# Patient Record
Sex: Male | Born: 1965
Health system: Southern US, Community
[De-identification: ages and names within clinical notes are randomized; demographics above are authoritative.]

## PROBLEM LIST (undated history)

## (undated) DIAGNOSIS — R413 Other amnesia: Secondary | ICD-10-CM

## (undated) DIAGNOSIS — M1990 Unspecified osteoarthritis, unspecified site: Secondary | ICD-10-CM

## (undated) DIAGNOSIS — B20 Human immunodeficiency virus [HIV] disease: Secondary | ICD-10-CM

## (undated) DIAGNOSIS — B159 Hepatitis A without hepatic coma: Secondary | ICD-10-CM

## (undated) DIAGNOSIS — Z923 Personal history of irradiation: Secondary | ICD-10-CM

## (undated) DIAGNOSIS — R7989 Other specified abnormal findings of blood chemistry: Secondary | ICD-10-CM

## (undated) DIAGNOSIS — E119 Type 2 diabetes mellitus without complications: Secondary | ICD-10-CM

## (undated) DIAGNOSIS — F32A Depression, unspecified: Secondary | ICD-10-CM

## (undated) DIAGNOSIS — G629 Polyneuropathy, unspecified: Secondary | ICD-10-CM

## (undated) DIAGNOSIS — E785 Hyperlipidemia, unspecified: Secondary | ICD-10-CM

## (undated) DIAGNOSIS — F4310 Post-traumatic stress disorder, unspecified: Secondary | ICD-10-CM

## (undated) DIAGNOSIS — C8190 Hodgkin lymphoma, unspecified, unspecified site: Secondary | ICD-10-CM

## (undated) DIAGNOSIS — K625 Hemorrhage of anus and rectum: Secondary | ICD-10-CM

## (undated) DIAGNOSIS — F419 Anxiety disorder, unspecified: Secondary | ICD-10-CM

## (undated) DIAGNOSIS — T148XXA Other injury of unspecified body region, initial encounter: Secondary | ICD-10-CM

## (undated) DIAGNOSIS — R634 Abnormal weight loss: Secondary | ICD-10-CM

## (undated) DIAGNOSIS — I10 Essential (primary) hypertension: Secondary | ICD-10-CM

## (undated) DIAGNOSIS — E781 Pure hyperglyceridemia: Secondary | ICD-10-CM

## (undated) HISTORY — DX: Pure hyperglyceridemia: E78.1

## (undated) HISTORY — DX: Other specified abnormal findings of blood chemistry: R79.89

## (undated) HISTORY — DX: Abnormal weight loss: R63.4

## (undated) HISTORY — DX: Essential (primary) hypertension: I10

## (undated) HISTORY — PX: TONSILLECTOMY: SUR1361

## (undated) HISTORY — DX: Type 2 diabetes mellitus without complications: E11.9

## (undated) HISTORY — DX: Other amnesia: R41.3

## (undated) HISTORY — DX: Other injury of unspecified body region, initial encounter: T14.8XXA

## (undated) HISTORY — DX: Hepatitis a without hepatic coma: B15.9

## (undated) HISTORY — DX: Post-traumatic stress disorder, unspecified: F43.10

## (undated) HISTORY — DX: Anxiety disorder, unspecified: F41.9

## (undated) HISTORY — DX: Personal history of irradiation: Z92.3

## (undated) HISTORY — DX: Unspecified osteoarthritis, unspecified site: M19.90

## (undated) HISTORY — DX: Depression, unspecified: F32.A

## (undated) HISTORY — DX: Hodgkin lymphoma, unspecified, unspecified site: C81.90

## (undated) HISTORY — DX: Hyperlipidemia, unspecified: E78.5

## (undated) HISTORY — DX: Hemorrhage of anus and rectum: K62.5

## (undated) HISTORY — PX: OTHER SURGICAL HISTORY: SHX169

## (undated) HISTORY — DX: Human immunodeficiency virus (HIV) disease: B20

## (undated) HISTORY — DX: Polyneuropathy, unspecified: G62.9

---

## 2000-11-14 ENCOUNTER — Observation Stay (HOSPITAL_COMMUNITY): Admission: EM | Admit: 2000-11-14 | Discharge: 2000-11-15 | Payer: Self-pay | Admitting: Emergency Medicine

## 2000-11-14 ENCOUNTER — Encounter: Payer: Self-pay | Admitting: Emergency Medicine

## 2009-10-26 LAB — CONVERTED CEMR LAB
CD4 T Helper %: 14.1 %
Hemoglobin: 14.4 g/dL
Platelets: 247 10*3/uL

## 2009-11-30 ENCOUNTER — Ambulatory Visit: Payer: Self-pay | Admitting: Internal Medicine

## 2009-11-30 DIAGNOSIS — B20 Human immunodeficiency virus [HIV] disease: Secondary | ICD-10-CM | POA: Insufficient documentation

## 2009-11-30 DIAGNOSIS — I10 Essential (primary) hypertension: Secondary | ICD-10-CM

## 2009-11-30 DIAGNOSIS — G4700 Insomnia, unspecified: Secondary | ICD-10-CM | POA: Insufficient documentation

## 2009-11-30 DIAGNOSIS — R634 Abnormal weight loss: Secondary | ICD-10-CM | POA: Insufficient documentation

## 2009-11-30 DIAGNOSIS — F17200 Nicotine dependence, unspecified, uncomplicated: Secondary | ICD-10-CM | POA: Insufficient documentation

## 2009-11-30 DIAGNOSIS — F341 Dysthymic disorder: Secondary | ICD-10-CM | POA: Insufficient documentation

## 2009-11-30 HISTORY — DX: Essential (primary) hypertension: I10

## 2009-11-30 HISTORY — DX: Human immunodeficiency virus (HIV) disease: B20

## 2009-11-30 LAB — CONVERTED CEMR LAB
ALT: 40 units/L (ref 0–53)
AST: 44 units/L — ABNORMAL HIGH (ref 0–37)
Albumin: 4.3 g/dL (ref 3.5–5.2)
Alkaline Phosphatase: 55 units/L (ref 39–117)
BUN: 13 mg/dL (ref 6–23)
Basophils Absolute: 0 10*3/uL (ref 0.0–0.1)
Basophils Relative: 1 % (ref 0–1)
Bilirubin Urine: NEGATIVE
CO2: 18 meq/L — ABNORMAL LOW (ref 19–32)
Calcium: 9.4 mg/dL (ref 8.4–10.5)
Chlamydia, Swab/Urine, PCR: NEGATIVE
Chloride: 104 meq/L (ref 96–112)
Cholesterol: 143 mg/dL (ref 0–200)
Creatinine, Ser: 0.89 mg/dL (ref 0.40–1.50)
Eosinophils Absolute: 0.1 10*3/uL (ref 0.0–0.7)
Eosinophils Relative: 2 % (ref 0–5)
GC Probe Amp, Urine: NEGATIVE
Glucose, Bld: 98 mg/dL (ref 70–99)
HCT: 43.2 % (ref 39.0–52.0)
HCV Ab: NEGATIVE
HDL: 26 mg/dL — ABNORMAL LOW (ref >39)
HIV 1 RNA Quant: 19100 copies/mL — ABNORMAL HIGH (ref <48)
HIV-1 RNA Quant, Log: 4.28 — ABNORMAL HIGH (ref <1.68)
HIV-1 antibody: POSITIVE — AB
HIV-2 Ab: NEGATIVE
HIV: REACTIVE
Hemoglobin, Urine: NEGATIVE
Hemoglobin: 14.2 g/dL (ref 13.0–17.0)
Hep A Total Ab: POSITIVE — AB
Hep B Core Total Ab: POSITIVE — AB
Hep B S Ab: POSITIVE — AB
Hepatitis B Surface Ag: NEGATIVE
Ketones, ur: NEGATIVE mg/dL
LDL Cholesterol: 53 mg/dL (ref 0–99)
Leukocytes, UA: NEGATIVE
Lymphocytes Relative: 50 % — ABNORMAL HIGH (ref 12–46)
Lymphs Abs: 3.2 10*3/uL (ref 0.7–4.0)
MCHC: 32.9 g/dL (ref 30.0–36.0)
MCV: 89.3 fL (ref 78.0–100.0)
Monocytes Absolute: 0.5 10*3/uL (ref 0.1–1.0)
Monocytes Relative: 8 % (ref 3–12)
Neutro Abs: 2.5 10*3/uL (ref 1.7–7.7)
Neutrophils Relative %: 40 % — ABNORMAL LOW (ref 43–77)
Nitrite: NEGATIVE
Platelets: 257 10*3/uL (ref 150–400)
Potassium: 4.8 meq/L (ref 3.5–5.3)
Protein, ur: NEGATIVE mg/dL
RBC: 4.84 M/uL (ref 4.22–5.81)
RDW: 13.7 % (ref 11.5–15.5)
Sodium: 138 meq/L (ref 135–145)
Specific Gravity, Urine: 1.017 (ref 1.005–1.030)
Total Bilirubin: 0.4 mg/dL (ref 0.3–1.2)
Total CHOL/HDL Ratio: 5.5
Total Protein: 7.8 g/dL (ref 6.0–8.3)
Triglycerides: 322 mg/dL — ABNORMAL HIGH (ref <150)
Urine Glucose: NEGATIVE mg/dL
Urobilinogen, UA: 0.2 (ref 0.0–1.0)
VLDL: 64 mg/dL — ABNORMAL HIGH (ref 0–40)
WBC: 6.3 10*3/uL (ref 4.0–10.5)
pH: 5 (ref 5.0–8.0)

## 2009-12-15 ENCOUNTER — Ambulatory Visit: Payer: Self-pay | Admitting: Internal Medicine

## 2009-12-29 ENCOUNTER — Ambulatory Visit: Payer: Self-pay | Admitting: Internal Medicine

## 2009-12-29 DIAGNOSIS — H6690 Otitis media, unspecified, unspecified ear: Secondary | ICD-10-CM | POA: Insufficient documentation

## 2010-02-28 ENCOUNTER — Telehealth: Payer: Self-pay | Admitting: Internal Medicine

## 2010-02-28 ENCOUNTER — Ambulatory Visit: Payer: Self-pay | Admitting: Internal Medicine

## 2010-02-28 LAB — CONVERTED CEMR LAB
ALT: 33 units/L (ref 0–53)
AST: 33 units/L (ref 0–37)
Albumin: 4.1 g/dL (ref 3.5–5.2)
Alkaline Phosphatase: 51 units/L (ref 39–117)
BUN: 12 mg/dL (ref 6–23)
Basophils Absolute: 0 10*3/uL (ref 0.0–0.1)
Basophils Relative: 0 % (ref 0–1)
CO2: 26 meq/L (ref 19–32)
Calcium: 9.3 mg/dL (ref 8.4–10.5)
Chloride: 103 meq/L (ref 96–112)
Creatinine, Ser: 0.89 mg/dL (ref 0.40–1.50)
Eosinophils Absolute: 0.1 10*3/uL (ref 0.0–0.7)
Eosinophils Relative: 3 % (ref 0–5)
Glucose, Bld: 134 mg/dL — ABNORMAL HIGH (ref 70–99)
HCT: 43.7 % (ref 39.0–52.0)
HIV 1 RNA Quant: 26800 copies/mL — ABNORMAL HIGH (ref <48)
HIV-1 RNA Quant, Log: 4.43 — ABNORMAL HIGH (ref <1.68)
Hemoglobin: 14.4 g/dL (ref 13.0–17.0)
Lymphocytes Relative: 50 % — ABNORMAL HIGH (ref 12–46)
Lymphs Abs: 2.9 10*3/uL (ref 0.7–4.0)
MCHC: 33 g/dL (ref 30.0–36.0)
MCV: 89.2 fL (ref 78.0–100.0)
Monocytes Absolute: 0.5 10*3/uL (ref 0.1–1.0)
Monocytes Relative: 8 % (ref 3–12)
Neutro Abs: 2.2 10*3/uL (ref 1.7–7.7)
Neutrophils Relative %: 39 % — ABNORMAL LOW (ref 43–77)
Platelets: 210 10*3/uL (ref 150–400)
Potassium: 4.3 meq/L (ref 3.5–5.3)
RBC: 4.9 M/uL (ref 4.22–5.81)
RDW: 13.8 % (ref 11.5–15.5)
Sodium: 139 meq/L (ref 135–145)
Total Bilirubin: 0.4 mg/dL (ref 0.3–1.2)
Total Protein: 7.5 g/dL (ref 6.0–8.3)
WBC: 5.7 10*3/uL (ref 4.0–10.5)

## 2010-03-21 ENCOUNTER — Ambulatory Visit: Payer: Self-pay | Admitting: Infectious Disease

## 2010-06-20 ENCOUNTER — Telehealth: Payer: Self-pay | Admitting: Internal Medicine

## 2010-06-20 ENCOUNTER — Ambulatory Visit: Payer: Self-pay | Admitting: Internal Medicine

## 2010-06-20 LAB — CONVERTED CEMR LAB
ALT: 30 units/L (ref 0–53)
AST: 28 units/L (ref 0–37)
Albumin: 4.4 g/dL (ref 3.5–5.2)
Alkaline Phosphatase: 52 units/L (ref 39–117)
BUN: 13 mg/dL (ref 6–23)
Basophils Absolute: 0 10*3/uL (ref 0.0–0.1)
Basophils Relative: 0 % (ref 0–1)
CO2: 29 meq/L (ref 19–32)
Calcium: 9.6 mg/dL (ref 8.4–10.5)
Chloride: 101 meq/L (ref 96–112)
Creatinine, Ser: 1.02 mg/dL (ref 0.40–1.50)
Eosinophils Absolute: 0.2 10*3/uL (ref 0.0–0.7)
Eosinophils Relative: 3 % (ref 0–5)
Glucose, Bld: 106 mg/dL — ABNORMAL HIGH (ref 70–99)
HCT: 43.5 % (ref 39.0–52.0)
HIV 1 RNA Quant: <20 copies/mL (ref <20)
HIV-1 RNA Quant, Log: <1.3 (ref <1.30)
Hemoglobin: 14.6 g/dL (ref 13.0–17.0)
Lymphocytes Relative: 51 % — ABNORMAL HIGH (ref 12–46)
Lymphs Abs: 4 10*3/uL (ref 0.7–4.0)
MCHC: 33.6 g/dL (ref 30.0–36.0)
MCV: 89.3 fL (ref 78.0–100.0)
Monocytes Absolute: 0.6 10*3/uL (ref 0.1–1.0)
Monocytes Relative: 7 % (ref 3–12)
Neutro Abs: 3.2 10*3/uL (ref 1.7–7.7)
Neutrophils Relative %: 40 % — ABNORMAL LOW (ref 43–77)
Platelets: 243 10*3/uL (ref 150–400)
Potassium: 4.1 meq/L (ref 3.5–5.3)
RBC: 4.87 M/uL (ref 4.22–5.81)
RDW: 13.6 % (ref 11.5–15.5)
Sodium: 140 meq/L (ref 135–145)
Total Bilirubin: 0.4 mg/dL (ref 0.3–1.2)
Total Protein: 7.9 g/dL (ref 6.0–8.3)
WBC: 8 10*3/uL (ref 4.0–10.5)

## 2010-07-04 ENCOUNTER — Telehealth (INDEPENDENT_AMBULATORY_CARE_PROVIDER_SITE_OTHER): Payer: Self-pay | Admitting: *Deleted

## 2010-07-05 ENCOUNTER — Ambulatory Visit: Payer: Self-pay | Admitting: Internal Medicine

## 2010-07-07 ENCOUNTER — Encounter (INDEPENDENT_AMBULATORY_CARE_PROVIDER_SITE_OTHER): Payer: Self-pay | Admitting: Licensed Clinical Social Worker

## 2010-08-30 NOTE — Consult Note (Signed)
Summary: New Pt. Referral: [facility] Dept.  New Pt. Referral: [facility] Dept.   Imported By: Florinda Marker 12/30/2009 15:01:50  _____________________________________________________________________  External Attachment:    Type:   Image     Comment:   External Document
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
## 2010-08-30 NOTE — Progress Notes (Signed)
Summary: recheck RPR  Phone Note Call from Patient   Caller: Patient Reason for Call: Talk to Nurse Summary of Call: Pt. was here for lab work and said his primary care physician wanted an RPR rechecked at six months, since he had labs today could that be added? Initial call taken by: Wendall Mola CMA Duncan Dull),  February 28, 2010 9:21 AM  Follow-up for Phone Call        he was negative in May Follow-up by: Yisroel Ramming MD,  February 28, 2010 9:26 AM  Additional Follow-up for Phone Call Additional follow up Details #1::        pt. notified Additional Follow-up by: Wendall Mola CMA Duncan Dull),  February 28, 2010 9:38 AM

## 2010-08-30 NOTE — Miscellaneous (Signed)
  Clinical Lists Changes  Medications: Added new medication of FAMCICLOVIR 250 MG TABS (FAMCICLOVIR)

## 2010-08-30 NOTE — Progress Notes (Addendum)
Summary: PAP application for Isentress  Phone Note Outgoing Call   Call placed by: Annice Pih Summary of Call: [facility] for pt. assistance for Isentress. Was told pt. needed to fill out a new application and RX needed to be mailed.  Pt. has appt. 07/05/10 and he can fill out application at that time. Initial call taken by: Wendall Mola CMA [facility]),  July 04, 2010 11:48 AM     Appended Document: PAP application for Isentress pt. picked up PA for Isentress

## 2010-08-30 NOTE — Progress Notes (Signed)
Summary: Isentress refill for PAP  Phone Note Other Incoming   Caller: [employer] program for Isentress,  Summary of Call: Needing new printed and signed rx to continue PAP rx. [name] RN  June 20, 2010 12:39 PM     Prescriptions: ISENTRESS 400 MG TABS (RALTEGRAVIR POTASSIUM) Take 1 tablet by mouth two times a day  #60 x prn   Entered by:   [name] RN   Authorized by:   Yisroel Ramming MD   Signed by:   [name] RN on 06/20/2010   Method used:   Print then Give to Patient   RxID:   [id number]  to mail to PAP program, [facility].  Fax [phone], tel [phone] [name] RN  June 20, 2010 12:40 PM
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
## 2010-08-30 NOTE — Assessment & Plan Note (Signed)
Summary: SORE THROAT/VS   CC:  pt. c/o sorethroat and left ear blocked and headache x one week.  History of Present Illness: Pt c/o about a week of a sore throat and ear pain bilaterally.  He feels like his left ear is clogged and  hears some ringing in it.  He has a cough but thinks it is due to postnasal drip.  No fever or chills.  He has been using mucinex.  Preventive Screening-Counseling & Management  Alcohol-Tobacco     Alcohol drinks/day: <1     Smoking Status: occasional  Caffeine-Diet-Exercise     Caffeine use/day: coffee and tea     Does Patient Exercise: yes     Type of exercise: treadmill, weights     Times/week: 5  Safety-Violence-Falls     Seat Belt Use: yes      Sexual History:  no.        Drug Use:  No.     Updated Prior Medication List: LEXAPRO 10 MG TABS (ESCITALOPRAM OXALATE) take one daily ZIAC 2.5-6.25 MG TABS (BISOPROLOL-HYDROCHLOROTHIAZIDE) take one daily CLARITIN 10 MG TABS (LORATADINE) take one daily ASPIR-LOW 81 MG TBEC (ASPIRIN) take one daily FISH OIL 1000 MG CAPS (OMEGA-3 FATTY ACIDS)  VITAMIN C CR 500 MG CR-TABS (ASCORBIC ACID)  DOXEPIN HCL 10 MG CAPS (DOXEPIN HCL) take one capsule at bedtime as needed for sleep AUGMENTIN 875-125 MG TABS (AMOXICILLIN-POT CLAVULANATE) Take 1 tablet by mouth two times a day  Current Allergies (reviewed today): No known allergies  Past History:  Past Medical History: Last updated: 12/15/2009 Hypertension  Review of Systems  The patient denies anorexia, fever, chest pain, dyspnea on exertion, and hemoptysis.    Vital Signs:  Patient profile:   45 year old male Height:      72 inches (182.88 cm) Weight:      297.8 pounds (135.36 kg) BMI:     40.53 Temp:     98.0 degrees F (36.67 degrees C) oral Pulse rate:   81 / minute BP sitting:   149 / 88  (left arm)  Vitals Entered By: Wendall Mola CMA Duncan Dull) (December 29, 2009 4:01 PM) CC: pt. c/o sorethroat, left ear blocked and headache x one week Is  Patient Diabetic? No Pain Assessment Patient in pain? yes     Location: head Intensity: 4 Type: aching Onset of pain  Constant Nutritional Status BMI of > 30 = obese Nutritional Status Detail appetite "good"  Does patient need assistance? Functional Status Self care Ambulation Normal Comments no missed doses of meds per patient   Physical Exam  General:  alert, well-developed, well-nourished, and well-hydrated.   Head:  normocephalic and atraumatic.   Ears:  R and L TM slightly erythematous and dull Mouth:  no exudates and pharyngeal erythema.   Lungs:  normal breath sounds.     Impression & Recommendations:  Problem # 1:  OTITIS MEDIA, ACUTE (ICD-382.9) Will treat with augmentin His updated medication list for this problem includes:        Augmentin 875-125 Mg Tabs (Amoxicillin-pot clavulanate) .Marland Kitchen... Take 1 tablet by mouth two times a day  Orders: Est. Patient Research Study [id number])  Medications Added to Medication List This Visit: 1)  Augmentin 875-125 Mg Tabs (Amoxicillin-pot clavulanate) .... Take 1 tablet by mouth two times a day Prescriptions: AUGMENTIN 875-125 MG TABS (AMOXICILLIN-POT CLAVULANATE) Take 1 tablet by mouth two times a day  #20 x 0   Entered and Authorized by:   Tresa Endo  Huckleberry Martinson MD   Signed by:   Yisroel Ramming MD on 12/29/2009   Method used:   Print then Give to Patient   RxID:   [id number]

## 2010-08-30 NOTE — Miscellaneous (Signed)
Summary: HIPAA Restrictions  HIPAA Restrictions   Imported By: Florinda Marker 11/30/2009 15:52:24  _____________________________________________________________________  External Attachment:    Type:   Image     Comment:   External Document

## 2010-09-12 ENCOUNTER — Encounter (INDEPENDENT_AMBULATORY_CARE_PROVIDER_SITE_OTHER): Payer: Self-pay | Admitting: *Deleted

## 2010-09-15 ENCOUNTER — Encounter: Payer: Self-pay | Admitting: Adult Health

## 2010-09-19 ENCOUNTER — Telehealth (INDEPENDENT_AMBULATORY_CARE_PROVIDER_SITE_OTHER): Payer: Self-pay | Admitting: *Deleted

## 2010-09-21 ENCOUNTER — Other Ambulatory Visit (INDEPENDENT_AMBULATORY_CARE_PROVIDER_SITE_OTHER): Payer: 59

## 2010-09-21 ENCOUNTER — Encounter: Payer: Self-pay | Admitting: Adult Health

## 2010-09-21 ENCOUNTER — Other Ambulatory Visit: Payer: Self-pay | Admitting: Adult Health

## 2010-09-21 DIAGNOSIS — B20 Human immunodeficiency virus [HIV] disease: Secondary | ICD-10-CM

## 2010-09-21 LAB — CONVERTED CEMR LAB
Albumin: 4.3 g/dL (ref 3.5–5.2)
Alkaline Phosphatase: 53 units/L (ref 39–117)
Basophils Absolute: 0 10*3/uL (ref 0.0–0.1)
Basophils Relative: 0 % (ref 0–1)
Calcium: 9 mg/dL (ref 8.4–10.5)
Chloride: 99 meq/L (ref 96–112)
Eosinophils Absolute: 0.1 10*3/uL (ref 0.0–0.7)
Glucose, Bld: 102 mg/dL — ABNORMAL HIGH (ref 70–99)
HIV 1 RNA Quant: <20 copies/mL (ref <20)
HIV-1 RNA Quant, Log: <1.3 (ref <1.30)
MCHC: 33.4 g/dL (ref 30.0–36.0)
MCV: 90.6 fL (ref 78.0–100.0)
Monocytes Absolute: 0.9 10*3/uL (ref 0.1–1.0)
Monocytes Relative: 10 % (ref 3–12)
Neutrophils Relative %: 50 % (ref 43–77)
RBC: 4.46 M/uL (ref 4.22–5.81)
Sodium: 137 meq/L (ref 135–145)
Total Bilirubin: 0.5 mg/dL (ref 0.3–1.2)
Total Protein: 7.2 g/dL (ref 6.0–8.3)

## 2010-09-21 NOTE — Miscellaneous (Signed)
  Clinical Lists Changes  Observations: Added new observation of HOUSING: Stable/permanent (09/12/2010 15:51)

## 2010-09-22 LAB — T-HELPER CELL (CD4) - (RCID CLINIC ONLY)
CD4 % Helper T Cell: 23 % — ABNORMAL LOW (ref 33–55)
CD4 T Cell Abs: 720 uL (ref 400–2700)

## 2010-09-27 NOTE — Progress Notes (Signed)
Summary: PAP Isentress for pick-up.  Phone Note Outgoing Call   Call placed by: [name] RN,  September 19, 2010 10:26 AM Call placed to: Patient Action Taken: Assistance medications ready for pick up Summary of Call: Pt. stated he will pick up on Wed., Feb. 22, 2012 when he comes in for his lab work. [name] RN  September 19, 2010 10:33 AM     Prescriptions: ISENTRESS 400 MG TABS (RALTEGRAVIR POTASSIUM) Take 1 tablet by mouth two times a day  #60 x 2   Entered by:   [name] RN   Authorized by:   Johny Sax MD   Signed by:   [name] RN on 09/19/2010   Method used:   Samples Given   RxID:   [id number]  Discard after 09/13/2011. [name] RN  September 19, 2010 10:32 AM

## 2010-09-27 NOTE — Letter (Signed)
Summary: Support Program  Support Program   Imported By: Florinda Marker 09/21/2010 09:46:18  _____________________________________________________________________  External Attachment:    Type:   Image     Comment:   External Document

## 2010-10-05 ENCOUNTER — Encounter: Payer: Self-pay | Admitting: Infectious Diseases

## 2010-10-05 ENCOUNTER — Ambulatory Visit (INDEPENDENT_AMBULATORY_CARE_PROVIDER_SITE_OTHER): Payer: 59 | Admitting: Infectious Diseases

## 2010-10-05 DIAGNOSIS — B20 Human immunodeficiency virus [HIV] disease: Secondary | ICD-10-CM

## 2010-10-10 ENCOUNTER — Telehealth (INDEPENDENT_AMBULATORY_CARE_PROVIDER_SITE_OTHER): Payer: Self-pay | Admitting: *Deleted

## 2010-10-11 NOTE — Assessment & Plan Note (Signed)
Summary: new to md 50month f/u [mkj]   Vital Signs:  Patient profile:   45 year old male Height:      72 inches (182.88 cm) Weight:      296 pounds (134.55 kg) BMI:     40.29 Temp:     98.9 degrees F (37.17 degrees C) oral Pulse rate:   79 / minute BP sitting:   148 / 89  (left arm)  Vitals Entered By: Starleen Arms CMA (October 05, 2010 3:27 PM) CC: 3 month f/u Is Patient Diabetic? No Pain Assessment Patient in pain? no      Nutritional Status BMI of > 30 = obese Nutritional Status Detail eating more  Does patient need assistance? Functional Status Self care Ambulation Normal   Primary Provider:  Vito Berger MD  CC:  3 month f/u.  History of Present Illness: 45 yo M with HIV+ 09-2009 as well as G190A mutation. He was started on ISN/TRV at f/u. Last CD4 720 VL <20 (09-21-2010). Has been taking Zi+ and Vitamin D3 as well. Was recently treated for pneumonia (tamiflu, antibiotics and 2 inhalers).   Preventive Screening-Counseling & Management  Alcohol-Tobacco     Alcohol drinks/day: 0     Smoking Status: never  Current Medications (verified): 1)  Lexapro 10 Mg Tabs (Escitalopram Oxalate) .... Take One Daily 2)  Ziac 5-6.25 Mg Tabs (Bisoprolol-Hydrochlorothiazide) .Marland Kitchen.. 1 Daily 3)  Claritin 10 Mg Tabs (Loratadine) .... Take One Daily 4)  Aspir-Low 81 Mg Tbec (Aspirin) .... Take One Daily 5)  Vitamin C Cr 500 Mg Cr-Tabs (Ascorbic Acid) 6)  Doxepin Hcl 10 Mg Caps (Doxepin Hcl) .... Take One Capsule At Bedtime As Needed For Sleep 7)  Truvada 200-300 Mg Tabs (Emtricitabine-Tenofovir) .... Take 1 Tablet By Mouth Once A Day 8)  Isentress 400 Mg Tabs (Raltegravir Potassium) .... Take 1 Tablet By Mouth Two Times A Day  Allergies: 1)  ! * Shrimp and Lobster 2)  ! Malon Kindle  Past History:  Past Medical History: Current Problems:  OTITIS MEDIA, ACUTE (ICD-382.9) WEIGHT LOSS, RECENT (ICD-783.21) INSOMNIA (ICD-780.52) SMOKER (ICD-305.1) ANXIETY DEPRESSION  (ICD-300.4) HYPERTENSION (ICD-401.9) HIV INFECTION (ICD-042) ENCOUNTER FOR LONG-TERM USE OF OTHER MEDICATIONS (ICD-V58.69)  Family History: father with brain tumors, CVA  Social History: single, quit tobacco and ETOH  Review of Systems       wt is down 5 # from previous. no change in diet. having frontal headache.   Physical Exam  General:  well-developed, well-nourished, well-hydrated, and overweight-appearing.   Eyes:  pupils equal, pupils round, and pupils reactive to light.   Mouth:  pharynx pink and moist and no exudates.   Neck:  no masses.   Lungs:  normal respiratory effort and normal breath sounds.   Heart:  normal rate, regular rhythm, and no murmur.   Abdomen:  soft, non-tender, and normal bowel sounds.          Medication Adherence: 10/05/2010   Adherence to medications reviewed with patient. Counseling to provide adequate adherence provided   Prevention For Positives: 10/05/2010   Safe sex practices discussed with patient. Condoms offered.                             Impression & Recommendations:  Problem # 1:  HIV INFECTION (ICD-042)  he is doing very well. no change in his meds. offered condoms. will see him back in 4-5 months with labs before hand. RW  form updated.   The following medications were removed from the medication list:    Famciclovir 250 Mg Tabs (Famciclovir)  Orders: Est. Patient Level IV (99214)Future Orders: T-CD4SP ([facility]) (CD4SP) ... 01/03/2011 T-HIV Viral Load [phone]) ... 01/03/2011 T-Comprehensive Metabolic Panel [phone]) ... 01/03/2011 T-CBC w/Diff ([id number]) ... 01/03/2011 T-RPR (Syphilis) [phone]) ... 01/03/2011 T-Lipid Profile [phone]) ... 01/03/2011  Problem # 2:  HYPERTENSION (ICD-401.9) he is going to go back to the gym as he gets over his pneumonia. got bp medicine refilled 2 days ago, did not run out. he has been followed at [facility].  His updated medication list for this  problem includes:    Ziac 5-6.25 Mg Tabs (Bisoprolol-hydrochlorothiazide) .Marland Kitchen... 1 daily  Medications Added to Medication List This Visit: 1)  Ziac 5-6.25 Mg Tabs (Bisoprolol-hydrochlorothiazide) .Marland Kitchen.. 1 daily 2)  Zinc Sulfate 220 Mg Tabs (Zinc sulfate) .... Dose unknown 3)  Vitamin D3 1000 Unit Tabs (Cholecalciferol) .... Take 1 tablet by mouth once a day    Orders Added: 1)  T-CD4SP [facility]) [CD4SP] 2)  T-HIV Viral Load [phone] 3)  T-Comprehensive Metabolic Panel [[id number]] 4)  T-CBC w/Diff [[id number]] 5)  T-RPR (Syphilis) [[id number]] 6)  T-Lipid Profile [[id number]] 7)  Est. Patient Level IV [51884]

## 2010-10-14 LAB — T-HELPER CELL (CD4) - (RCID CLINIC ONLY): CD4 % Helper T Cell: 16 % — ABNORMAL LOW (ref 33–55)

## 2010-10-18 LAB — T-HELPER CELL (CD4) - (RCID CLINIC ONLY): CD4 T Cell Abs: 530 uL (ref 400–2700)

## 2010-10-18 NOTE — Progress Notes (Signed)
Summary: PAP arrived  Phone Note Outgoing Call   Call placed by: [name] RN,  October 10, 2010 12:18 PM Call placed to: Patient Action Taken: Assistance medications ready for pick up Summary of Call: Isentressm PAP arrived.  Exp 10/07/2011.   Arrived from [employer].  Pt. informed.  will pick up when he comes in for his next visit.  [name] RN  October 10, 2010 12:23 PM

## 2010-11-10 ENCOUNTER — Other Ambulatory Visit: Payer: Self-pay | Admitting: *Deleted

## 2010-12-16 NOTE — H&P (Signed)
[place]  Patient:    Jon Shannon, Jon Shannon                      MRN: [id number] Adm. Date:  [id number] Attending:  Ilene Qua CC:         [facility] in [place]   History and Physical  HISTORY OF PRESENT ILLNESS:  This 45 year old gentleman is admitted with chest pain. He had onset of substernal chest discomfort as he was walking across the parking lot to get to his truck to go to lunch today. It was of gradual onset. It was associated with feeling hot and sweaty. He did not have any nausea until after he arrived at the emergency room, and IV nitroglycerin was started. He had difficulty taking a deep breath because of the chest pain. There was no radiation to the arm, jaw, or back. He does not have any prior history of angina or heart problem, and he exercises regularly.  FAMILY HISTORY:  His father is living at age 72 and is in good health, except for being a diabetic. Mother is in good health, living and well at 40. There is no history of premature coronary disease in the family.  SOCIAL HISTORY:  He is single. 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 He previously was a two-pack-a-day smoker until about 2 years ago and now rarely smokes any at all. He does not drink any alcohol. He does drink moderate caffeine. He works out 6 days a week at one of the [employer] facilities using [employer] and treadmill.  REVIEW OF SYSTEMS:  He has not had any history of diabetes or thyroid trouble. Denies any bronchitis. He has had no history of cardiovascular problems. He did have a previous remote diagnosis of peptic ulcer, and he does have a history of occasional dyspepsia. He denies any genitourinary symptoms. Remainder of review of systems is negative in detail.  PHYSICAL EXAMINATION:  VITAL SIGNS:  Blood pressure is 100/60, pulse is 80 and regular,  respirations are normal.  HEENT/NECK:  Color is good. Jugular venous pressure normal. Carotids normal.  CHEST:  Clear.  HEART:  No murmur, gallop, rub, or click.  ABDOMEN:  Soft and nontender.  EXTREMITIES:  Good peripheral pulses and no phlebitis or edema.  LABORATORY DATA:  Chest x-ray is no active disease.  His electrocardiogram shows normal sinus rhythm, poor R wave progression V1 through V3. No acute changes.  Initial CK total is slightly elevated, but troponin I is negative. The CK-MB is normal at 2.8.  IMPRESSION:  Chest pain, rule out myocardial infarction, rule out noncardiac causes.  DISPOSITION:  He is being admitted to telemetry for observation status. Serial enzymes and EKGs will be obtained. He will be treated empirically with IV nitroglycerin, IV heparin, beta blocker, and aspirin. We will consider possible Cardiolite stress test in a.m. depending on clinical course. DD:  11/14/00 TD:  11/14/00 Job: [id number]

## 2011-02-02 ENCOUNTER — Telehealth: Payer: Self-pay | Admitting: *Deleted

## 2011-02-02 NOTE — Telephone Encounter (Signed)
Pt called with several questions.  1)  GERD symptoms unrelieved by Pepcid OTC.  RN advised changing to generic Prilosec OTC per the package instructions.  2)  Rash on chest and abdomen after starting workouts at gym.  RN discussed laundry instructions, shower instructions and advised wiping down equipment at the gym due to pt with h/o MRSA.  3)  Question about weight gain since last visit.  RN recommended discussing this w/ MD at upcoming OV.  Pt verbalized understanding.  [name], RN

## 2011-02-15 ENCOUNTER — Other Ambulatory Visit: Payer: Self-pay | Admitting: Infectious Diseases

## 2011-02-15 ENCOUNTER — Other Ambulatory Visit: Payer: 59

## 2011-02-15 DIAGNOSIS — B20 Human immunodeficiency virus [HIV] disease: Secondary | ICD-10-CM

## 2011-02-15 DIAGNOSIS — Z79899 Other long term (current) drug therapy: Secondary | ICD-10-CM

## 2011-02-15 DIAGNOSIS — Z113 Encounter for screening for infections with a predominantly sexual mode of transmission: Secondary | ICD-10-CM

## 2011-02-15 LAB — CBC WITH DIFFERENTIAL/PLATELET
Basophils Relative: 0 % (ref 0–1)
Hemoglobin: 14.9 g/dL (ref 13.0–17.0)
Lymphs Abs: 2.9 10*3/uL (ref 0.7–4.0)
MCHC: 33.6 g/dL (ref 30.0–36.0)
Monocytes Relative: 9 % (ref 3–12)
Neutro Abs: 2.6 10*3/uL (ref 1.7–7.7)
Neutrophils Relative %: 43 % (ref 43–77)
RBC: 4.92 MIL/uL (ref 4.22–5.81)

## 2011-02-15 LAB — LIPID PANEL
Cholesterol: 159 mg/dL (ref 0–200)
Total CHOL/HDL Ratio: 5.9 Ratio

## 2011-02-16 LAB — COMPLETE METABOLIC PANEL WITH GFR
BUN: 14 mg/dL (ref 6–23)
CO2: 27 mEq/L (ref 19–32)
Creat: 1.01 mg/dL (ref 0.50–1.35)
GFR, Est African American: >60 mL/min (ref >60)
GFR, Est Non African American: >60 mL/min (ref >60)
Glucose, Bld: 96 mg/dL (ref 70–99)
Total Bilirubin: 0.4 mg/dL (ref 0.3–1.2)

## 2011-02-16 LAB — RPR

## 2011-02-16 LAB — T-HELPER CELL (CD4) - (RCID CLINIC ONLY): CD4 T Cell Abs: 900 uL (ref 400–2700)

## 2011-02-27 ENCOUNTER — Other Ambulatory Visit: Payer: Self-pay | Admitting: *Deleted

## 2011-02-27 DIAGNOSIS — B20 Human immunodeficiency virus [HIV] disease: Secondary | ICD-10-CM

## 2011-02-27 MED ORDER — EMTRICITABINE-TENOFOVIR DF 200-300 MG PO TABS: 1.0000 | ORAL_TABLET | Freq: Every day | ORAL | Status: DC

## 2011-02-27 MED ORDER — RALTEGRAVIR POTASSIUM 400 MG PO TABS: 400.0000 mg | ORAL_TABLET | Freq: Two times a day (BID) | ORAL | Status: DC

## 2011-03-01 ENCOUNTER — Encounter: Payer: Self-pay | Admitting: Infectious Diseases

## 2011-03-01 ENCOUNTER — Telehealth: Payer: Self-pay | Admitting: Infectious Diseases

## 2011-03-01 ENCOUNTER — Ambulatory Visit (INDEPENDENT_AMBULATORY_CARE_PROVIDER_SITE_OTHER): Payer: 59 | Admitting: Infectious Diseases

## 2011-03-01 DIAGNOSIS — R634 Abnormal weight loss: Secondary | ICD-10-CM

## 2011-03-01 DIAGNOSIS — R51 Headache: Secondary | ICD-10-CM

## 2011-03-01 DIAGNOSIS — F341 Dysthymic disorder: Secondary | ICD-10-CM

## 2011-03-01 DIAGNOSIS — B20 Human immunodeficiency virus [HIV] disease: Secondary | ICD-10-CM

## 2011-03-01 DIAGNOSIS — R21 Rash and other nonspecific skin eruption: Secondary | ICD-10-CM | POA: Insufficient documentation

## 2011-03-01 DIAGNOSIS — R519 Headache, unspecified: Secondary | ICD-10-CM

## 2011-03-01 MED ORDER — TESAMORELIN ACETATE 1 MG ~~LOC~~ SOLR: 2.0000 mg | Freq: Every day | SUBCUTANEOUS | Status: DC

## 2011-03-01 NOTE — Assessment & Plan Note (Signed)
He is doing very well. Will continue his current rx until qd, single tablet of integrase available. Offered condoms. Not active.

## 2011-03-01 NOTE — Progress Notes (Signed)
  Subjective:    Patient ID: Jon Shannon, male    DOB: 1966-04-17, 45 y.o.   MRN: [id number]  HPI 45 yo M with HIV+ 09-2009 as well as G190A mutation. He was started on ISN/TRV at f/u. Last CD4 900, VL <20, Trig 353 (02-15-2011).  Has been having a HA and rash on both sides of his neck. Gaining wt- is down 3# from previous. Has been working out, keeping food diary (keeping calories at 2135). Has ? About egrifta.    Review of Systems  Eyes: Positive for visual disturbance.  Gastrointestinal:       Gerd  Neurological: Positive for headaches.       Objective:   Physical Exam  Constitutional: He appears well-developed and well-nourished.  Eyes: EOM are normal. Pupils are equal, round, and reactive to light.  Cardiovascular: Normal rate and regular rhythm.   Pulmonary/Chest: Effort normal and breath sounds normal.  Abdominal: Soft. Bowel sounds are normal. There is no tenderness.  Skin: Rash noted.       Mild papular rash on his checks in beard at jaw line blilaterally. No pustules.           Assessment & Plan:

## 2011-03-01 NOTE — Assessment & Plan Note (Signed)
He is seeing a Veterinary surgeon. He has increased his lexapro to 10mg  qday.

## 2011-03-01 NOTE — Assessment & Plan Note (Signed)
He would like to try egrifta. Will write him a rx for this

## 2011-03-01 NOTE — Assessment & Plan Note (Signed)
He suspects these are related to worsening of his vision over the last 10 years. He is going to ophtho for eval.

## 2011-03-01 NOTE — Assessment & Plan Note (Signed)
Asked him to try topical hydrocortisone bid and to not shave for a week. Offered him derm eval but he defers.

## 2011-03-01 NOTE — Telephone Encounter (Signed)
Jon Shannon was brought to me today by Sherrye Payor for Prescription Assistance for Egrifta.  Jon Shannon has insurance with [employer] and his estimated co-pay is $33.33 for a 1 month supply and $100 for a 3 month supply.  With the co-pay assistance card, he could end up with no co-pay at all.  While he was here, he was concerned that he might not be able to self inject so when I called him back about the co-pay, I told him that the pharmacist could direct him with assistance and he can go online to the Egrifta site.  There is instructions he can download and a video he can watch.

## 2011-03-23 ENCOUNTER — Telehealth: Payer: Self-pay | Admitting: *Deleted

## 2011-03-23 NOTE — Telephone Encounter (Signed)
Pt had been reviewing Drugs.com for drug information r/t to his rxes.  He found that there was an interaction between Truvada and aspirin.  RN asked when he took these rxes.  He takes the Truvada at [facility] and the ASA in the AM.  RN advised that this was appropriate to avoid significant interaction.  [name], RN

## 2011-05-02 ENCOUNTER — Ambulatory Visit (INDEPENDENT_AMBULATORY_CARE_PROVIDER_SITE_OTHER): Payer: 59 | Admitting: *Deleted

## 2011-05-02 VITALS — Wt 306.0 lb

## 2011-05-02 DIAGNOSIS — Z23 Encounter for immunization: Secondary | ICD-10-CM

## 2011-05-02 DIAGNOSIS — B20 Human immunodeficiency virus [HIV] disease: Secondary | ICD-10-CM

## 2011-05-25 ENCOUNTER — Telehealth: Payer: Self-pay | Admitting: *Deleted

## 2011-05-25 NOTE — Telephone Encounter (Signed)
He was at a support group last night & questioned if he should have the anal pa. Told him I will forward this to md as I do not know when we are going to start doing them. He also asked about Prevnar. I told him he got the vaccine in 2011 so he is protected. Told him the shingles vaccine is not given to people who have the HIV infection as it is a live virus & could give him shingles. We do not give any live virus vaccines to people with HIV. He had no further questions. I told him I will call him when I hear back from the md

## 2011-05-31 NOTE — Telephone Encounter (Signed)
If his CD4 is >500 he can get shingles vaccine We can do anal pap at his next visit.

## 2011-05-31 NOTE — Telephone Encounter (Signed)
Told him what md response was

## 2011-07-18 ENCOUNTER — Telehealth: Payer: Self-pay | Admitting: Infectious Diseases

## 2011-07-18 NOTE — Telephone Encounter (Signed)
Received application for Jon Shannon's Isentress.  He has [employer].  Called to see if there was a deductible for his pharmacy plan.  There is not.  Has a co-pay.  I called Jon Shannon and told him that since he has insurance and a low co-pay we could not re-apply through the Support Program.  He is to come by my office when he comes in on the 7th of January and pick up a co-pay card for his Isentress.

## 2011-07-20 ENCOUNTER — Other Ambulatory Visit: Payer: 59

## 2011-07-20 ENCOUNTER — Telehealth: Payer: Self-pay | Admitting: Infectious Diseases

## 2011-07-20 ENCOUNTER — Other Ambulatory Visit: Payer: Self-pay | Admitting: Infectious Diseases

## 2011-07-20 DIAGNOSIS — B20 Human immunodeficiency virus [HIV] disease: Secondary | ICD-10-CM

## 2011-07-20 NOTE — Telephone Encounter (Signed)
Mr. Tomei came by and picked up his co-pay card for Isentress

## 2011-07-21 LAB — T-HELPER CELL (CD4) - (RCID CLINIC ONLY): CD4 % Helper T Cell: 30 % — ABNORMAL LOW (ref 33–55)

## 2011-07-21 LAB — COMPREHENSIVE METABOLIC PANEL
AST: 44 U/L — ABNORMAL HIGH (ref 0–37)
Albumin: 4.4 g/dL (ref 3.5–5.2)
BUN: 14 mg/dL (ref 6–23)
Calcium: 9.6 mg/dL (ref 8.4–10.5)
Chloride: 99 mEq/L (ref 96–112)
Glucose, Bld: 92 mg/dL (ref 70–99)
Potassium: 3.8 mEq/L (ref 3.5–5.3)
Sodium: 140 mEq/L (ref 135–145)
Total Protein: 6.9 g/dL (ref 6.0–8.3)

## 2011-07-21 LAB — CBC
HCT: 43.7 % (ref 39.0–52.0)
Hemoglobin: 14.6 g/dL (ref 13.0–17.0)
MCV: 90.5 fL (ref 78.0–100.0)
RDW: 13.3 % (ref 11.5–15.5)
WBC: 6.5 10*3/uL (ref 4.0–10.5)

## 2011-07-21 LAB — LIPID PANEL
HDL: 27 mg/dL — ABNORMAL LOW (ref >39)
Triglycerides: 737 mg/dL — ABNORMAL HIGH (ref <150)

## 2011-07-24 LAB — HIV-1 RNA QUANT-NO REFLEX-BLD
HIV 1 RNA Quant: <20 copies/mL (ref <20)
HIV-1 RNA Quant, Log: <1.3 {Log} (ref <1.30)

## 2011-08-02 ENCOUNTER — Telehealth: Payer: Self-pay | Admitting: Infectious Diseases

## 2011-08-02 NOTE — Telephone Encounter (Signed)
Received information from Support Group it is time to renew patient's assistance.  Called Support to inform them that he does not need the assistance.  The patient has insurance.  He came by and picked up a co-pay card last week.

## 2011-08-07 ENCOUNTER — Encounter: Payer: Self-pay | Admitting: Infectious Diseases

## 2011-08-07 ENCOUNTER — Ambulatory Visit (INDEPENDENT_AMBULATORY_CARE_PROVIDER_SITE_OTHER): Payer: 59 | Admitting: Infectious Diseases

## 2011-08-07 DIAGNOSIS — E785 Hyperlipidemia, unspecified: Secondary | ICD-10-CM

## 2011-08-07 DIAGNOSIS — F17200 Nicotine dependence, unspecified, uncomplicated: Secondary | ICD-10-CM

## 2011-08-07 DIAGNOSIS — B20 Human immunodeficiency virus [HIV] disease: Secondary | ICD-10-CM

## 2011-08-07 DIAGNOSIS — F341 Dysthymic disorder: Secondary | ICD-10-CM

## 2011-08-07 DIAGNOSIS — I10 Essential (primary) hypertension: Secondary | ICD-10-CM

## 2011-08-07 HISTORY — DX: Hyperlipidemia, unspecified: E78.5

## 2011-08-07 MED ORDER — GEMFIBROZIL 600 MG PO TABS: 600.0000 mg | ORAL_TABLET | Freq: Two times a day (BID) | ORAL | Status: DC

## 2011-08-07 NOTE — Assessment & Plan Note (Signed)
Will start him on lopid. This will not interact with ART. May interact with his HCTZ in his BP rx. Will have him back in 2 months to recheck his LFTs.

## 2011-08-07 NOTE — Assessment & Plan Note (Signed)
He is doing very well. Will cont his current art for now (could consider change to stribild or wait for other qd integrase inhibitor). Given condoms, has gotten flu shot. Will see him back in 2 months.

## 2011-08-07 NOTE — Progress Notes (Signed)
  Subjective:    Patient ID: Jon Shannon, male    DOB: 16-Feb-1966, 46 y.o.   MRN: [id number]  HPI 46 yo M with HIV+ 09-2009,  (G190A mutation). He was started on ISN/TRV. Last CD4 940, VL <20, Trig 737 (07-04-11).  Feels like he is having exhaustion, tremors, difficulty with word finding. Exhausted by 10am each day. Has been reducing the dose of his doxepin to see if this helps.  Has been away from his usual diet and exercise regimen.     Review of Systems  Constitutional: Negative for appetite change and unexpected weight change.  Respiratory: Positive for shortness of breath.        Single episode of SOB this AM. Attributes to anxiety.   Gastrointestinal: Negative for diarrhea and constipation.       Occas anal leakage. BM less firm than previous.   Genitourinary: Negative for dysuria.  Psychiatric/Behavioral: Positive for decreased concentration.       Objective:   Physical Exam  Constitutional: He appears well-developed and well-nourished.  HENT:  Ears:  Mouth/Throat: No oropharyngeal exudate.  Eyes: EOM are normal. Pupils are equal, round, and reactive to light.  Neck: Neck supple.  Cardiovascular: Normal rate, regular rhythm and normal heart sounds.   Pulmonary/Chest: Effort normal and breath sounds normal.  Abdominal: Soft. Bowel sounds are normal. There is no tenderness.  Lymphadenopathy:    He has no cervical adenopathy.          Assessment & Plan:

## 2011-08-07 NOTE — Assessment & Plan Note (Signed)
Will cont to f/u with his PCP, greatly appreciate their partnering with [place].

## 2011-08-07 NOTE — Assessment & Plan Note (Signed)
Doing ok. His concerns re: memory and fatigue are likely related to this. Will f/u with his PCP.

## 2011-08-07 NOTE — Assessment & Plan Note (Signed)
Counseled to quit 

## 2011-08-28 ENCOUNTER — Telehealth: Payer: Self-pay | Admitting: Infectious Diseases

## 2011-08-28 NOTE — Telephone Encounter (Signed)
Received call from [name] about getting prescriptions for his Lexapro and his blood pressure medications.  He said Dr. Ninetta Lights told him he could write them for him.  I told [name] that Dr. Ninetta Lights is out of the office until next week.  [name] said he did not have the sick leave time to go to the doctor and if he used his vacation time it has to be scheduled.  He said he would check and see what he could do.  I told him if he needed any more assistance to call me back.

## 2011-08-29 ENCOUNTER — Other Ambulatory Visit: Payer: Self-pay | Admitting: Infectious Diseases

## 2011-08-29 DIAGNOSIS — E785 Hyperlipidemia, unspecified: Secondary | ICD-10-CM

## 2011-09-04 ENCOUNTER — Other Ambulatory Visit: Payer: Self-pay | Admitting: *Deleted

## 2011-09-04 ENCOUNTER — Other Ambulatory Visit: Payer: Self-pay | Admitting: Infectious Diseases

## 2011-09-04 DIAGNOSIS — B20 Human immunodeficiency virus [HIV] disease: Secondary | ICD-10-CM

## 2011-09-04 MED ORDER — EMTRICITABINE-TENOFOVIR DF 200-300 MG PO TABS: 1.0000 | ORAL_TABLET | Freq: Every day | ORAL | Status: DC

## 2011-09-04 MED ORDER — RALTEGRAVIR POTASSIUM 400 MG PO TABS: 400.0000 mg | ORAL_TABLET | Freq: Two times a day (BID) | ORAL | Status: DC

## 2011-09-14 ENCOUNTER — Telehealth: Payer: Self-pay | Admitting: *Deleted

## 2011-09-14 NOTE — Telephone Encounter (Signed)
He is coming in for labs on the 20th. Wants his testosterone checked. C/o constant fatigue. I told him I will ask the md about adding this to his labs

## 2011-09-20 ENCOUNTER — Other Ambulatory Visit: Payer: Self-pay | Admitting: Infectious Diseases

## 2011-09-20 ENCOUNTER — Other Ambulatory Visit: Payer: 59

## 2011-09-20 DIAGNOSIS — E785 Hyperlipidemia, unspecified: Secondary | ICD-10-CM

## 2011-09-21 LAB — LIPID PANEL
Cholesterol: 165 mg/dL (ref 0–200)
HDL: 26 mg/dL — ABNORMAL LOW (ref >39)
Triglycerides: 385 mg/dL — ABNORMAL HIGH (ref <150)
VLDL: 77 mg/dL — ABNORMAL HIGH (ref 0–40)

## 2011-09-21 LAB — COMPREHENSIVE METABOLIC PANEL
Albumin: 4.6 g/dL (ref 3.5–5.2)
Alkaline Phosphatase: 63 U/L (ref 39–117)
BUN: 16 mg/dL (ref 6–23)
CO2: 28 mEq/L (ref 19–32)
Glucose, Bld: 92 mg/dL (ref 70–99)
Sodium: 140 mEq/L (ref 135–145)
Total Bilirubin: 0.5 mg/dL (ref 0.3–1.2)
Total Protein: 7.1 g/dL (ref 6.0–8.3)

## 2011-10-09 ENCOUNTER — Ambulatory Visit: Payer: 59 | Admitting: Infectious Diseases

## 2011-10-09 ENCOUNTER — Ambulatory Visit (INDEPENDENT_AMBULATORY_CARE_PROVIDER_SITE_OTHER): Payer: 59 | Admitting: Infectious Diseases

## 2011-10-09 ENCOUNTER — Encounter: Payer: Self-pay | Admitting: Infectious Diseases

## 2011-10-09 DIAGNOSIS — E785 Hyperlipidemia, unspecified: Secondary | ICD-10-CM

## 2011-10-09 DIAGNOSIS — Z113 Encounter for screening for infections with a predominantly sexual mode of transmission: Secondary | ICD-10-CM

## 2011-10-09 DIAGNOSIS — R599 Enlarged lymph nodes, unspecified: Secondary | ICD-10-CM

## 2011-10-09 DIAGNOSIS — R591 Generalized enlarged lymph nodes: Secondary | ICD-10-CM

## 2011-10-09 DIAGNOSIS — B20 Human immunodeficiency virus [HIV] disease: Secondary | ICD-10-CM

## 2011-10-09 NOTE — Assessment & Plan Note (Signed)
Will send him to ENT for Bx.

## 2011-10-09 NOTE — Progress Notes (Signed)
  Subjective:    Patient ID: Jon Shannon, male    DOB: April 22, 1966, 46 y.o.   MRN: [id number]  HPI 46 yo M with HIV+ 09-2009, (G190A mutation). He was started on ISN/TRV. He also has a hx of hyperlipidemia. At previous visit was started on lopid. His Trig has come down from >700 to 300s now. His LFTs have been stable.  Today states that his chest hurts from coughing, sinus infection that kept him out of work for 3 days. Took Augmentin. Headache as well, feels tension in his posterior neck. Since on augmentin.  Lab Results  Component Value Date   CHOL 165 09/20/2011   HDL 26* 09/20/2011   LDLCALC 62 09/20/2011   TRIG 385* 09/20/2011   CHOLHDL 6.3 09/20/2011    Also, he has noted a swollen LN on his L mandible for the last 6 weeks. No dysphagia, SOB or difficult ywith speech.     Review of Systems     Objective:   Physical Exam  Constitutional: He appears well-developed and well-nourished.  HENT:  Head:    Mouth/Throat: No oropharyngeal exudate.  Eyes: EOM are normal. Pupils are equal, round, and reactive to light.  Neck: Neck supple.  Cardiovascular: Normal rate, regular rhythm and normal heart sounds.   Pulmonary/Chest: Effort normal and breath sounds normal.  Abdominal: Soft. Bowel sounds are normal. He exhibits no distension. There is no tenderness.  Lymphadenopathy:    He has no axillary adenopathy.          Assessment & Plan:

## 2011-10-09 NOTE — Assessment & Plan Note (Signed)
His lipids are better. Will continue to watch and will follow his lipids as well.

## 2011-10-09 NOTE — Assessment & Plan Note (Addendum)
Doing well, will repeat his labs at his f/u visit. Offered/refused condoms. rtc 2 months.

## 2011-10-10 ENCOUNTER — Telehealth: Payer: Self-pay | Admitting: *Deleted

## 2011-10-10 NOTE — Telephone Encounter (Signed)
Called and notified patient he has appointment with [facility], Dr. Jenne Pane on 10/17/11 at 3:20 PM.  Office note faxed. Wendall Mola CMA

## 2011-10-20 ENCOUNTER — Other Ambulatory Visit (HOSPITAL_COMMUNITY)
Admission: RE | Admit: 2011-10-20 | Discharge: 2011-10-20 | Disposition: A | Discharge status: Home / Self Care | Payer: 59 | Source: Ambulatory Visit | Attending: Otolaryngology | Admitting: Otolaryngology

## 2011-10-20 ENCOUNTER — Other Ambulatory Visit: Payer: Self-pay | Admitting: Otolaryngology

## 2011-10-20 DIAGNOSIS — R220 Localized swelling, mass and lump, head: Secondary | ICD-10-CM | POA: Insufficient documentation

## 2011-10-30 HISTORY — PX: OTHER SURGICAL HISTORY: SHX169

## 2011-11-06 ENCOUNTER — Other Ambulatory Visit: Payer: Self-pay | Admitting: Otolaryngology

## 2011-11-07 ENCOUNTER — Other Ambulatory Visit: Payer: Self-pay | Admitting: Otolaryngology

## 2011-11-09 DIAGNOSIS — C8190 Hodgkin lymphoma, unspecified, unspecified site: Secondary | ICD-10-CM

## 2011-11-09 DIAGNOSIS — Z8571 Personal history of Hodgkin lymphoma: Secondary | ICD-10-CM | POA: Insufficient documentation

## 2011-11-09 HISTORY — DX: Hodgkin lymphoma, unspecified, unspecified site: C81.90

## 2011-11-10 ENCOUNTER — Telehealth: Payer: Self-pay | Admitting: Oncology

## 2011-11-10 ENCOUNTER — Other Ambulatory Visit: Payer: Self-pay | Admitting: Infectious Diseases

## 2011-11-10 ENCOUNTER — Telehealth: Payer: Self-pay | Admitting: *Deleted

## 2011-11-10 DIAGNOSIS — C8190 Hodgkin lymphoma, unspecified, unspecified site: Secondary | ICD-10-CM

## 2011-11-10 NOTE — Telephone Encounter (Signed)
Rec'd 2 urgent request referrals on patient today, from Drs Ninetta Lights and Jenne Pane for newly diagnosed lymphoma.  We have made appts for early next week for patient to see Dr Gaylyn Rong, but in speaking to patient about urgent referral, he revealed to this nurse that his mother is unaware of his HIV status and wants the staff here at [facility] to be aware of his choice of privacy. I informed patient that although we do not openly discuss this information, if his mother is present in the room with him, it is understood that his health issues will be addressed openly to provide him with appropriate treatment and plans. He understands this and is trying to find a way to talk to his parents about his diagnosis but does not want to feel like he is "letting them down yet again". Offered patient to speak with our counselors and/or social workers on how to best discuss his family matters. He states he has support from his sister and she will be with him at new patient consult.

## 2011-11-10 NOTE — Telephone Encounter (Signed)
Amy called pt and confirmed appt for 04/16.  will fax over a letter to Dr. Jenne Pane with appt d/t

## 2011-11-10 NOTE — Patient Instructions (Addendum)
1.  Your diagnosis:  Hodgkin's lymphoma. 2.  Staging:  To be determined. 3.  Prognosis:  To be determined.   4.  Treatment: - chemotherapy ABVD (Adriamycin, Bleomycin, Vinblastine, Dacarbazine) IV once every 2 weeks.  Each cycle of chemo is 4 wks long; chemo is given on day 1 and then day 15.  - Another potential chemoregimen is BEACOPP which is much more toxic.  I normally do not recommend this regimen upfront.  - The day after chemo of each cycle (Day 2 and 16), you will receive Neulasta injection to increase your WBC to decrease the risk of infection. - Chance of cure anywhere between 50-90% depending of prognostic features.   - Potential side effects of ABVD chemo regimen include but not limited to:  Fatigue, hair loss, mouth sore, nausea/vomiting, congestive heart failure, numbness and tingling of fingers/toes; low blood count, infection, bleeding, infertility, pneumonitis, secondary cancer (low risk of this).   5.  What to do to get ready for chemo:  - Bone marrow biopsy to rule out involvement of bone marrow space. - PET scan as baseline to assess response to therapy later.   - Diagnostic CT scan to measure the size of the lesions for follow up later.  - Portacath for chemo access.  - Echo of the heart to assess baseline heart function. - Lung function test to ensure good lung function before chemo.  - chemo class.   6.  How to know whether chemo is working - Your lymph node swelling should decrease. - After 2 months of chemo, we will repeat PET scan and there should be objective response on this PET scan.  If not, we can switch to BEACOPP.

## 2011-11-11 ENCOUNTER — Encounter: Payer: Self-pay | Admitting: Oncology

## 2011-11-13 ENCOUNTER — Telehealth: Payer: Self-pay | Admitting: Oncology

## 2011-11-13 NOTE — Telephone Encounter (Signed)
Referred by Dr. Jenne Pane Dx- Lymphoma/HIV

## 2011-11-14 ENCOUNTER — Encounter: Payer: Self-pay | Admitting: Oncology

## 2011-11-14 ENCOUNTER — Other Ambulatory Visit (HOSPITAL_BASED_OUTPATIENT_CLINIC_OR_DEPARTMENT_OTHER): Payer: 59 | Admitting: Lab

## 2011-11-14 ENCOUNTER — Ambulatory Visit: Payer: 59

## 2011-11-14 ENCOUNTER — Telehealth: Payer: Self-pay | Admitting: Oncology

## 2011-11-14 ENCOUNTER — Other Ambulatory Visit: Payer: Self-pay | Admitting: Physician Assistant

## 2011-11-14 ENCOUNTER — Other Ambulatory Visit: Payer: Self-pay

## 2011-11-14 ENCOUNTER — Ambulatory Visit (HOSPITAL_BASED_OUTPATIENT_CLINIC_OR_DEPARTMENT_OTHER): Payer: 59 | Admitting: Oncology

## 2011-11-14 VITALS — BP 141/82 | HR 64 | Temp 97.6°F | Ht 72.0 in | Wt 310.2 lb

## 2011-11-14 DIAGNOSIS — B20 Human immunodeficiency virus [HIV] disease: Secondary | ICD-10-CM

## 2011-11-14 DIAGNOSIS — C8190 Hodgkin lymphoma, unspecified, unspecified site: Secondary | ICD-10-CM

## 2011-11-14 DIAGNOSIS — E781 Pure hyperglyceridemia: Secondary | ICD-10-CM

## 2011-11-14 DIAGNOSIS — F4310 Post-traumatic stress disorder, unspecified: Secondary | ICD-10-CM

## 2011-11-14 LAB — CBC WITH DIFFERENTIAL/PLATELET
Eosinophils Absolute: 0.1 10*3/uL (ref 0.0–0.5)
HCT: 42.5 % (ref 38.4–49.9)
HGB: 14.6 g/dL (ref 13.0–17.1)
LYMPH%: 48.4 % (ref 14.0–49.0)
MONO#: 0.4 10*3/uL (ref 0.1–0.9)
NEUT#: 2.6 10*3/uL (ref 1.5–6.5)
NEUT%: 43.3 % (ref 39.0–75.0)
Platelets: 238 10*3/uL (ref 140–400)
RBC: 4.68 10*6/uL (ref 4.20–5.82)
WBC: 6 10*3/uL (ref 4.0–10.3)
nRBC: 0 % (ref 0–0)

## 2011-11-14 MED ORDER — PROCHLORPERAZINE MALEATE 10 MG PO TABS: 10.0000 mg | ORAL_TABLET | Freq: Four times a day (QID) | ORAL | Status: DC | PRN

## 2011-11-14 MED ORDER — ONDANSETRON HCL 8 MG PO TABS: ORAL_TABLET | ORAL | Status: DC

## 2011-11-14 MED ORDER — LORAZEPAM 0.5 MG PO TABS: 0.5000 mg | ORAL_TABLET | Freq: Four times a day (QID) | ORAL | Status: AC | PRN
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
## 2011-11-14 NOTE — Telephone Encounter (Signed)
All appts made and printed x 2,contrast and instruc. Given.  Pt aware that he will rec. A call regarding instruc for port and for bmbx     aom

## 2011-11-14 NOTE — Telephone Encounter (Signed)
pt called and r/s chemo class on 04/22 to 04/24 due to biopsy appt

## 2011-11-14 NOTE — Progress Notes (Signed)
Patient came in today as a new patient with his sister,i did explain to them about our financial assistance program we offer her and also the co-pay assistance,i gave him an application to fill out and return to us,he did said he would probably need help with his bills,he has one insurance [employer].

## 2011-11-14 NOTE — Progress Notes (Signed)
Patient scheduled for bone marrow biopsy 11/20/11 @ 0900.

## 2011-11-15 ENCOUNTER — Encounter (HOSPITAL_COMMUNITY): Payer: Self-pay | Admitting: Pharmacy Technician

## 2011-11-15 ENCOUNTER — Telehealth (HOSPITAL_COMMUNITY): Payer: Self-pay | Admitting: Oncology

## 2011-11-15 LAB — COMPREHENSIVE METABOLIC PANEL
ALT: 40 U/L (ref 0–53)
CO2: 30 mEq/L (ref 19–32)
Calcium: 9.7 mg/dL (ref 8.4–10.5)
Chloride: 101 mEq/L (ref 96–112)
Creatinine, Ser: 1.05 mg/dL (ref 0.50–1.35)

## 2011-11-15 LAB — SEDIMENTATION RATE: Sed Rate: 5 mm/hr (ref 0–16)

## 2011-11-15 LAB — LACTATE DEHYDROGENASE: LDH: 154 U/L (ref 94–250)

## 2011-11-16 ENCOUNTER — Ambulatory Visit (HOSPITAL_COMMUNITY)
Admission: RE | Admit: 2011-11-16 | Discharge: 2011-11-16 | Disposition: A | Discharge status: Home / Self Care | Payer: 59 | Source: Ambulatory Visit | Attending: Oncology | Admitting: Oncology

## 2011-11-16 ENCOUNTER — Other Ambulatory Visit: Payer: Self-pay | Admitting: Radiology

## 2011-11-16 ENCOUNTER — Encounter (HOSPITAL_COMMUNITY): Payer: Self-pay

## 2011-11-16 ENCOUNTER — Other Ambulatory Visit: Payer: Self-pay | Admitting: Oncology

## 2011-11-16 DIAGNOSIS — Z79899 Other long term (current) drug therapy: Secondary | ICD-10-CM | POA: Insufficient documentation

## 2011-11-16 DIAGNOSIS — B20 Human immunodeficiency virus [HIV] disease: Secondary | ICD-10-CM

## 2011-11-16 DIAGNOSIS — E785 Hyperlipidemia, unspecified: Secondary | ICD-10-CM | POA: Insufficient documentation

## 2011-11-16 DIAGNOSIS — I10 Essential (primary) hypertension: Secondary | ICD-10-CM | POA: Insufficient documentation

## 2011-11-16 DIAGNOSIS — C8190 Hodgkin lymphoma, unspecified, unspecified site: Secondary | ICD-10-CM

## 2011-11-16 DIAGNOSIS — Z21 Asymptomatic human immunodeficiency virus [HIV] infection status: Secondary | ICD-10-CM | POA: Insufficient documentation

## 2011-11-16 LAB — PROTIME-INR: INR: 0.94 (ref 0.00–1.49)

## 2011-11-16 MED ORDER — FENTANYL CITRATE 0.05 MG/ML IJ SOLN
INTRAMUSCULAR | Status: AC
  Filled 2011-11-16: qty 6

## 2011-11-16 MED ORDER — SODIUM CHLORIDE 0.9 % IV SOLN
INTRAVENOUS | Status: DC
  Administered 2011-11-16: 08:00:00 via INTRAVENOUS

## 2011-11-16 MED ORDER — CEFAZOLIN SODIUM-DEXTROSE 2-3 GM-% IV SOLR
INTRAVENOUS | Status: AC
  Administered 2011-11-16: 2000 mg
  Filled 2011-11-16: qty 50

## 2011-11-16 MED ORDER — MIDAZOLAM HCL 5 MG/5ML IJ SOLN
INTRAMUSCULAR | Status: AC | PRN
  Administered 2011-11-16 (×2): 1 mg via INTRAVENOUS

## 2011-11-16 MED ORDER — FENTANYL CITRATE 0.05 MG/ML IJ SOLN
INTRAMUSCULAR | Status: AC | PRN
  Administered 2011-11-16: 100 ug via INTRAVENOUS

## 2011-11-16 MED ORDER — CEFAZOLIN SODIUM 1-5 GM-% IV SOLN: 1.0000 g | Freq: Once | INTRAVENOUS | Status: DC

## 2011-11-16 MED ORDER — MIDAZOLAM HCL 2 MG/2ML IJ SOLN
INTRAMUSCULAR | Status: AC
  Filled 2011-11-16: qty 4

## 2011-11-16 MED ORDER — MIDAZOLAM HCL 2 MG/2ML IJ SOLN
INTRAMUSCULAR | Status: AC
  Filled 2011-11-16: qty 2

## 2011-11-16 MED ORDER — LIDOCAINE HCL 1 % IJ SOLN
INTRAMUSCULAR | Status: AC
  Filled 2011-11-16: qty 20

## 2011-11-16 MED ORDER — HEPARIN SOD (PORK) LOCK FLUSH 100 UNIT/ML IV SOLN
INTRAVENOUS | Status: AC | PRN
  Administered 2011-11-16: 500 [IU]

## 2011-11-16 NOTE — H&P (Signed)
Jon Shannon is an 46 y.o. male.   Chief Complaint: Hodgkins Lymphoma; HIV; HTN HPI: scheduled now for [facility] a Cath placement  Past Medical History  Diagnosis Date  . Hyperlipidemia 08/07/2011  . Hodgkin's lymphoma 11/09/2011  . HIV INFECTION 11/30/2009    no history of opportunistic infection  . HYPERTENSION 11/30/2009  . Hypertriglyceridemia   . PTSD (post-traumatic stress disorder)   . DJD (degenerative joint disease)   . Hepatitis A     resolved.     Past Surgical History  Procedure Date  . Tonsillectomy   . Left cervical node excisional biopsy 10/2011  . Left index finger trauma repair     Family History  Problem Relation Age of Onset  . Cancer Mother 109    breast cancer   . Seizures Mother   . Hypertension Mother   . Hyperlipidemia Mother   . Diabetes Father   . Hypertension Father   . Hyperlipidemia Father   . Cancer Father     brain tumor (unknown type)  . Cancer Maternal Aunt     lung cancer  . Cancer Maternal Grandmother     CLL   Social History:  reports that he quit smoking about 4 months ago. His smoking use included Cigarettes. He has never used smokeless tobacco. He reports that he does not drink alcohol or use illicit drugs.  Allergies:  Allergies  Allergen Reactions  . Shrimp (Shellfish Allergy) Anaphylaxis    Medications Prior to Admission  Medication Sig Dispense Refill  . bisoprolol-hydrochlorothiazide (ZIAC) 5-6.25 MG per tablet Take 1 tablet by mouth every morning.       . Cholecalciferol (VITAMIN D3) 1000 UNITS tablet Take 1,000 Units by mouth daily.        . diphenhydrAMINE (BENADRYL) 25 MG tablet Take 25 mg by mouth at bedtime as needed. For sleep      . emtricitabine-tenofovir (TRUVADA) 200-300 MG per tablet Take 1 tablet by mouth daily.      Marland Kitchen escitalopram (LEXAPRO) 10 MG tablet Take 10 mg by mouth daily.        Marland Kitchen GARLIC OIL PO Take 1 tablet by mouth daily.      Marland Kitchen gemfibrozil (LOPID) 600 MG tablet Take 600 mg by mouth 2 (two) times daily.       Marland Kitchen HYDROcodone-acetaminophen (NORCO) 5-325 MG per tablet Take 1 tablet by mouth every 6 (six) hours as needed. For pain      . loratadine (CLARITIN) 10 MG tablet Take 10 mg by mouth daily.        Marland Kitchen LORazepam (ATIVAN) 0.5 MG tablet Take 1 tablet (0.5 mg total) by mouth every 6 (six) hours as needed (Anticipation Nausea or vomiting).  30 tablet  0  . ondansetron (ZOFRAN) 8 MG tablet Take 1 tab two times a day starting the day after chemo for 3 days. Then take 1 tab two times a day as needed for nausea or vomiting.  30 tablet  1  . prochlorperazine (COMPAZINE) 10 MG tablet Take 1 tablet (10 mg total) by mouth every 6 (six) hours as needed (Nausea or vomiting).  30 tablet  1  . raltegravir (ISENTRESS) 400 MG tablet Take 400 mg by mouth 2 (two) times daily.      . vitamin C (ASCORBIC ACID) 500 MG tablet Take 1,000 mg by mouth daily.       Medications Prior to Admission  Medication Dose Route Frequency Provider Last Rate Last Dose  . 0.9 %  sodium  chloride infusion   Intravenous Continuous Abundio Miu, MD      . ceFAZolin (ANCEF) 2-3 GM-% IVPB SOLR           . ceFAZolin (ANCEF) IVPB 1 g/50 mL premix  1 g Intravenous Once Abundio Miu, MD      . fentaNYL (SUBLIMAZE) 0.05 MG/ML injection           . lidocaine (XYLOCAINE) 1 % (with pres) injection           . midazolam (VERSED) 2 MG/2ML injection           . midazolam (VERSED) 2 MG/2ML injection             Results for orders placed in visit on 11/14/11 (from the past 48 hour(s))  CBC WITH DIFFERENTIAL     Status: Normal   Collection Time   11/14/11  8:52 AM      Component Value Range Comment   WBC 6.0  4.0 - 10.3 (10e3/uL)    NEUT# 2.6  1.5 - 6.5 (10e3/uL)    HGB 14.6  13.0 - 17.1 (g/dL)    HCT 11.9  14.7 - 82.9 (%)    Platelets 238  140 - 400 (10e3/uL)    MCV 90.8  79.3 - 98.0 (fL)    MCH 31.3  27.2 - 33.4 (pg)    MCHC 34.5  32.0 - 36.0 (g/dL)    RBC 5.62  1.30 - 8.65 (10e6/uL)    RDW 12.8  11.0 - 14.6 (%)    lymph# 2.9  0.9 - 3.3  (10e3/uL)    MONO# 0.4  0.1 - 0.9 (10e3/uL)    Eosinophils Absolute 0.1  0.0 - 0.5 (10e3/uL)    Basophils Absolute 0.0  0.0 - 0.1 (10e3/uL)    NEUT% 43.3  39.0 - 75.0 (%)    LYMPH% 48.4  14.0 - 49.0 (%)    MONO% 6.5  0.0 - 14.0 (%)    EOS% 1.4  0.0 - 7.0 (%)    BASO% 0.4  0.0 - 2.0 (%)    nRBC 0  0 - 0 (%)   COMPREHENSIVE METABOLIC PANEL     Status: Abnormal   Collection Time   11/14/11  8:52 AM      Component Value Range Comment   Sodium 139  135 - 145 (mEq/L)    Potassium 4.0  3.5 - 5.3 (mEq/L)    Chloride 101  96 - 112 (mEq/L)    CO2 30  19 - 32 (mEq/L)    Glucose, Bld 158 (*) 70 - 99 (mg/dL)    BUN 11  6 - 23 (mg/dL)    Creatinine, Ser 7.84  0.50 - 1.35 (mg/dL)    Total Bilirubin 0.4  0.3 - 1.2 (mg/dL)    Alkaline Phosphatase 69  39 - 117 (U/L)    AST 38 (*) 0 - 37 (U/L)    ALT 40  0 - 53 (U/L)    Total Protein 7.0  6.0 - 8.3 (g/dL)    Albumin 4.3  3.5 - 5.2 (g/dL)    Calcium 9.7  8.4 - 10.5 (mg/dL)   LACTATE DEHYDROGENASE     Status: Normal   Collection Time   11/14/11  8:52 AM      Component Value Range Comment   LDH 154  94 - 250 (U/L)   URIC ACID     Status: Normal   Collection Time   11/14/11  8:52 AM  Component Value Range Comment   Uric Acid, Serum 7.8  4.0 - 7.8 (mg/dL)   BETA 2 MICROGLOBULINE, SERUM     Status: Abnormal   Collection Time   11/14/11  8:52 AM      Component Value Range Comment   Beta-2 Microglobulin 2.43 (*) 1.01 - 1.73 (mg/L)   SEDIMENTATION RATE     Status: Normal   Collection Time   11/14/11  8:52 AM      Component Value Range Comment   Sed Rate 5  0 - 16 (mm/hr)    No results found.  Review of Systems  Constitutional: Negative for fever.  Cardiovascular: Negative for chest pain.  Gastrointestinal: Negative for nausea and vomiting.  Neurological: Negative for headaches.  Psychiatric/Behavioral: The patient is nervous/anxious.     Blood pressure 145/86, pulse 70, temperature 98.7 F (37.1 C), temperature source Oral, resp. rate  70, height 6' (1.829 m), weight 310 lb (140.615 kg), SpO2 95.00%. Physical Exam  Constitutional: He is oriented to person, place, and time. He appears well-developed and well-nourished.  Cardiovascular: Normal rate, regular rhythm and normal heart sounds.   No murmur heard. Respiratory: Effort normal and breath sounds normal. He has no wheezes.  GI: Soft. Bowel sounds are normal. There is no tenderness.  Musculoskeletal: Normal range of motion.  Neurological: He is alert and oriented to person, place, and time. Coordination normal.  Skin: Skin is warm.  Psychiatric: He has a normal mood and affect. His behavior is normal. Judgment and thought content normal.     Assessment/Plan Hodgkins Lymphoma Scheduled for [facility] placement Pt aware of procedure benefits and risks and agreeable to proceed. Consent signed.  Keiston Manley A 11/16/2011, 8:07 AM

## 2011-11-16 NOTE — Procedures (Signed)
Placement of right IJ port.  Tip in lower SVC.  Ready to use.

## 2011-11-16 NOTE — Discharge Instructions (Signed)
Implanted Port Instructions  An implanted port is a central line that has a round shape and is placed under the skin. It is used for long-term IV (intravenous) access for:   Medicine.   Fluids.   Liquid nutrition, such as TPN (total parenteral nutrition).   Blood samples.  Ports can be placed:   In the chest area just below the collarbone (this is the most common place.)   In the arms.   In the belly (abdomen) area.   In the legs.  PARTS OF THE PORT  A port has 2 main parts:   The reservoir. The reservoir is round, disc-shaped, and will be a small, raised area under your skin.   The reservoir is the part where a needle is inserted (accessed) to either give medicines or to draw blood.   The catheter. The catheter is a long, slender tube that extends from the reservoir. The catheter is placed into a large vein.   Medicine that is inserted into the reservoir goes into the catheter and then into the vein.  INSERTION OF THE PORT   The port is surgically placed in either an operating room or in a procedural area (interventional radiology).   Medicine may be given to help you relax during the procedure.   The skin where the port will be inserted is numbed (local anesthetic).   1 or 2 small cuts (incisions) will be made in the skin to insert the port.   The port can be used after it has been inserted.  INCISION SITE CARE   The incision site may have small adhesive strips on it. This helps keep the incision site closed. Sometimes, no adhesive strips are placed. Instead of adhesive strips, a special kind of surgical glue is used to keep the incision closed.   If adhesive strips were placed on the incision sites, do not take them off. They will fall off on their own.   The incision site may be sore for 1 to 2 days. Pain medicine can help.   Do not get the incision site wet. Bathe or shower as directed by your caregiver.   The incision site should heal in 5 to 7 days. A small scar may form after the  incision has healed.  ACCESSING THE PORT  Special steps must be taken to access the port:   Before the port is accessed, a numbing cream can be placed on the skin. This helps numb the skin over the port site.   A sterile technique is used to access the port.   The port is accessed with a needle. Only "non-coring" port needles should be used to access the port. Once the port is accessed, a blood return should be checked. This helps ensure the port is in the vein and is not clogged (clotted).   If your caregiver believes your port should remain accessed, a clear (transparent) bandage will be placed over the needle site. The bandage and needle will need to be changed every week or as directed by your caregiver.   Keep the bandage covering the needle clean and dry. Do not get it wet. Follow your caregiver's instructions on how to take a shower or bath when the port is accessed.   If your port does not need to stay accessed, no bandage is needed over the port.  FLUSHING THE PORT  Flushing the port keeps it from getting clogged. How often the port is flushed depends on:   If a   constant infusion is running. If a constant infusion is running, the port may not need to be flushed.   If intermittent medicines are given.   If the port is not being used.  For intermittent medicines:   The port will need to be flushed:   After medicines have been given.   After blood has been drawn.   As part of routine maintenance.   A port is normally flushed with:   Normal saline.   Heparin.   Follow your caregiver's advice on how often, how much, and the type of flush to use on your port.  IMPORTANT PORT INFORMATION   Tell your caregiver if you are allergic to heparin.   After your port is placed, you will get a manufacturer's information card. The card has information about your port. Keep this card with you at all times.   There are many types of ports available. Know what kind of port you have.   In case of an  emergency, it may be helpful to wear a medical alert bracelet. This can help alert health care workers that you have a port.   The port can stay in for as long as your caregiver believes it is necessary.   When it is time for the port to come out, surgery will be done to remove it. The surgery will be similar to how the port was put in.   If you are in the hospital or clinic:   Your port will be taken care of and flushed by a nurse.   If you are at home:   A home health care nurse may give medicines and take care of the port.   You or a family member can get special training and directions for giving medicine and taking care of the port at home.  SEEK IMMEDIATE MEDICAL CARE IF:    Your port does not flush or you are unable to get a blood return.   New drainage or pus is coming from the incision.   A bad smell is coming from the incision site.   You develop swelling or increased redness at the incision site.   You develop increased swelling or pain at the port site.   You develop swelling or pain in the surrounding skin near the port.   You have an oral temperature above 102 F (38.9 C), not controlled by medicine.  MAKE SURE YOU:    Understand these instructions.   Will watch your condition.   Will get help right away if you are not doing well or get worse.  Document Released: 07/17/2005 Document Revised: 07/06/2011 Document Reviewed: 10/08/2008  [employer] Patient Information 2012 [employer]          Moderate Sedation, Adult  Moderate sedation is given to help you relax or even sleep through a procedure. You may remain sleepy, be clumsy, or have poor balance for several hours following this procedure. Arrange for a responsible adult, family member, or friend to take you home. A responsible adult should stay with you for at least 24 hours or until the medicines have worn off.   Do not participate in any activities where you could become injured for the next 24 hours, or until you feel normal  again. Do not:   Drive.   Swim.   Ride a bicycle.   Operate heavy machinery.   Cook.   Use power tools.   Climb ladders.   Work at heights.   Do not   make important decisions or sign legal documents until you are improved.   Vomiting may occur if you eat too soon. When you can drink without vomiting, try water, juice, or soup. Try solid foods if you feel little or no nausea.   Only take over-the-counter or prescription medications for pain, discomfort, or fever as directed by your caregiver.If pain medications have been prescribed for you, ask your caregiver how soon it is safe to take them.   Make sure you and your family fully understands everything about the medication given to you. Make sure you understand what side effects may occur.   You should not drink alcohol, take sleeping pills, or medications that cause drowsiness for at least 24 hours.   If you smoke, do not smoke alone.   If you are feeling better, you may resume normal activities 24 hours after receiving sedation.   Keep all appointments as scheduled. Follow all instructions.   Ask questions if you do not understand.  SEEK MEDICAL CARE IF:    Your skin is pale or bluish in color.   You continue to feel sick to your stomach (nauseous) or throw up (vomit).   Your pain is getting worse and not helped by medication.   You have bleeding or swelling.   You are still sleepy or feeling clumsy after 24 hours.  SEEK IMMEDIATE MEDICAL CARE IF:    You develop a rash.   You have difficulty breathing.   You develop any type of allergic problem.   You have a fever.  Document Released: 04/11/2001 Document Revised: 07/06/2011 Document Reviewed: 09/02/2007  [employer] Patient Information 2012 [employer].

## 2011-11-17 ENCOUNTER — Ambulatory Visit (HOSPITAL_COMMUNITY)
Admission: RE | Admit: 2011-11-17 | Discharge: 2011-11-17 | Disposition: A | Discharge status: Home / Self Care | Payer: 59 | Source: Ambulatory Visit | Attending: Oncology | Admitting: Oncology

## 2011-11-17 ENCOUNTER — Ambulatory Visit (HOSPITAL_COMMUNITY): Payer: 59

## 2011-11-17 DIAGNOSIS — D1809 Hemangioma of other sites: Secondary | ICD-10-CM | POA: Insufficient documentation

## 2011-11-17 DIAGNOSIS — K7689 Other specified diseases of liver: Secondary | ICD-10-CM | POA: Insufficient documentation

## 2011-11-17 DIAGNOSIS — B20 Human immunodeficiency virus [HIV] disease: Secondary | ICD-10-CM

## 2011-11-17 DIAGNOSIS — R911 Solitary pulmonary nodule: Secondary | ICD-10-CM | POA: Insufficient documentation

## 2011-11-17 DIAGNOSIS — R599 Enlarged lymph nodes, unspecified: Secondary | ICD-10-CM | POA: Insufficient documentation

## 2011-11-17 DIAGNOSIS — C8190 Hodgkin lymphoma, unspecified, unspecified site: Secondary | ICD-10-CM | POA: Insufficient documentation

## 2011-11-17 DIAGNOSIS — I059 Rheumatic mitral valve disease, unspecified: Secondary | ICD-10-CM

## 2011-11-17 LAB — PULMONARY FUNCTION TEST

## 2011-11-17 MED ORDER — ALBUTEROL SULFATE (5 MG/ML) 0.5% IN NEBU
2.5000 mg | INHALATION_SOLUTION | Freq: Once | RESPIRATORY_TRACT | Status: AC
  Administered 2011-11-17: 2.5 mg via RESPIRATORY_TRACT

## 2011-11-17 MED ORDER — IOHEXOL 300 MG/ML  SOLN
125.0000 mL | Freq: Once | INTRAMUSCULAR | Status: AC | PRN
  Administered 2011-11-17: 125 mL via INTRAVENOUS

## 2011-11-17 NOTE — Progress Notes (Signed)
*  PRELIMINARY RESULTS* Echocardiogram 2D Echocardiogram has been performed.  Jon Shannon [facility] 11/17/2011, 10:40 AM

## 2011-11-20 ENCOUNTER — Ambulatory Visit (HOSPITAL_COMMUNITY)
Admission: RE | Admit: 2011-11-20 | Discharge: 2011-11-20 | Disposition: A | Discharge status: Home / Self Care | Payer: 59 | Source: Ambulatory Visit | Attending: Oncology | Admitting: Oncology

## 2011-11-20 ENCOUNTER — Encounter (HOSPITAL_COMMUNITY): Payer: Self-pay

## 2011-11-20 ENCOUNTER — Other Ambulatory Visit: Payer: 59

## 2011-11-20 ENCOUNTER — Other Ambulatory Visit: Payer: Self-pay | Admitting: Radiology

## 2011-11-20 DIAGNOSIS — C8190 Hodgkin lymphoma, unspecified, unspecified site: Secondary | ICD-10-CM | POA: Insufficient documentation

## 2011-11-20 DIAGNOSIS — B20 Human immunodeficiency virus [HIV] disease: Secondary | ICD-10-CM

## 2011-11-20 LAB — CBC
HCT: 42.1 % (ref 39.0–52.0)
Hemoglobin: 14.4 g/dL (ref 13.0–17.0)
MCH: 30.6 pg (ref 26.0–34.0)
MCHC: 34.2 g/dL (ref 30.0–36.0)
MCV: 89.6 fL (ref 78.0–100.0)
RDW: 12.2 % (ref 11.5–15.5)

## 2011-11-20 LAB — PROTIME-INR: INR: 0.98 (ref 0.00–1.49)

## 2011-11-20 MED ORDER — SODIUM CHLORIDE 0.9 % IV SOLN
Freq: Once | INTRAVENOUS | Status: AC
  Administered 2011-11-20: 09:00:00 via INTRAVENOUS

## 2011-11-20 MED ORDER — FENTANYL CITRATE 0.05 MG/ML IJ SOLN
INTRAMUSCULAR | Status: AC | PRN
  Administered 2011-11-20: 50 ug via INTRAVENOUS
  Administered 2011-11-20: 100 ug via INTRAVENOUS

## 2011-11-20 MED ORDER — HEPARIN SOD (PORK) LOCK FLUSH 100 UNIT/ML IV SOLN
500.0000 [IU] | INTRAVENOUS | Status: DC | PRN
  Filled 2011-11-20: qty 5

## 2011-11-20 MED ORDER — HEPARIN SOD (PORK) LOCK FLUSH 100 UNIT/ML IV SOLN: 500.0000 [IU] | INTRAVENOUS | Status: DC | PRN

## 2011-11-20 MED ORDER — HEPARIN SOD (PORK) LOCK FLUSH 100 UNIT/ML IV SOLN
500.0000 [IU] | INTRAVENOUS | Status: DC
  Filled 2011-11-20: qty 5

## 2011-11-20 MED ORDER — MIDAZOLAM HCL 2 MG/2ML IJ SOLN
INTRAMUSCULAR | Status: AC
  Filled 2011-11-20: qty 6

## 2011-11-20 MED ORDER — FENTANYL CITRATE 0.05 MG/ML IJ SOLN
INTRAMUSCULAR | Status: AC
  Filled 2011-11-20: qty 6

## 2011-11-20 MED ORDER — HEPARIN SOD (PORK) LOCK FLUSH 100 UNIT/ML IV SOLN
INTRAVENOUS | Status: AC
  Administered 2011-11-20: 500 [IU]
  Filled 2011-11-20: qty 5

## 2011-11-20 MED ORDER — HEPARIN SOD (PORK) LOCK FLUSH 100 UNIT/ML IV SOLN
500.0000 [IU] | INTRAVENOUS | Status: AC | PRN
  Administered 2011-11-20: 500 [IU]

## 2011-11-20 MED ORDER — MIDAZOLAM HCL 5 MG/5ML IJ SOLN
INTRAMUSCULAR | Status: AC | PRN
  Administered 2011-11-20 (×2): 1 mg via INTRAVENOUS

## 2011-11-20 NOTE — H&P (Signed)
Jon Shannon is an 46 y.o. male.   Chief Complaint: Hodgkin's Lymphoma HPI: Pt needs bone marrow bx for Hodgkins staging. Scheduled for CTBM bx with IR. Chart reviewed.  Past Medical History  Diagnosis Date  . Hyperlipidemia 08/07/2011  . Hodgkin's lymphoma 11/09/2011  . HIV INFECTION 11/30/2009    no history of opportunistic infection  . HYPERTENSION 11/30/2009  . Hypertriglyceridemia   . PTSD (post-traumatic stress disorder)   . DJD (degenerative joint disease)   . Hepatitis A     resolved.     Past Surgical History  Procedure Date  . Tonsillectomy   . Left cervical node excisional biopsy 10/2011  . Left index finger trauma repair     Family History  Problem Relation Age of Onset  . Cancer Mother 26    breast cancer   . Seizures Mother   . Hypertension Mother   . Hyperlipidemia Mother   . Diabetes Father   . Hypertension Father   . Hyperlipidemia Father   . Cancer Father     brain tumor (unknown type)  . Cancer Maternal Aunt     lung cancer  . Cancer Maternal Grandmother     CLL   Social History:  reports that he quit smoking about 4 months ago. His smoking use included Cigarettes. He has never used smokeless tobacco. He reports that he does not drink alcohol or use illicit drugs.  Allergies:  Allergies  Allergen Reactions  . Shrimp (Shellfish Allergy) Anaphylaxis    Medications Prior to Admission  Medication Sig Dispense Refill  . bisoprolol-hydrochlorothiazide (ZIAC) 5-6.25 MG per tablet Take 1 tablet by mouth every morning.       . Cholecalciferol (VITAMIN D3) 1000 UNITS tablet Take 1,000 Units by mouth daily.        . diphenhydrAMINE (BENADRYL) 25 MG tablet Take 25 mg by mouth at bedtime as needed. For sleep      . emtricitabine-tenofovir (TRUVADA) 200-300 MG per tablet Take 1 tablet by mouth daily.      Marland Kitchen escitalopram (LEXAPRO) 10 MG tablet Take 10 mg by mouth daily.        Marland Kitchen GARLIC OIL PO Take 1 tablet by mouth daily.      Marland Kitchen gemfibrozil (LOPID) 600 MG  tablet Take 600 mg by mouth 2 (two) times daily.      Marland Kitchen HYDROcodone-acetaminophen (NORCO) 5-325 MG per tablet Take 1 tablet by mouth every 6 (six) hours as needed. For pain      . loratadine (CLARITIN) 10 MG tablet Take 10 mg by mouth daily.        Marland Kitchen LORazepam (ATIVAN) 0.5 MG tablet Take 1 tablet (0.5 mg total) by mouth every 6 (six) hours as needed (Anticipation Nausea or vomiting).  30 tablet  0  . ondansetron (ZOFRAN) 8 MG tablet Take 1 tab two times a day starting the day after chemo for 3 days. Then take 1 tab two times a day as needed for nausea or vomiting.  30 tablet  1  . prochlorperazine (COMPAZINE) 10 MG tablet Take 1 tablet (10 mg total) by mouth every 6 (six) hours as needed (Nausea or vomiting).  30 tablet  1  . raltegravir (ISENTRESS) 400 MG tablet Take 400 mg by mouth 2 (two) times daily.      . vitamin C (ASCORBIC ACID) 500 MG tablet Take 1,000 mg by mouth daily.       Medications Prior to Admission  Medication Dose Route Frequency Provider Last Rate Last  Dose  . 0.9 %  sodium chloride infusion   Intravenous Once Robet Leu, PA      . heparin lock flush 100 unit/mL  500 Units Intracatheter Prior to discharge Brayton El, PA        No results found for this or any previous visit (from the past 48 hour(s)). No results found.  Review of Systems  Constitutional: Positive for malaise/fatigue. Negative for fever and chills.  Respiratory: Negative for cough, hemoptysis and shortness of breath.   Cardiovascular: Negative for chest pain, palpitations and orthopnea.  Gastrointestinal: Negative for nausea, vomiting, abdominal pain, diarrhea and constipation.  Genitourinary: Negative for dysuria and hematuria.  Musculoskeletal: Negative for myalgias.  Psychiatric/Behavioral: The patient has insomnia.     There were no vitals taken for this visit. Physical Exam  Constitutional: He is oriented to person, place, and time. He appears well-developed and well-nourished. No  distress.  Neck: Normal range of motion. No tracheal deviation present. No thyromegaly present.  Cardiovascular: Normal rate, regular rhythm and normal heart sounds.   No murmur heard. Respiratory: Effort normal and breath sounds normal. No respiratory distress. He has no wheezes.  GI: Soft. Bowel sounds are normal. He exhibits no distension. There is no tenderness.  Neurological: He is alert and oriented to person, place, and time.  Psychiatric: He has a normal mood and affect. His behavior is normal. Judgment and thought content normal.     Assessment/Plan Hodgkins lymphoma For CT guided bone marrow biopsy today. Procedure including risks, complications, discussed with pt. Consent signed in chart.  Allayne Butcher 11/20/2011, 8:27 AM

## 2011-11-20 NOTE — Progress Notes (Signed)
Ambulated to BR with minimal assist and tolerated this well. dsg post sacral area CDI

## 2011-11-20 NOTE — Discharge Instructions (Signed)
Bone Marrow Aspiration, Bone Marrow Biopsy Care After Read the instructions outlined below and refer to this sheet in the next few weeks. These discharge instructions provide you with general information on caring for yourself after you leave the hospital. Your caregiver may also give you specific instructions. While your treatment has been planned according to the most current medical practices available, unavoidable complications occasionally occur. If you have any problems or questions after discharge, call your caregiver. FINDING OUT THE RESULTS OF YOUR TEST Not all test results are available during your visit. If your test results are not back during the visit, make an appointment with your caregiver to find out the results. Do not assume everything is normal if you have not heard from your caregiver or the medical facility. It is important for you to follow up on all of your test results.  HOME CARE INSTRUCTIONS  You have had sedation and may be sleepy or dizzy. Your thinking may not be as clear as usual. For the next 24 hours:  Only take over-the-counter or prescription medicines for pain, discomfort, and or fever as directed by your caregiver.   Do not drink alcohol.   Do not smoke.   Do not drive.   Do not make important legal decisions.   Do not operate heavy machinery.   Do not care for small children by yourself.   Keep your dressing clean and dry. You may replace dressing with a bandage after 24 hours.   You may take a bath or shower after 24 hours.   Use an ice pack for 20 minutes every 2 hours while awake for pain as needed.  SEEK MEDICAL CARE IF:   There is redness, swelling, or increasing pain at the biopsy site.   There is pus coming from the biopsy site.   There is drainage from a biopsy site lasting longer than one day.   An unexplained oral temperature above 102 F (38.9 C) develops.  SEEK IMMEDIATE MEDICAL CARE IF:   You develop a rash.   You have  difficulty breathing.   You develop any reaction or side effects to medications given.  Document Released: 02/03/2005 Document Revised: 07/06/2011 Document Reviewed: 07/14/2008 [employer] Patient Information 2012 [employer].  Moderate Sedation, Adult Moderate sedation is given to help you relax or even sleep through a procedure. You may remain sleepy, be clumsy, or have poor balance for several hours following this procedure. Arrange for a responsible adult, family member, or friend to take you home. A responsible adult should stay with you for at least 24 hours or until the medicines have worn off.  Do not participate in any activities where you could become injured for the next 24 hours, or until you feel normal again. Do not:   Drive.   Swim.   Ride a bicycle.   Operate heavy machinery.   Cook.   Use power tools.   Climb ladders.   Work at heights.   Do not make important decisions or sign legal documents until you are improved.   Vomiting may occur if you eat too soon. When you can drink without vomiting, try water, juice, or soup. Try solid foods if you feel little or no nausea.   Only take over-the-counter or prescription medications for pain, discomfort, or fever as directed by your caregiver.If pain medications have been prescribed for you, ask your caregiver how soon it is safe to take them.   Make sure you and your family fully understands   everything about the medication given to you. Make sure you understand what side effects may occur.   You should not drink alcohol, take sleeping pills, or medications that cause drowsiness for at least 24 hours.   If you smoke, do not smoke alone.   If you are feeling better, you may resume normal activities 24 hours after receiving sedation.   Keep all appointments as scheduled. Follow all instructions.   Ask questions if you do not understand.  SEEK MEDICAL CARE IF:   Your skin is pale or bluish in color.   You  continue to feel sick to your stomach (nauseous) or throw up (vomit).   Your pain is getting worse and not helped by medication.   You have bleeding or swelling.   You are still sleepy or feeling clumsy after 24 hours.  SEEK IMMEDIATE MEDICAL CARE IF:   You develop a rash.   You have difficulty breathing.   You develop any type of allergic problem.   You have a fever.  Document Released: 04/11/2001 Document Revised: 07/06/2011 Document Reviewed: 09/02/2007 [employer] Patient Information 2012 [employer]. 

## 2011-11-20 NOTE — Procedures (Signed)
CT guided bone marrow aspirates and core biopsy.  No immediate complication.   

## 2011-11-21 NOTE — Progress Notes (Signed)
Received copy of Bone Marrow Biopsy, from [facility]; forwarded to Dr. Gaylyn Rong.

## 2011-11-22 ENCOUNTER — Other Ambulatory Visit: Payer: 59

## 2011-11-22 ENCOUNTER — Other Ambulatory Visit: Payer: Self-pay | Admitting: *Deleted

## 2011-11-22 ENCOUNTER — Encounter: Payer: Self-pay | Admitting: *Deleted

## 2011-11-22 MED ORDER — LIDOCAINE-PRILOCAINE 2.5-2.5 % EX CREA: TOPICAL_CREAM | CUTANEOUS | Status: DC

## 2011-11-24 ENCOUNTER — Ambulatory Visit (HOSPITAL_BASED_OUTPATIENT_CLINIC_OR_DEPARTMENT_OTHER): Payer: 59 | Admitting: Oncology

## 2011-11-24 ENCOUNTER — Other Ambulatory Visit: Payer: Self-pay | Admitting: Emergency Medicine

## 2011-11-24 ENCOUNTER — Ambulatory Visit (HOSPITAL_BASED_OUTPATIENT_CLINIC_OR_DEPARTMENT_OTHER): Payer: 59

## 2011-11-24 ENCOUNTER — Telehealth: Payer: Self-pay | Admitting: Oncology

## 2011-11-24 ENCOUNTER — Ambulatory Visit: Payer: 59 | Admitting: Oncology

## 2011-11-24 ENCOUNTER — Encounter (HOSPITAL_COMMUNITY)
Admission: RE | Admit: 2011-11-24 | Discharge: 2011-11-24 | Disposition: A | Discharge status: Home / Self Care | Payer: 59 | Source: Ambulatory Visit | Attending: Oncology | Admitting: Oncology

## 2011-11-24 ENCOUNTER — Encounter (HOSPITAL_COMMUNITY): Payer: Self-pay

## 2011-11-24 VITALS — BP 140/91 | HR 79 | Temp 98.0°F | Ht 72.0 in | Wt 309.3 lb

## 2011-11-24 VITALS — BP 141/86 | HR 78 | Temp 98.2°F

## 2011-11-24 DIAGNOSIS — B20 Human immunodeficiency virus [HIV] disease: Secondary | ICD-10-CM | POA: Insufficient documentation

## 2011-11-24 DIAGNOSIS — R42 Dizziness and giddiness: Secondary | ICD-10-CM

## 2011-11-24 DIAGNOSIS — R599 Enlarged lymph nodes, unspecified: Secondary | ICD-10-CM | POA: Insufficient documentation

## 2011-11-24 DIAGNOSIS — C8121 Mixed cellularity classical Hodgkin lymphoma, lymph nodes of head, face, and neck: Secondary | ICD-10-CM

## 2011-11-24 DIAGNOSIS — C8190 Hodgkin lymphoma, unspecified, unspecified site: Secondary | ICD-10-CM | POA: Insufficient documentation

## 2011-11-24 DIAGNOSIS — Z5111 Encounter for antineoplastic chemotherapy: Secondary | ICD-10-CM

## 2011-11-24 DIAGNOSIS — F4310 Post-traumatic stress disorder, unspecified: Secondary | ICD-10-CM

## 2011-11-24 DIAGNOSIS — I10 Essential (primary) hypertension: Secondary | ICD-10-CM

## 2011-11-24 LAB — GLUCOSE, CAPILLARY: Glucose-Capillary: 151 mg/dL — ABNORMAL HIGH (ref 70–99)

## 2011-11-24 MED ORDER — DEXAMETHASONE SODIUM PHOSPHATE 4 MG/ML IJ SOLN
20.0000 mg | Freq: Once | INTRAMUSCULAR | Status: AC
  Administered 2011-11-24: 20 mg via INTRAVENOUS

## 2011-11-24 MED ORDER — SODIUM CHLORIDE 0.9 % IJ SOLN
10.0000 mL | INTRAMUSCULAR | Status: DC | PRN
  Administered 2011-11-24: 10 mL
  Filled 2011-11-24: qty 10

## 2011-11-24 MED ORDER — SODIUM CHLORIDE 0.9 % IV SOLN
10.0000 [IU]/m2 | Freq: Once | INTRAVENOUS | Status: AC
  Administered 2011-11-24: 27 [IU] via INTRAVENOUS
  Filled 2011-11-24: qty 9

## 2011-11-24 MED ORDER — FLUDEOXYGLUCOSE F - 18 (FDG) INJECTION
14.8000 | Freq: Once | INTRAVENOUS | Status: AC | PRN
  Administered 2011-11-24: 14.8 via INTRAVENOUS

## 2011-11-24 MED ORDER — SODIUM CHLORIDE 0.9 % IV SOLN
375.0000 mg/m2 | Freq: Once | INTRAVENOUS | Status: AC
  Administered 2011-11-24: 1000 mg via INTRAVENOUS
  Filled 2011-11-24: qty 50

## 2011-11-24 MED ORDER — ONDANSETRON 16 MG/50ML IVPB (CHCC)
16.0000 mg | Freq: Once | INTRAVENOUS | Status: AC
  Administered 2011-11-24: 16 mg via INTRAVENOUS

## 2011-11-24 MED ORDER — VINBLASTINE SULFATE CHEMO INJECTION 1 MG/ML
6.0000 mg/m2 | Freq: Once | INTRAVENOUS | Status: AC
  Administered 2011-11-24: 16 mg via INTRAVENOUS
  Filled 2011-11-24: qty 16

## 2011-11-24 MED ORDER — HEPARIN SOD (PORK) LOCK FLUSH 100 UNIT/ML IV SOLN
500.0000 [IU] | Freq: Once | INTRAVENOUS | Status: AC | PRN
  Administered 2011-11-24: 500 [IU]
  Filled 2011-11-24: qty 5

## 2011-11-24 MED ORDER — DOXORUBICIN HCL CHEMO IV INJECTION 2 MG/ML
25.0000 mg/m2 | Freq: Once | INTRAVENOUS | Status: AC
  Administered 2011-11-24: 66 mg via INTRAVENOUS
  Filled 2011-11-24: qty 33

## 2011-11-24 MED ORDER — SODIUM CHLORIDE 0.9 % IV SOLN
Freq: Once | INTRAVENOUS | Status: AC
  Administered 2011-11-24: 12:00:00 via INTRAVENOUS

## 2011-11-24 NOTE — Telephone Encounter (Signed)
Gv pts appt for [id number]

## 2011-11-24 NOTE — Progress Notes (Signed)
[facility]  [phone] [phone]   OFFICE PROGRESS NOTE   DIAGNOSIS:  At least stage II classical Hodgkins' lymphoma; mixed cellularity type, (at least 3 different areas of disease in bilateral neck).     PAST THERAPY:  Biopsy only   CURRENT THERAPY: here to start ABVD d1, 15 every 4 weeks with Neulasta the day after.   INTERVAL HISTORY: Jon Shannon 46 y.o. male returns to clinic to go over the result of work up and start chemo today.  He is here with his mother and sister.  He reported doing relatively well.  He has mild crampy pain in the portacath site and the bone marrow biopsy site.  He has slight palpable adenopathy in the left more than the right cervical neck.  He does not feel adenopathy anywhere else.  He has some insomnia and has been taking Vicodin for both pain and sleep.  He has not tried Benadryl yet.   Patient denies fatigue, headache, visual changes, confusion, drenching night sweats, palpable lymph node swelling, mucositis, odynophagia, dysphagia, nausea vomiting, jaundice, chest pain, palpitation, shortness of breath, dyspnea on exertion, productive cough, gum bleeding, epistaxis, hematemesis, hemoptysis, abdominal pain, abdominal swelling, early satiety, melena, hematochezia, hematuria, skin rash, spontaneous bleeding, joint swelling, joint pain, heat or cold intolerance, bowel bladder incontinence, back pain, focal motor weakness, paresthesia, depression, suicidal or homocidal ideation, feeling hopelessness.   Past Medical History  Diagnosis Date  . Hyperlipidemia 08/07/2011  . Hodgkin's lymphoma 11/09/2011  . HIV INFECTION 11/30/2009    no history of opportunistic infection  . HYPERTENSION 11/30/2009  . Hypertriglyceridemia   . PTSD (post-traumatic stress disorder)   . DJD (degenerative joint disease)   . Hepatitis A     resolved.     Past Surgical History  Procedure Date  . Tonsillectomy   . Left cervical node excisional  biopsy 10/2011  . Left index finger trauma repair     Current Outpatient Prescriptions  Medication Sig Dispense Refill  . bisoprolol-hydrochlorothiazide (ZIAC) 5-6.25 MG per tablet Take 1 tablet by mouth every morning.       . Cholecalciferol (VITAMIN D3) 1000 UNITS tablet Take 1,000 Units by mouth daily.        . diphenhydrAMINE (BENADRYL) 25 MG tablet Take 25 mg by mouth at bedtime as needed. For sleep      . emtricitabine-tenofovir (TRUVADA) 200-300 MG per tablet Take 1 tablet by mouth daily.      Marland Kitchen escitalopram (LEXAPRO) 10 MG tablet Take 10 mg by mouth daily.        Marland Kitchen GARLIC OIL PO Take 1 tablet by mouth daily.      Marland Kitchen gemfibrozil (LOPID) 600 MG tablet Take 600 mg by mouth 2 (two) times daily.      Marland Kitchen HYDROcodone-acetaminophen (NORCO) 5-325 MG per tablet Take 1 tablet by mouth every 6 (six) hours as needed. For pain      . lidocaine-prilocaine (EMLA) cream Apply to [facility] site one hour prior to needle sticks as needed to numb area.  30 g  2  . loratadine (CLARITIN) 10 MG tablet Take 10 mg by mouth daily.        Marland Kitchen LORazepam (ATIVAN) 0.5 MG tablet Take 1 tablet (0.5 mg total) by mouth every 6 (six) hours as needed (Anticipation Nausea or vomiting).  30 tablet  0  . ondansetron (ZOFRAN) 8 MG tablet Take 1 tab two times a day starting the day after chemo for 3 days.  Then take 1 tab two times a day as needed for nausea or vomiting.  30 tablet  1  . prochlorperazine (COMPAZINE) 10 MG tablet Take 1 tablet (10 mg total) by mouth every 6 (six) hours as needed (Nausea or vomiting).  30 tablet  1  . raltegravir (ISENTRESS) 400 MG tablet Take 400 mg by mouth 2 (two) times daily.      . vitamin C (ASCORBIC ACID) 500 MG tablet Take 1,000 mg by mouth daily.       No current facility-administered medications for this visit.   Facility-Administered Medications Ordered in Other Visits  Medication Dose Route Frequency Provider Last Rate Last Dose  . fludeoxyglucose F - 18 (FDG) injection 14.8 milli  Curie  14.8 milli Curie Intravenous Once PRN Medication Radiologist, MD   14.8 milli Curie at 11/24/11 0910    ALLERGIES:  is allergic to shrimp.  REVIEW OF SYSTEMS:  The rest of the 14-point review of system was negative.   Filed Vitals:   11/24/11 1052  BP: 140/91  Pulse: 79  Temp: 98 F (36.7 C)   Wt Readings from Last 3 Encounters:  11/24/11 309 lb 4.8 oz (140.298 kg)  11/20/11 306 lb (138.801 kg)  11/16/11 310 lb (140.615 kg)   ECOG Performance status: 0  PHYSICAL EXAMINATION:   General: Obese male in no acute distress. Eyes: no scleral icterus. ENT: There were no oropharyngeal lesions. Neck was without thyromegaly. Lymphatics: Negative cervical, supraclavicular or axillary adenopathy. Left cervical neck node excisional scar has healed without any erythema, purulent discharge or pain on palpation. Respiratory: lungs were clear bilaterally without wheezing or crackles. Cardiovascular: Regular rate and rhythm, S1/S2, without murmur, rub or gallop. There was no pedal edema. GI: abdomen was soft, flat, nontender, nondistended, without organomegaly. Muscoloskeletal: no spinal tenderness of palpation of vertebral spine. Skin exam was without echymosis, petichae. Neuro exam was nonfocal. Patient was able to get on and off exam table without assistance. Gait was normal. Patient was alerted and oriented. Attention was good. Language was appropriate. Mood was normal without depression. Speech was not pressured. Thought content was not tangential.     LABORATORY/RADIOLOGY DATA:  Lab Results  Component Value Date   WBC 5.5 11/20/2011   HGB 14.4 11/20/2011   HCT 42.1 11/20/2011   PLT 253 11/20/2011   GLUCOSE 158* 11/14/2011   CHOL 165 09/20/2011   TRIG 385* 09/20/2011   HDL 26* 09/20/2011   LDLCALC 62 09/20/2011   ALKPHOS 69 11/14/2011   ALT 40 11/14/2011   AST 38* 11/14/2011   NA 139 11/14/2011   K 4.0 11/14/2011   CL 101 11/14/2011   CREATININE 1.05 11/14/2011   BUN 11 11/14/2011   CO2 30  11/14/2011   INR 0.98 11/20/2011   PATH:  I discussed with  patient and his relatives that the bone marrow biopsy did not have lymphoma involvement.  I provided them this report.   ECHO:  His EF was 55-60%.   I provided them this report.   PFT:  Not finalized yet.  However FVC and FEV1 and DLCO were within normal range.   I provided them this report.   IMAGING:  I personally reviewed the following PET scan and showed the patient and his relatives the images.  In brief, there was bilateral cervical adenopathy.  There were borderline periaortic adenopathy.  I provided them this report.   Ct Soft Tissue Neck W Contrast  11/17/2011  *RADIOLOGY REPORT*  Clinical Data:  Newly  diagnosed lymphoma.  Enlarged neck lymph nodes.  Initial staging.  Hodgkin's lymphoma appear  CT NECK, CHEST, ABDOMEN AND PELVIS WITH CONTRAST  Technique:  Multidetector CT imaging of the neck, chest, abdomen and pelvis was performed using the standard protocol following the bolus administration of intravenous contrast.  Contrast: OMNIPAQUE IOHEXOL 300 MG/ML  SOLN and  Comparison:  None  CT NECK  Findings:  There are small bilateral cervical lymph nodes.  For example 9 mm short axis left level II lymph node (image 40).  8 mm right level II lymph node (image 38).  9 mm left level III lymph node (image 49).  No submental lymphadenopathy.   The salivary glands and parotid glands appear normal.  There is mild thickening of the platysmus on the left (image 40). This may represent the excisional biopsy site.   Limited view of the inferior brain and orbits are normal.  IMPRESSION: 1. Prominent left and right cervical lymph nodes at upper limits of normal. Left cervical lymph nodes slightly more prominent .  2. Mild thickening of the  platysmus on the left likely represents biopsy site.  CT CHEST  Findings: No axillary or supraclavicular lymphadenopathy.  No mediastinal or hilar lymphadenopathy.  No pericardial fluid. Esophagus is normal.   Review lung parenchyma demonstrates a 5 mm nodule at the left lung base (image 43).  Second 4 mm nodule the left lung base (image 34).  IMPRESSION:  1.  No evidence of lymphadenopathy. 2.  Two small pulmonary nodules at the left lung base appear benign.  Recommend attention on follow-up.  CT ABDOMEN AND PELVIS  Findings: Diffuse fatty infiltration the liver.  No focal hepatic lesion.  Pancreas, gallbladder, spleen, adrenal glands, kidneys are normal.    There are neural small retroperitoneal periaortic lymph nodes. For example 12 mm aortocaval lymph node (image 76).  8 mm left periaortic lymph node (image 79).  No retrocrural adenopathy.   12 mm periportal lymph node is within normal limits.  No evidence of mesenteric lymphadenopathy.  No  peritoneal disease.  The stomach, small bowel, appendix, and cecum are normal.  The colon and rectosigmoid colon are normal.  Abdominal aorta normal caliber.  There is no evidence of pathologically enlarged pelvic lymph nodes. There is a 8 mm x 24 mm left obturator node (image 116).  Small inguinal lymph nodes are not pathologic by size criteria.  The prostate gland and bladder are normal. Bilateral inguinal fat filled hernias.  The Review of  bone windows demonstrates no aggressive osseous lesions.  There is sclerotic lesion in the right iliac bone which appears benign.  Hemangioma within the L4 vertebral body.  Second hemangioma within T10.  IMPRESSION:  1. Prominent periaortic lymph nodes are borderline enlarged. 2.  Single prominent borderline enlarged left obturator lymph node 3.  Spleen appears normal. 4.  Hepatic steatosis.  Original Report Authenticated By: Genevive Bi, M.D.   Ct Chest W Contrast  11/17/2011  *RADIOLOGY REPORT*  Clinical Data:  Newly diagnosed lymphoma.  Enlarged neck lymph nodes.  Initial staging.  Hodgkin's lymphoma appear  CT NECK, CHEST, ABDOMEN AND PELVIS WITH CONTRAST  Technique:  Multidetector CT imaging of the neck, chest, abdomen and pelvis  was performed using the standard protocol following the bolus administration of intravenous contrast.  Contrast: OMNIPAQUE IOHEXOL 300 MG/ML  SOLN and  Comparison:  None  CT NECK  Findings:  There are small bilateral cervical lymph nodes.  For example 9 mm short axis left  level II lymph node (image 40).  8 mm right level II lymph node (image 38).  9 mm left level III lymph node (image 49).  No submental lymphadenopathy.   The salivary glands and parotid glands appear normal.  There is mild thickening of the platysmus on the left (image 40). This may represent the excisional biopsy site.   Limited view of the inferior brain and orbits are normal.  IMPRESSION: 1. Prominent left and right cervical lymph nodes at upper limits of normal. Left cervical lymph nodes slightly more prominent .  2. Mild thickening of the  platysmus on the left likely represents biopsy site.  CT CHEST  Findings: No axillary or supraclavicular lymphadenopathy.  No mediastinal or hilar lymphadenopathy.  No pericardial fluid. Esophagus is normal.  Review lung parenchyma demonstrates a 5 mm nodule at the left lung base (image 43).  Second 4 mm nodule the left lung base (image 34).  IMPRESSION:  1.  No evidence of lymphadenopathy. 2.  Two small pulmonary nodules at the left lung base appear benign.  Recommend attention on follow-up.  CT ABDOMEN AND PELVIS  Findings: Diffuse fatty infiltration the liver.  No focal hepatic lesion.  Pancreas, gallbladder, spleen, adrenal glands, kidneys are normal.    There are neural small retroperitoneal periaortic lymph nodes. For example 12 mm aortocaval lymph node (image 76).  8 mm left periaortic lymph node (image 79).  No retrocrural adenopathy.   12 mm periportal lymph node is within normal limits.  No evidence of mesenteric lymphadenopathy.  No  peritoneal disease.  The stomach, small bowel, appendix, and cecum are normal.  The colon and rectosigmoid colon are normal.  Abdominal aorta normal caliber.   There is no evidence of pathologically enlarged pelvic lymph nodes. There is a 8 mm x 24 mm left obturator node (image 116).  Small inguinal lymph nodes are not pathologic by size criteria.  The prostate gland and bladder are normal. Bilateral inguinal fat filled hernias.  The Review of  bone windows demonstrates no aggressive osseous lesions.  There is sclerotic lesion in the right iliac bone which appears benign.  Hemangioma within the L4 vertebral body.  Second hemangioma within T10.  IMPRESSION:  1. Prominent periaortic lymph nodes are borderline enlarged. 2.  Single prominent borderline enlarged left obturator lymph node 3.  Spleen appears normal. 4.  Hepatic steatosis.  Original Report Authenticated By: Genevive Bi, M.D.   Ct Abdomen Pelvis W Contrast  11/17/2011  *RADIOLOGY REPORT*  Clinical Data:  Newly diagnosed lymphoma.  Enlarged neck lymph nodes.  Initial staging.  Hodgkin's lymphoma appear  CT NECK, CHEST, ABDOMEN AND PELVIS WITH CONTRAST  Technique:  Multidetector CT imaging of the neck, chest, abdomen and pelvis was performed using the standard protocol following the bolus administration of intravenous contrast.  Contrast: OMNIPAQUE IOHEXOL 300 MG/ML  SOLN and  Comparison:  None  CT NECK  Findings:  There are small bilateral cervical lymph nodes.  For example 9 mm short axis left level II lymph node (image 40).  8 mm right level II lymph node (image 38).  9 mm left level III lymph node (image 49).  No submental lymphadenopathy.   The salivary glands and parotid glands appear normal.  There is mild thickening of the platysmus on the left (image 40). This may represent the excisional biopsy site.   Limited view of the inferior brain and orbits are normal.  IMPRESSION: 1. Prominent left and right cervical lymph nodes at upper  limits of normal. Left cervical lymph nodes slightly more prominent .  2. Mild thickening of the  platysmus on the left likely represents biopsy site.  CT CHEST   Findings: No axillary or supraclavicular lymphadenopathy.  No mediastinal or hilar lymphadenopathy.  No pericardial fluid. Esophagus is normal.  Review lung parenchyma demonstrates a 5 mm nodule at the left lung base (image 43).  Second 4 mm nodule the left lung base (image 34).  IMPRESSION:  1.  No evidence of lymphadenopathy. 2.  Two small pulmonary nodules at the left lung base appear benign.  Recommend attention on follow-up.  CT ABDOMEN AND PELVIS  Findings: Diffuse fatty infiltration the liver.  No focal hepatic lesion.  Pancreas, gallbladder, spleen, adrenal glands, kidneys are normal.    There are neural small retroperitoneal periaortic lymph nodes. For example 12 mm aortocaval lymph node (image 76).  8 mm left periaortic lymph node (image 79).  No retrocrural adenopathy.   12 mm periportal lymph node is within normal limits.  No evidence of mesenteric lymphadenopathy.  No  peritoneal disease.  The stomach, small bowel, appendix, and cecum are normal.  The colon and rectosigmoid colon are normal.  Abdominal aorta normal caliber.  There is no evidence of pathologically enlarged pelvic lymph nodes. There is a 8 mm x 24 mm left obturator node (image 116).  Small inguinal lymph nodes are not pathologic by size criteria.  The prostate gland and bladder are normal. Bilateral inguinal fat filled hernias.  The Review of  bone windows demonstrates no aggressive osseous lesions.  There is sclerotic lesion in the right iliac bone which appears benign.  Hemangioma within the L4 vertebral body.  Second hemangioma within T10.  IMPRESSION:  1. Prominent periaortic lymph nodes are borderline enlarged. 2.  Single prominent borderline enlarged left obturator lymph node 3.  Spleen appears normal. 4.  Hepatic steatosis.  Original Report Authenticated By: Genevive Bi, M.D.    ASSESSMENT AND PLAN:  1. HIV: He is on Truvada and Isentress per ID.  As previously checked, there was no common drug interaction between ABVD and  these HIV meds.   2. Hyperlipidemia: He is on gemfibrozil.   3. PTSD:  He is on Lexapro with his psychiatrist.  I appreciate SW visit with him today.   4. Hypertension: Good control with Ziac.  5. Newly diagnosed left cervical neck node classical Hodgkins' lymphoma.  - Staging: at least stage II given bilateral cervical neck node with 3 different areas of involvement (nonbulky disease, normal ESR).   - Prognosis: To be determined pending PET scan result.  - Treatment:  I again discussed with Mr. Aymond and his relatives that Hodgkin's lymphoma is a systemic disease and is only cured with chemo.  ABVD is easier to tolerate than BEACOPP.   And given his HIV status, I prefer ABVD over BEACOPP to minimize side effects.  If PET scan shows no involvement of periaortic disease, then he is stage II.  Treatment would be 4 cycles of ABVD followed by bilateral neck radiation.  If he has stage III disease, then we will pursue 6 cycles of ABVD.  I discussed with pt and relatives that PET scan also has limit of detection with node <2cm3.  Thus, if the periaortic nodes are not FDG avid, there can be still a chance that they are positive and not yet FDG avid.  We will discuss the staging information at the future visit once PET scan result is available.  I again discussed with patient potential side effects of ABVD chemo regimen include but not limited to: Fatigue, hair loss, mouth sore, nausea/vomiting, congestive heart failure, numbness and tingling of fingers/toes; low blood count, infection, bleeding, infertility, pneumonitis, secondary cancer (low risk of this).   Mr. Mazon and his relatives expressed informed understanding and with chemo today.  He has had PFT and echo with normal functions.  He has at home antiemetics with Compazine/Zofran/Ativan prn.   6. Follow up: Return to clinic in about 10 days to go over work up and start chemo.

## 2011-11-25 ENCOUNTER — Ambulatory Visit (HOSPITAL_BASED_OUTPATIENT_CLINIC_OR_DEPARTMENT_OTHER): Payer: 59

## 2011-11-25 VITALS — BP 128/79 | HR 71 | Temp 98.0°F

## 2011-11-25 DIAGNOSIS — C8190 Hodgkin lymphoma, unspecified, unspecified site: Secondary | ICD-10-CM

## 2011-11-25 DIAGNOSIS — Z5189 Encounter for other specified aftercare: Secondary | ICD-10-CM

## 2011-11-25 MED ORDER — PEGFILGRASTIM INJECTION 6 MG/0.6ML
6.0000 mg | Freq: Once | SUBCUTANEOUS | Status: AC
  Administered 2011-11-25: 6 mg via SUBCUTANEOUS

## 2011-11-25 NOTE — Patient Instructions (Signed)
Patient aware of next appointment; discharged home with no complaints. 

## 2011-11-27 ENCOUNTER — Telehealth: Payer: Self-pay

## 2011-11-27 NOTE — Telephone Encounter (Signed)
Message copied by Abby Potash on Mon Nov 27, 2011  2:14 PM ------      Message from: Lorenza Evangelist A      Created: Fri Nov 24, 2011  3:27 PM      Regarding: Chemo follow up call       First time AVBD. Dr Gaylyn Rong. Patient's CBG was 286 in clinic. Instructed to follow up with primary for glucose control.  Thanks!!!!

## 2011-11-27 NOTE — Telephone Encounter (Signed)
Called pt re: chemo f/u call.  Pt states he had a temp of 100.3 yesterday, called on-call MD and was told to take advil, did take claritin as well, after neulasta injection.  Pt states his temp was 99.6 this morning, and he did take advil again.  Pt states he has been avoiding crowds, people with infection, and practicing frequent handwashing.  Pt states he has been "achy," and informed him this was from neulasta and should improve.  Pt also states he has been nauseated, has been taking anti-emetics, but has not had much of an appetite.  Pt states he has been drinking fluids.  Suggested pt try small frequent meals, and bland foods, avoid spicy, greasy, or fried foods.  Pt denies diarrhea, states his BM today was "loose," but not water.  Pt states he had previously had a hard time having a BM, but did not require laxatives.  Informed pt to call office if he develops frequent, loose, watery BM's.  Encouraged pt to increase fluids to stay hydrated.  Pt was currently at PCP office for evaluation of his blood glucose.  Pt states he has drug handouts available, and knows to call office if any problems.  MD made aware of low-grade temp.

## 2011-11-29 ENCOUNTER — Telehealth: Payer: Self-pay | Admitting: *Deleted

## 2011-11-29 NOTE — Telephone Encounter (Signed)
PCP Esperanza Richters, PA seeing pt in [place].  Pt having elevated blood glucose d/t lymphoma treatment.  Wanted to make sure that any new rx would not interact with current medications.

## 2011-11-30 ENCOUNTER — Telehealth: Payer: Self-pay | Admitting: *Deleted

## 2011-11-30 NOTE — Telephone Encounter (Signed)
Called pt regarding pain. He states woke up early this morning w/ back/ spine, bilat hip and thigh pain.  Dr. Gaylyn Rong is aware and it is likely side effect from Neulasta.  Instructed pt to take tylenol prn and his vicodin if needed.  Pt states still has some vicodin at home.  Instructed pt to call back if pain worsens or changes, but should resolve w/i a few days.  Encouraged to drink plenty of fluids and continue to take claritan daily.  He verbalized understanding.

## 2011-11-30 NOTE — Telephone Encounter (Signed)
THE CONSTANT ACHE IS AT A SEVEN THEN SHARP PAIN AT A NINE. PT. TRIED A HEATING PAD, ADVIL, AND CLARITIN BUT NO RELIEVE. HE HAS HAD SIMILAR PAIN TEN YEARS AGO WITH A DEGENERATIVE DISC OR IS IT HIS CHEMO/NEULASTA? SUGGESTIONS. THIS NOTE TO DR.HA'S ACTIVE WORK BLUE FOLDER.

## 2011-12-01 ENCOUNTER — Telehealth: Payer: Self-pay | Admitting: *Deleted

## 2011-12-01 NOTE — Telephone Encounter (Signed)
Darl Pikes at [facility] called a second time requesting that Dr. Ninetta Lights call patient's PA, Esperanza Richters, she spoke to [name], RN  previously.  The voicemail stated that Ramon Dredge would be out of office on 12/01/11, and would return on Monday 12/04/11. I called Darl Pikes back and let her know that Dr. Ninetta Lights will not be in the clinic Monday, he will be at a conference. Will talk to Dr. Ninetta Lights about this matter and ask him to call [facility], [phone]. Wendall Mola CMA

## 2011-12-08 ENCOUNTER — Ambulatory Visit (HOSPITAL_BASED_OUTPATIENT_CLINIC_OR_DEPARTMENT_OTHER): Payer: 59

## 2011-12-08 ENCOUNTER — Ambulatory Visit (HOSPITAL_BASED_OUTPATIENT_CLINIC_OR_DEPARTMENT_OTHER): Payer: 59 | Admitting: Oncology

## 2011-12-08 ENCOUNTER — Encounter: Payer: Self-pay | Admitting: Oncology

## 2011-12-08 ENCOUNTER — Ambulatory Visit: Payer: 59

## 2011-12-08 ENCOUNTER — Other Ambulatory Visit (HOSPITAL_BASED_OUTPATIENT_CLINIC_OR_DEPARTMENT_OTHER): Payer: 59 | Admitting: Lab

## 2011-12-08 VITALS — BP 137/89 | HR 74 | Temp 97.5°F | Ht 72.0 in | Wt 302.8 lb

## 2011-12-08 DIAGNOSIS — C8191 Hodgkin lymphoma, unspecified, lymph nodes of head, face, and neck: Secondary | ICD-10-CM

## 2011-12-08 DIAGNOSIS — C8190 Hodgkin lymphoma, unspecified, unspecified site: Secondary | ICD-10-CM

## 2011-12-08 DIAGNOSIS — Z5111 Encounter for antineoplastic chemotherapy: Secondary | ICD-10-CM

## 2011-12-08 DIAGNOSIS — B20 Human immunodeficiency virus [HIV] disease: Secondary | ICD-10-CM

## 2011-12-08 DIAGNOSIS — D6959 Other secondary thrombocytopenia: Secondary | ICD-10-CM

## 2011-12-08 LAB — COMPREHENSIVE METABOLIC PANEL
Albumin: 4.7 g/dL (ref 3.5–5.2)
CO2: 28 mEq/L (ref 19–32)
Glucose, Bld: 148 mg/dL — ABNORMAL HIGH (ref 70–99)
Sodium: 135 mEq/L (ref 135–145)
Total Bilirubin: 0.4 mg/dL (ref 0.3–1.2)
Total Protein: 7.4 g/dL (ref 6.0–8.3)

## 2011-12-08 LAB — CBC WITH DIFFERENTIAL/PLATELET
Basophils Absolute: 0.1 10*3/uL (ref 0.0–0.1)
Eosinophils Absolute: 0 10*3/uL (ref 0.0–0.5)
HCT: 39.3 % (ref 38.4–49.9)
HGB: 13.8 g/dL (ref 13.0–17.1)
LYMPH%: 34.7 % (ref 14.0–49.0)
MCV: 86.8 fL (ref 79.3–98.0)
MONO%: 8.1 % (ref 0.0–14.0)
NEUT#: 3.8 10*3/uL (ref 1.5–6.5)
Platelets: 127 10*3/uL — ABNORMAL LOW (ref 140–400)
RDW: 13 % (ref 11.0–14.6)

## 2011-12-08 LAB — LACTATE DEHYDROGENASE: LDH: 230 U/L (ref 94–250)

## 2011-12-08 MED ORDER — DOXORUBICIN HCL CHEMO IV INJECTION 2 MG/ML
25.0000 mg/m2 | Freq: Once | INTRAVENOUS | Status: AC
  Administered 2011-12-08: 66 mg via INTRAVENOUS
  Filled 2011-12-08: qty 33

## 2011-12-08 MED ORDER — DEXAMETHASONE SODIUM PHOSPHATE 4 MG/ML IJ SOLN
20.0000 mg | Freq: Once | INTRAMUSCULAR | Status: AC
  Administered 2011-12-08: 20 mg via INTRAVENOUS

## 2011-12-08 MED ORDER — SODIUM CHLORIDE 0.9 % IV SOLN
375.0000 mg/m2 | Freq: Once | INTRAVENOUS | Status: AC
  Administered 2011-12-08: 1000 mg via INTRAVENOUS
  Filled 2011-12-08: qty 50

## 2011-12-08 MED ORDER — SODIUM CHLORIDE 0.9 % IJ SOLN
10.0000 mL | INTRAMUSCULAR | Status: DC | PRN
  Administered 2011-12-08: 10 mL
  Filled 2011-12-08: qty 10

## 2011-12-08 MED ORDER — VINBLASTINE SULFATE CHEMO INJECTION 1 MG/ML
6.0000 mg/m2 | Freq: Once | INTRAVENOUS | Status: AC
  Administered 2011-12-08: 16 mg via INTRAVENOUS
  Filled 2011-12-08: qty 16

## 2011-12-08 MED ORDER — SODIUM CHLORIDE 0.9 % IV SOLN
Freq: Once | INTRAVENOUS | Status: AC
  Administered 2011-12-08: 12:00:00 via INTRAVENOUS

## 2011-12-08 MED ORDER — SODIUM CHLORIDE 0.9 % IV SOLN
10.0000 [IU]/m2 | Freq: Once | INTRAVENOUS | Status: AC
  Administered 2011-12-08: 27 [IU] via INTRAVENOUS
  Filled 2011-12-08: qty 9

## 2011-12-08 MED ORDER — ONDANSETRON 16 MG/50ML IVPB (CHCC)
16.0000 mg | Freq: Once | INTRAVENOUS | Status: AC
  Administered 2011-12-08: 16 mg via INTRAVENOUS

## 2011-12-08 MED ORDER — HEPARIN SOD (PORK) LOCK FLUSH 100 UNIT/ML IV SOLN
500.0000 [IU] | Freq: Once | INTRAVENOUS | Status: AC | PRN
  Administered 2011-12-08: 500 [IU]
  Filled 2011-12-08: qty 5

## 2011-12-08 NOTE — Progress Notes (Signed)
[facility]  Telephone:([phone] Fax:([phone]   OFFICE PROGRESS NOTE   DIAGNOSIS:  At least stage II classical Hodgkins' lymphoma; mixed cellularity type, (at least 3 different areas of disease in bilateral neck).     PAST THERAPY:  Biopsy only   CURRENT THERAPY: ABVD d1, 15 every 4 weeks with Neulasta the day after. ABVD was started on 11/24/11.  INTERVAL HISTORY: Jon Shannon 46 y.o. male returns with his mother for cycle 1 day 15 of ABVD. He experienced increased fatigue following his first doe of chemotherapy. He was still able to work, however. He had arthralgias following his Neulasta injection. Using Vicodin as needed. Denies chest pain, shortness of breath, dyspnea. No nausea, vomiting, or abdominal pain. Has neuropathy to his feet, but this was present before treatment and has not worsened. No neuropathy to his fingertips. He has some insomnia and has been taking Vicodin for both pain and sleep.    Patient denies fatigue, headache, visual changes, confusion, drenching night sweats, palpable lymph node swelling, mucositis, odynophagia, dysphagia, nausea vomiting, jaundice, chest pain, palpitation, shortness of breath, dyspnea on exertion, productive cough, gum bleeding, epistaxis, hematemesis, hemoptysis, abdominal pain, abdominal swelling, early satiety, melena, hematochezia, hematuria, skin rash, spontaneous bleeding, joint swelling, joint pain, heat or cold intolerance, bowel bladder incontinence, back pain, focal motor weakness, paresthesia, depression, suicidal or homocidal ideation, feeling hopelessness.   Past Medical History  Diagnosis Date  . Hyperlipidemia 08/07/2011  . Hodgkin's lymphoma 11/09/2011  . HIV INFECTION 11/30/2009    no history of opportunistic infection  . HYPERTENSION 11/30/2009  . Hypertriglyceridemia   . PTSD (post-traumatic stress disorder)   . DJD (degenerative joint disease)   . Hepatitis A     resolved.     Past Surgical  History  Procedure Date  . Tonsillectomy   . Left cervical node excisional biopsy 10/2011  . Left index finger trauma repair     Current Outpatient Prescriptions  Medication Sig Dispense Refill  . bisoprolol-hydrochlorothiazide (ZIAC) 5-6.25 MG per tablet Take 1 tablet by mouth every morning.       . Cholecalciferol (VITAMIN D3) 1000 UNITS tablet Take 1,000 Units by mouth daily.        . diphenhydrAMINE (BENADRYL) 25 MG tablet Take 25 mg by mouth at bedtime as needed. For sleep      . emtricitabine-tenofovir (TRUVADA) 200-300 MG per tablet Take 1 tablet by mouth daily.      Marland Kitchen escitalopram (LEXAPRO) 10 MG tablet Take 10 mg by mouth daily.        Marland Kitchen GARLIC OIL PO Take 1 tablet by mouth daily.      Marland Kitchen gemfibrozil (LOPID) 600 MG tablet Take 600 mg by mouth 2 (two) times daily.      Marland Kitchen HYDROcodone-acetaminophen (NORCO) 5-325 MG per tablet Take 1 tablet by mouth every 6 (six) hours as needed. For pain      . lidocaine-prilocaine (EMLA) cream Apply to [facility] site one hour prior to needle sticks as needed to numb area.  30 g  2  . loratadine (CLARITIN) 10 MG tablet Take 10 mg by mouth daily.        Marland Kitchen LORazepam (ATIVAN) 0.5 MG tablet Take 1 tablet (0.5 mg total) by mouth every 6 (six) hours as needed (Anticipation Nausea or vomiting).  30 tablet  0  . ondansetron (ZOFRAN) 8 MG tablet Take 1 tab two times a day starting the day after chemo for 3 days. Then take 1  tab two times a day as needed for nausea or vomiting.  30 tablet  1  . prochlorperazine (COMPAZINE) 10 MG tablet Take 1 tablet (10 mg total) by mouth every 6 (six) hours as needed (Nausea or vomiting).  30 tablet  1  . raltegravir (ISENTRESS) 400 MG tablet Take 400 mg by mouth 2 (two) times daily.      . vitamin C (ASCORBIC ACID) 500 MG tablet Take 1,000 mg by mouth daily.       No current facility-administered medications for this visit.   Facility-Administered Medications Ordered in Other Visits  Medication Dose Route Frequency Provider  Last Rate Last Dose  . 0.9 %  sodium chloride infusion   Intravenous Once Exie Parody, MD      . bleomycin (BLEOCIN) 27 Units in sodium chloride 0.9 % 50 mL chemo infusion  10 Units/m2 (Treatment Plan Actual) Intravenous Once Exie Parody, MD 354 mL/hr at 12/08/11 1320 27 Units at 12/08/11 1320  . dacarbazine (DTIC) 1,000 mg in sodium chloride 0.9 % 250 mL chemo infusion  375 mg/m2 (Treatment Plan Actual) Intravenous Once Exie Parody, MD      . dexamethasone (DECADRON) injection 20 mg  20 mg Intravenous Once Exie Parody, MD   20 mg at 12/08/11 1224  . DOXOrubicin (ADRIAMYCIN) chemo injection 66 mg  25 mg/m2 (Treatment Plan Actual) Intravenous Once Exie Parody, MD   66 mg at 12/08/11 1303  . heparin lock flush 100 unit/mL  500 Units Intracatheter Once PRN Exie Parody, MD      . ondansetron (ZOFRAN) IVPB 16 mg  16 mg Intravenous Once Exie Parody, MD   16 mg at 12/08/11 1224  . sodium chloride 0.9 % injection 10 mL  10 mL Intracatheter PRN Exie Parody, MD      . vinBLAStine (VELBAN) chemo injection 16 mg  6 mg/m2 (Treatment Plan Actual) Intravenous Once Exie Parody, MD   16 mg at 12/08/11 1304    ALLERGIES:  is allergic to shrimp.  REVIEW OF SYSTEMS:  The rest of the 14-point review of system was negative.   Filed Vitals:   12/08/11 1128  BP: 137/89  Pulse: 74  Temp: 97.5 F (36.4 C)   Wt Readings from Last 3 Encounters:  12/08/11 302 lb 12.8 oz (137.349 kg)  11/24/11 309 lb 4.8 oz (140.298 kg)  11/20/11 306 lb (138.801 kg)   ECOG Performance status: 0  PHYSICAL EXAMINATION:   General: Obese male in no acute distress. Eyes: no scleral icterus. ENT: There were no oropharyngeal lesions. Neck was without thyromegaly. Lymphatics: Negative cervical, supraclavicular or axillary adenopathy. Left cervical neck node excisional scar has healed without any erythema, purulent discharge or pain on palpation. Respiratory: lungs were clear bilaterally without wheezing or crackles. Cardiovascular: Regular rate and rhythm,  S1/S2, without murmur, rub or gallop. There was no pedal edema. GI: abdomen was soft, flat, nontender, nondistended, without organomegaly. Muscoloskeletal: no spinal tenderness of palpation of vertebral spine. Skin exam was without echymosis, petichae. Neuro exam was nonfocal. Patient was able to get on and off exam table without assistance. Gait was normal. Patient was alerted and oriented. Attention was good. Language was appropriate. Mood was normal without depression. Speech was not pressured. Thought content was not tangential.   LABORATORY/RADIOLOGY DATA:  Lab Results  Component Value Date   WBC 6.8 12/08/2011   HGB 13.8 12/08/2011   HCT 39.3 12/08/2011   PLT 127* 12/08/2011  GLUCOSE 158* 11/14/2011   CHOL 165 09/20/2011   TRIG 385* 09/20/2011   HDL 26* 09/20/2011   LDLCALC 62 09/20/2011   ALKPHOS 69 11/14/2011   ALT 40 11/14/2011   AST 38* 11/14/2011   NA 139 11/14/2011   K 4.0 11/14/2011   CL 101 11/14/2011   CREATININE 1.05 11/14/2011   BUN 11 11/14/2011   CO2 30 11/14/2011   INR 0.98 11/20/2011   RADIOLOGY: *RADIOLOGY REPORT*  Clinical Data: Initial treatment strategy for Hodgkin's lymphoma  with cervical adenopathy.  NUCLEAR MEDICINE PET SKULL BASE TO THIGH  Fasting Blood Glucose: 151  Technique: 14.8 mCi F-18 FDG was injected intravenously. CT data  was obtained and used for attenuation correction and anatomic  localization only. (This was not acquired as a diagnostic CT  examination.) Additional exam technical data entered on  technologist worksheet.  Comparison: CT 11/17/2011  Findings:  Neck: There is a focus of abnormal hypermetabolic activity at the  biopsy site just beneath the left platysmus (image 35) with SUV max  = 4.7. There is a single hypermetabolic left level II lymph node  which measures 11 mm (image 38) with SUV max = 4.0 (image 42 of the  PET imaging, mild misregistration). No additional hypermetabolic  cervical lymph nodes.  Chest: No hypermetabolic axillary  or mediastinal nodes. 6 mm  nodule at the left lung base does not have associated metabolic  activity.  Abdomen / Pelvis:No abnormal hypermetabolic active within the  spleen which is normal size. Small periaortic lymph nodes  described on comparison CT do not have associate hypermetabolic  activity. No abnormal activity within the liver.  Within the pelvis, the left obturator node has questionable  hypermetabolic activity. There is a focus of activity in the  region of the lymph node (image 221) which is not as large as the  lymph node ( SUV max = 4.1).  Skeleton:No focal hypermetabolic activity to suggest skeletal  metastasis.  IMPRESSION:  1. Hypermetabolic activity in the left neck at the site of biopsy  as well as a single left level II lymph node.  2. No evidence metastasis in the thorax. Small pulmonary nodule.  Recommend attention on follow-up.  3. Questionable metabolic activity and left obturator lymph node.  No hypermetabolic activity within the small periaortic lymph  nodes.  Original Report Authenticated By: Genevive Bi, M.D.  ASSESSMENT AND PLAN:  1. HIV: He is on Truvada and Isentress per ID.  As previously checked, there was no common drug interaction between ABVD and these HIV meds.   2. Hyperlipidemia: He is on gemfibrozil.   3. PTSD:  He is on Lexapro with his psychiatrist.    4. Hypertension: Good control with Ziac.  5. Left cervical neck node classical Hodgkins' lymphoma, Stage II: PET scan reviewed with the patient and his mother. There was no evidence of metastasis to the lungs or abdomen. There was questionable metabolic activity in the left obturator node. Plan to give him 4 cycles of ABVD, then refer him to Radiation Oncology. He is experiencing grade 1 fatigue and grade 1 thrombocytopenia. Recommend that he proceed with chemotherapy today without dose modification. He is receiving Neulasta with his chemotherapy for neutropenia prophylaxis.   6.  Nausea/vomiting prophylaxis: He has at home antiemetics with Compazine/Zofran/Ativan prn.   7. Thrombocytopenia: Secondary to chemotherapy. He has no bleeding today. No transfusion indicated.  8. Follow up: In 2 weeks to begin cycle 2 of his chemotherapy.

## 2011-12-09 ENCOUNTER — Ambulatory Visit (HOSPITAL_BASED_OUTPATIENT_CLINIC_OR_DEPARTMENT_OTHER): Payer: 59

## 2011-12-09 VITALS — BP 141/89 | HR 80 | Temp 97.2°F

## 2011-12-09 DIAGNOSIS — C8190 Hodgkin lymphoma, unspecified, unspecified site: Secondary | ICD-10-CM

## 2011-12-09 DIAGNOSIS — C8198 Hodgkin lymphoma, unspecified, lymph nodes of multiple sites: Secondary | ICD-10-CM

## 2011-12-09 DIAGNOSIS — Z5189 Encounter for other specified aftercare: Secondary | ICD-10-CM

## 2011-12-09 MED ORDER — PEGFILGRASTIM INJECTION 6 MG/0.6ML
6.0000 mg | Freq: Once | SUBCUTANEOUS | Status: AC
  Administered 2011-12-09: 6 mg via SUBCUTANEOUS

## 2011-12-11 ENCOUNTER — Telehealth: Payer: Self-pay | Admitting: Oncology

## 2011-12-11 ENCOUNTER — Telehealth: Payer: Self-pay | Admitting: *Deleted

## 2011-12-11 NOTE — Telephone Encounter (Signed)
Spoke with patient today about the completion of financial application let patient know that I am in need of last two paycheck stubs, patient stated that he would print off and faxed them to me.

## 2011-12-11 NOTE — Telephone Encounter (Signed)
Pt requesting phone call to PCP, Dr. Myer Peer at [facility], Marcell Anger to [facility] use of Metformin with pt's HIV medications.   RN called [facility] to confirm taking Metformin with HIV medications.  Message left for Dr. Joneen Caraway assistant.

## 2011-12-18 ENCOUNTER — Telehealth: Payer: Self-pay | Admitting: *Deleted

## 2011-12-18 NOTE — Telephone Encounter (Signed)
Patient called at 10:15am reporting he ate breakfast, took his H.I.V. meds and 20 minutes later vomited everything.  Tried to eat crackers and water and again, vomited."  Asked should he be concerned.  Anti-emetica are at  Home and he is on his way to work.  Reports anti-emetics make him feel drunk and he has to work.  Did not look at the emesis but due to vomiting within 20 minutes he lost the medicines he had taken.  Still feels nausea.   This nurse suggested he try the zofran because the lorazepam and prochlorperazine make him feel drowsy or drunk or he needs to take the day to rest.  Suggested he try gingerale or gatorade with saltines and to try a liquid diet.  He did eat a type of sausage, milk and was instructed to avoid foods that can cause n/v.  ABVD chemo received on 12-08-11.  Denies fever.  Reports starting metformin and this gives him the runs but denies diarrhea or more than three stools per day.  Will notify providers.

## 2011-12-18 NOTE — Telephone Encounter (Signed)
Mid-level Kristen notified of patient's call.  Verbal order received and read back for patient to take his anti-emetics and if needed to take the day off work.  This nurse has already given patient this information with his call.  Called home number to reinforce these instructions.  Asked him if he needs to apply for FMLA and he reports he is on an intermittent FMLA currently.

## 2011-12-19 NOTE — Telephone Encounter (Signed)
His truvada may interact with metformin if the truvada causes renal dysfunction. He will need ot have his Cr monitored as routine.

## 2011-12-20 ENCOUNTER — Encounter: Payer: Self-pay | Admitting: Oncology

## 2011-12-20 NOTE — Progress Notes (Signed)
Patient denied EPP assistance, for family of 1 patient income [phone]

## 2011-12-22 ENCOUNTER — Ambulatory Visit (HOSPITAL_BASED_OUTPATIENT_CLINIC_OR_DEPARTMENT_OTHER): Payer: 59

## 2011-12-22 ENCOUNTER — Ambulatory Visit: Payer: 59

## 2011-12-22 ENCOUNTER — Other Ambulatory Visit (HOSPITAL_BASED_OUTPATIENT_CLINIC_OR_DEPARTMENT_OTHER): Payer: 59 | Admitting: Lab

## 2011-12-22 ENCOUNTER — Ambulatory Visit (HOSPITAL_BASED_OUTPATIENT_CLINIC_OR_DEPARTMENT_OTHER): Payer: 59 | Admitting: Oncology

## 2011-12-22 ENCOUNTER — Telehealth: Payer: Self-pay | Admitting: Oncology

## 2011-12-22 VITALS — BP 139/84 | HR 75 | Temp 97.2°F | Ht 72.0 in | Wt 300.6 lb

## 2011-12-22 DIAGNOSIS — C8190 Hodgkin lymphoma, unspecified, unspecified site: Secondary | ICD-10-CM

## 2011-12-22 DIAGNOSIS — Z5111 Encounter for antineoplastic chemotherapy: Secondary | ICD-10-CM

## 2011-12-22 DIAGNOSIS — B20 Human immunodeficiency virus [HIV] disease: Secondary | ICD-10-CM

## 2011-12-22 DIAGNOSIS — C8121 Mixed cellularity classical Hodgkin lymphoma, lymph nodes of head, face, and neck: Secondary | ICD-10-CM

## 2011-12-22 DIAGNOSIS — G4700 Insomnia, unspecified: Secondary | ICD-10-CM

## 2011-12-22 DIAGNOSIS — F4310 Post-traumatic stress disorder, unspecified: Secondary | ICD-10-CM

## 2011-12-22 LAB — CBC WITH DIFFERENTIAL/PLATELET
BASO%: 0.8 % (ref 0.0–2.0)
HCT: 36.4 % — ABNORMAL LOW (ref 38.4–49.9)
MCHC: 34.3 g/dL (ref 32.0–36.0)
MONO#: 0.5 10*3/uL (ref 0.1–0.9)
RBC: 4.09 10*6/uL — ABNORMAL LOW (ref 4.20–5.82)
RDW: 14.3 % (ref 11.0–14.6)
WBC: 7.2 10*3/uL (ref 4.0–10.3)
lymph#: 1.7 10*3/uL (ref 0.9–3.3)

## 2011-12-22 LAB — COMPREHENSIVE METABOLIC PANEL
ALT: 41 U/L (ref 0–53)
CO2: 30 mEq/L (ref 19–32)
Calcium: 9.9 mg/dL (ref 8.4–10.5)
Chloride: 99 mEq/L (ref 96–112)
Potassium: 4 mEq/L (ref 3.5–5.3)
Sodium: 138 mEq/L (ref 135–145)
Total Protein: 6.7 g/dL (ref 6.0–8.3)

## 2011-12-22 MED ORDER — SODIUM CHLORIDE 0.9 % IJ SOLN
10.0000 mL | INTRAMUSCULAR | Status: DC | PRN
  Administered 2011-12-22: 10 mL
  Filled 2011-12-22: qty 10

## 2011-12-22 MED ORDER — ONDANSETRON 16 MG/50ML IVPB (CHCC)
16.0000 mg | Freq: Once | INTRAVENOUS | Status: AC
  Administered 2011-12-22: 16 mg via INTRAVENOUS

## 2011-12-22 MED ORDER — SODIUM CHLORIDE 0.9 % IV SOLN
Freq: Once | INTRAVENOUS | Status: AC
  Administered 2011-12-22: 09:00:00 via INTRAVENOUS

## 2011-12-22 MED ORDER — SODIUM CHLORIDE 0.9 % IV SOLN
375.0000 mg/m2 | Freq: Once | INTRAVENOUS | Status: AC
  Administered 2011-12-22: 1000 mg via INTRAVENOUS
  Filled 2011-12-22: qty 50

## 2011-12-22 MED ORDER — VINBLASTINE SULFATE CHEMO INJECTION 1 MG/ML
6.0000 mg/m2 | Freq: Once | INTRAVENOUS | Status: AC
  Administered 2011-12-22: 16 mg via INTRAVENOUS
  Filled 2011-12-22: qty 16

## 2011-12-22 MED ORDER — DEXAMETHASONE SODIUM PHOSPHATE 4 MG/ML IJ SOLN
20.0000 mg | Freq: Once | INTRAMUSCULAR | Status: AC
  Administered 2011-12-22: 20 mg via INTRAVENOUS

## 2011-12-22 MED ORDER — HEPARIN SOD (PORK) LOCK FLUSH 100 UNIT/ML IV SOLN
500.0000 [IU] | Freq: Once | INTRAVENOUS | Status: AC | PRN
  Administered 2011-12-22: 500 [IU]
  Filled 2011-12-22: qty 5

## 2011-12-22 MED ORDER — DOXORUBICIN HCL CHEMO IV INJECTION 2 MG/ML
25.0000 mg/m2 | Freq: Once | INTRAVENOUS | Status: AC
  Administered 2011-12-22: 66 mg via INTRAVENOUS
  Filled 2011-12-22: qty 33

## 2011-12-22 MED ORDER — SODIUM CHLORIDE 0.9 % IV SOLN
10.0000 [IU]/m2 | Freq: Once | INTRAVENOUS | Status: AC
  Administered 2011-12-22: 27 [IU] via INTRAVENOUS
  Filled 2011-12-22: qty 9

## 2011-12-22 NOTE — Telephone Encounter (Signed)
appts mae and printed for pt aom 

## 2011-12-22 NOTE — Progress Notes (Signed)
[facility]  Telephone:([phone] Fax:([phone]   OFFICE PROGRESS NOTE   DIAGNOSIS: stage II classical Hodgkins' lymphoma; mixed cellularity type, (at least 3 different areas of disease in bilateral neck).   PAST THERAPY: Biopsy only   CURRENT THERAPY: started on 11/24/2011 ABVD d1, 15 every 4 weeks with Neulasta the day after.  Goal is for 4 cycles total followed by consolidative radiation.   INTERVAL HISTORY: Jon Shannon 46 y.o. male returns for regular follow up.  He had slight nausea a few days after the last cycle of chemo which resolved with oral antiemetics.  He has had insomnia before starting chemo and needed to take sleeping aids.  He still works full time.  He has slight decreased in appetite but no significant weight loss.  He can hardly feel the cervical neck nodes anymore.   Patient denies fatigue, headache, visual changes, confusion, drenching night sweats, mucositis, odynophagia, dysphagia, nausea vomiting, jaundice, chest pain, palpitation, shortness of breath, dyspnea on exertion, productive cough, gum bleeding, epistaxis, hematemesis, hemoptysis, abdominal pain, abdominal swelling, early satiety, melena, hematochezia, hematuria, skin rash, spontaneous bleeding, joint swelling, joint pain, heat or cold intolerance, bowel bladder incontinence, back pain, focal motor weakness, paresthesia, depression, suicidal or homocidal ideation, feeling hopelessness.   Past Medical History  Diagnosis Date  . Hyperlipidemia 08/07/2011  . Hodgkin's lymphoma 11/09/2011  . HIV INFECTION 11/30/2009    no history of opportunistic infection  . HYPERTENSION 11/30/2009  . Hypertriglyceridemia   . PTSD (post-traumatic stress disorder)   . DJD (degenerative joint disease)   . Hepatitis A     resolved.     Past Surgical History  Procedure Date  . Tonsillectomy   . Left cervical node excisional biopsy 10/2011  . Left index finger trauma repair     Current Outpatient  Prescriptions  Medication Sig Dispense Refill  . bisoprolol-hydrochlorothiazide (ZIAC) 5-6.25 MG per tablet Take 1 tablet by mouth every morning.       . Cholecalciferol (VITAMIN D3) 1000 UNITS tablet Take 1,000 Units by mouth daily.        . diphenhydrAMINE (BENADRYL) 25 MG tablet Take 25 mg by mouth at bedtime as needed. For sleep      . emtricitabine-tenofovir (TRUVADA) 200-300 MG per tablet Take 1 tablet by mouth daily.      Marland Kitchen escitalopram (LEXAPRO) 10 MG tablet Take 10 mg by mouth daily.        Marland Kitchen GARLIC OIL PO Take 1 tablet by mouth daily.      Marland Kitchen gemfibrozil (LOPID) 600 MG tablet Take 600 mg by mouth 2 (two) times daily.      Marland Kitchen HYDROcodone-acetaminophen (NORCO) 5-325 MG per tablet Take 1 tablet by mouth every 6 (six) hours as needed. For pain      . lidocaine-prilocaine (EMLA) cream Apply to [facility] site one hour prior to needle sticks as needed to numb area.  30 g  2  . loratadine (CLARITIN) 10 MG tablet Take 10 mg by mouth daily.        Marland Kitchen LORazepam (ATIVAN) 0.5 MG tablet Take 1 tablet (0.5 mg total) by mouth every 6 (six) hours as needed (Anticipation Nausea or vomiting).  30 tablet  0  . metFORMIN (GLUCOPHAGE) 500 MG tablet Take by mouth Twice daily.      . ondansetron (ZOFRAN) 8 MG tablet Take 1 tab two times a day starting the day after chemo for 3 days. Then take 1 tab two times a day  as needed for nausea or vomiting.  30 tablet  1  . prochlorperazine (COMPAZINE) 10 MG tablet Take 1 tablet (10 mg total) by mouth every 6 (six) hours as needed (Nausea or vomiting).  30 tablet  1  . raltegravir (ISENTRESS) 400 MG tablet Take 400 mg by mouth 2 (two) times daily.      . vitamin C (ASCORBIC ACID) 500 MG tablet Take 1,000 mg by mouth daily.       No current facility-administered medications for this visit.   Facility-Administered Medications Ordered in Other Visits  Medication Dose Route Frequency Provider Last Rate Last Dose  . 0.9 %  sodium chloride infusion   Intravenous Once Exie Parody, MD 20 mL/hr at 12/22/11 0915    . bleomycin (BLEOCIN) 27 Units in sodium chloride 0.9 % 50 mL chemo infusion  10 Units/m2 (Treatment Plan Actual) Intravenous Once Exie Parody, MD      . dacarbazine (DTIC) 1,000 mg in sodium chloride 0.9 % 250 mL chemo infusion  375 mg/m2 (Treatment Plan Actual) Intravenous Once Exie Parody, MD      . dexamethasone (DECADRON) injection 20 mg  20 mg Intravenous Once Exie Parody, MD      . DOXOrubicin (ADRIAMYCIN) chemo injection 66 mg  25 mg/m2 (Treatment Plan Actual) Intravenous Once Exie Parody, MD      . heparin lock flush 100 unit/mL  500 Units Intracatheter Once PRN Exie Parody, MD      . ondansetron (ZOFRAN) IVPB 16 mg  16 mg Intravenous Once Exie Parody, MD      . sodium chloride 0.9 % injection 10 mL  10 mL Intracatheter PRN Exie Parody, MD      . vinBLAStine (VELBAN) chemo injection 16 mg  6 mg/m2 (Treatment Plan Actual) Intravenous Once Exie Parody, MD        ALLERGIES:  is allergic to shrimp.  REVIEW OF SYSTEMS:  The rest of the 14-point review of system was negative.   Filed Vitals:   12/22/11 0821  BP: 139/84  Pulse: 75  Temp: 97.2 F (36.2 C)   Wt Readings from Last 3 Encounters:  12/22/11 300 lb 9.6 oz (136.351 kg)  12/08/11 302 lb 12.8 oz (137.349 kg)  11/24/11 309 lb 4.8 oz (140.298 kg)   ECOG Performance status: 0  PHYSICAL EXAMINATION:   General: Obese male in no acute distress. Eyes: no scleral icterus. ENT: There were no oropharyngeal lesions. Neck was without thyromegaly. Lymphatics: Negative cervical, supraclavicular or axillary adenopathy. Left cervical neck node excisional scar has healed without any erythema, purulent discharge or pain on palpation. I could not appreciate any definite cervical adenopathy.  Respiratory: lungs were clear bilaterally without wheezing or crackles. Cardiovascular: Regular rate and rhythm, S1/S2, without murmur, rub or gallop. There was no pedal edema. GI: abdomen was soft, flat, nontender, nondistended, without  organomegaly. Muscoloskeletal: no spinal tenderness of palpation of vertebral spine. Skin exam was without echymosis, petichae. Neuro exam was nonfocal. Patient was able to get on and off exam table without assistance. Gait was normal. Patient was alerted and oriented. Attention was good. Language was appropriate. Mood was normal without depression. Speech was not pressured. Thought content was not tangential.      LABORATORY/RADIOLOGY DATA:  Lab Results  Component Value Date   WBC 7.2 12/22/2011   HGB 12.5* 12/22/2011   HCT 36.4* 12/22/2011   PLT 159 12/22/2011   GLUCOSE 148* 12/08/2011   CHOL  165 09/20/2011   TRIG 385* 09/20/2011   HDL 26* 09/20/2011   LDLCALC 62 09/20/2011   ALKPHOS 84 12/08/2011   ALT 40 12/08/2011   AST 60* 12/08/2011   NA 135 12/08/2011   K 3.9 12/08/2011   CL 97 12/08/2011   CREATININE 0.90 12/08/2011   BUN 13 12/08/2011   CO2 28 12/08/2011   INR 0.98 11/20/2011     ASSESSMENT AND PLAN:    1. HIV: He is compliant with Truvada and Isentress.   2. Hyperlipidemia: He is on gemfibrozil.   3. PTSD: He is on Lexapro with his psychiatrist. His mood while going through chemo has been stable.   4. Hypertension: Good control with Ziac.  5. Newly diagnosed left cervical neck node classical Hodgkins' lymphoma:  Stage II, nonbulky.  There was only slightly FDG uptake in the abdomen without definitive adenopathy.   - Treatment: He is tolerating ABVD well with mild fatigue, anorexia.  He does not have any dose-limiting toxicity.  I advised him to proceed with cycle #2, day1 today without dose reduction. - Restaging:  I went ahead and ordered a restaging PET scan to be done after cycle #2, day #15 of chemo.  6. Follow up: Return to clinic in 2 wks for cycle #2, dayd #15 of chemo.  7.  Insomnia:  He prefers to use melatonin/natural remedy for his insomnia.  I advised him to f/u with his PCP for other measures if melatonin does not work.  FULL CODE.

## 2011-12-23 ENCOUNTER — Ambulatory Visit (HOSPITAL_BASED_OUTPATIENT_CLINIC_OR_DEPARTMENT_OTHER): Payer: 59

## 2011-12-23 VITALS — BP 131/84 | HR 71 | Temp 98.3°F

## 2011-12-23 DIAGNOSIS — C8121 Mixed cellularity classical Hodgkin lymphoma, lymph nodes of head, face, and neck: Secondary | ICD-10-CM

## 2011-12-23 DIAGNOSIS — Z5189 Encounter for other specified aftercare: Secondary | ICD-10-CM

## 2011-12-23 DIAGNOSIS — C8190 Hodgkin lymphoma, unspecified, unspecified site: Secondary | ICD-10-CM

## 2011-12-23 MED ORDER — PEGFILGRASTIM INJECTION 6 MG/0.6ML
6.0000 mg | Freq: Once | SUBCUTANEOUS | Status: AC
  Administered 2011-12-23: 6 mg via SUBCUTANEOUS

## 2011-12-27 ENCOUNTER — Encounter: Payer: Self-pay | Admitting: Dietician

## 2011-12-27 NOTE — Progress Notes (Signed)
Brief Out-patient Oncology Nutrition Note  Reason: Positive Nutrition Risk screen for unintentional weight loss and decreased appetite.  [name] is a 46 year old male patient of Dr. Gaylyn Rong, diagnosed with hodgkin's lymphoma. Contacted [name] via home telephone number for nutrition risk. He reported that his appetite is fairly good. He has experienced some nausea. He reported to eat three meals daily. He reported that he desired weight loss.  Height: 6' Weight: 300 lb. BMI: 40.68 kg/m^2 (Extreme Obesity class III)  Wt Readings from Last 10 Encounters:  12/22/11 300 lb 9.6 oz (136.351 kg)  12/08/11 302 lb 12.8 oz (137.349 kg)  11/24/11 309 lb 4.8 oz (140.298 kg)  11/20/11 306 lb (138.801 kg)  11/16/11 310 lb (140.615 kg)  11/14/11 310 lb 3.2 oz (140.706 kg)  10/09/11 316 lb (143.337 kg)  08/07/11 312 lb 12.8 oz (141.885 kg)  05/02/11 306 lb (138.801 kg)  03/01/11 305 lb (138.347 kg)    I have encouraged the patient to continue to eat 3 healthy meals daily. We discussed healthy nutrition and appropriate weight loss goals. I have encouraged the patient not to loose too much weight during treatment. Patient asked good questions and is without any nutrition related questions or concerns at this time.   RD available for nutrition needs. No follow up needed.  Jon Shannon [facility] [phone]

## 2012-01-05 ENCOUNTER — Encounter: Payer: Self-pay | Admitting: Oncology

## 2012-01-05 ENCOUNTER — Other Ambulatory Visit (HOSPITAL_BASED_OUTPATIENT_CLINIC_OR_DEPARTMENT_OTHER): Payer: 59

## 2012-01-05 ENCOUNTER — Ambulatory Visit (HOSPITAL_BASED_OUTPATIENT_CLINIC_OR_DEPARTMENT_OTHER): Payer: 59 | Admitting: Oncology

## 2012-01-05 ENCOUNTER — Ambulatory Visit (HOSPITAL_BASED_OUTPATIENT_CLINIC_OR_DEPARTMENT_OTHER): Payer: 59

## 2012-01-05 VITALS — BP 135/90 | HR 76 | Temp 97.7°F | Ht 72.0 in | Wt 290.7 lb

## 2012-01-05 DIAGNOSIS — B20 Human immunodeficiency virus [HIV] disease: Secondary | ICD-10-CM

## 2012-01-05 DIAGNOSIS — G4700 Insomnia, unspecified: Secondary | ICD-10-CM

## 2012-01-05 DIAGNOSIS — C8190 Hodgkin lymphoma, unspecified, unspecified site: Secondary | ICD-10-CM

## 2012-01-05 DIAGNOSIS — Z5111 Encounter for antineoplastic chemotherapy: Secondary | ICD-10-CM

## 2012-01-05 DIAGNOSIS — I10 Essential (primary) hypertension: Secondary | ICD-10-CM

## 2012-01-05 DIAGNOSIS — C8121 Mixed cellularity classical Hodgkin lymphoma, lymph nodes of head, face, and neck: Secondary | ICD-10-CM

## 2012-01-05 LAB — CBC WITH DIFFERENTIAL/PLATELET
BASO%: 0.8 % (ref 0.0–2.0)
EOS%: 1 % (ref 0.0–7.0)
LYMPH%: 32.9 % (ref 14.0–49.0)
MCH: 30.1 pg (ref 27.2–33.4)
MCHC: 33.5 g/dL (ref 32.0–36.0)
MONO#: 0.5 10*3/uL (ref 0.1–0.9)
RBC: 4.25 10*6/uL (ref 4.20–5.82)
WBC: 5.9 10*3/uL (ref 4.0–10.3)
lymph#: 2 10*3/uL (ref 0.9–3.3)

## 2012-01-05 LAB — COMPREHENSIVE METABOLIC PANEL
ALT: 47 U/L (ref 0–53)
AST: 59 U/L — ABNORMAL HIGH (ref 0–37)
CO2: 30 mEq/L (ref 19–32)
Chloride: 99 mEq/L (ref 96–112)
Sodium: 137 mEq/L (ref 135–145)
Total Bilirubin: 0.4 mg/dL (ref 0.3–1.2)
Total Protein: 7.3 g/dL (ref 6.0–8.3)

## 2012-01-05 MED ORDER — SODIUM CHLORIDE 0.9 % IV SOLN
Freq: Once | INTRAVENOUS | Status: AC
  Administered 2012-01-05: 11:00:00 via INTRAVENOUS

## 2012-01-05 MED ORDER — SODIUM CHLORIDE 0.9 % IV SOLN
10.0000 [IU]/m2 | Freq: Once | INTRAVENOUS | Status: AC
  Administered 2012-01-05: 27 [IU] via INTRAVENOUS
  Filled 2012-01-05: qty 9

## 2012-01-05 MED ORDER — VINBLASTINE SULFATE CHEMO INJECTION 1 MG/ML
6.0000 mg/m2 | Freq: Once | INTRAVENOUS | Status: AC
  Administered 2012-01-05: 16 mg via INTRAVENOUS
  Filled 2012-01-05: qty 16

## 2012-01-05 MED ORDER — DOXORUBICIN HCL CHEMO IV INJECTION 2 MG/ML
25.0000 mg/m2 | Freq: Once | INTRAVENOUS | Status: AC
  Administered 2012-01-05: 66 mg via INTRAVENOUS
  Filled 2012-01-05: qty 33

## 2012-01-05 MED ORDER — SODIUM CHLORIDE 0.9 % IV SOLN
375.0000 mg/m2 | Freq: Once | INTRAVENOUS | Status: AC
  Administered 2012-01-05: 1000 mg via INTRAVENOUS
  Filled 2012-01-05: qty 50

## 2012-01-05 MED ORDER — DEXAMETHASONE SODIUM PHOSPHATE 4 MG/ML IJ SOLN
20.0000 mg | Freq: Once | INTRAMUSCULAR | Status: AC
  Administered 2012-01-05: 20 mg via INTRAVENOUS

## 2012-01-05 MED ORDER — SODIUM CHLORIDE 0.9 % IJ SOLN
10.0000 mL | INTRAMUSCULAR | Status: DC | PRN
  Administered 2012-01-05: 10 mL
  Filled 2012-01-05: qty 10

## 2012-01-05 MED ORDER — HEPARIN SOD (PORK) LOCK FLUSH 100 UNIT/ML IV SOLN
500.0000 [IU] | Freq: Once | INTRAVENOUS | Status: AC | PRN
  Administered 2012-01-05: 500 [IU]
  Filled 2012-01-05: qty 5

## 2012-01-05 MED ORDER — ONDANSETRON 16 MG/50ML IVPB (CHCC)
16.0000 mg | Freq: Once | INTRAVENOUS | Status: AC
  Administered 2012-01-05: 16 mg via INTRAVENOUS

## 2012-01-05 NOTE — Progress Notes (Signed)
Jon Shannon  [phone] [phone]   OFFICE PROGRESS NOTE   DIAGNOSIS: stage II classical Hodgkins' lymphoma; mixed cellularity type, (at least 3 different areas of disease in bilateral neck).   PAST THERAPY: Biopsy only   CURRENT THERAPY: started on 11/24/2011 ABVD d1, 15 every 4 weeks with Neulasta the day after.  Goal is for 4 cycles total followed by consolidative radiation.   INTERVAL HISTORY: Jon Shannon 46 y.o. male returns for regular follow up.  He had slight nausea a few days after the last cycle of chemo which resolved with oral antiemetics.  He has had insomnia before starting chemo and needed to take sleeping aids.  He still works full time.  Appetite better, but he has lost 10 lbs since his last visit 10 days ago. States that he has been traveling and not eating much during the day.  He can hardly feel the cervical neck nodes anymore.   Patient denies fatigue, headache, visual changes, confusion, drenching night sweats, mucositis, odynophagia, dysphagia, nausea vomiting, jaundice, chest pain, palpitation, shortness of breath, dyspnea on exertion, productive cough, gum bleeding, epistaxis, hematemesis, hemoptysis, abdominal pain, abdominal swelling, early satiety, melena, hematochezia, hematuria, skin rash, spontaneous bleeding, joint swelling, joint pain, heat or cold intolerance, bowel bladder incontinence, back pain, focal motor weakness, paresthesia, depression, suicidal or homocidal ideation, feeling hopelessness.   Past Medical History  Diagnosis Date  . Hyperlipidemia 08/07/2011  . Hodgkin's lymphoma 11/09/2011  . HIV INFECTION 11/30/2009    no history of opportunistic infection  . HYPERTENSION 11/30/2009  . Hypertriglyceridemia   . PTSD (post-traumatic stress disorder)   . DJD (degenerative joint disease)   . Hepatitis A     resolved.     Past Surgical History  Procedure Date  . Tonsillectomy   . Left cervical node excisional  biopsy 10/2011  . Left index finger trauma repair     Current Outpatient Prescriptions  Medication Sig Dispense Refill  . bisoprolol-hydrochlorothiazide (ZIAC) 5-6.25 MG per tablet Take 1 tablet by mouth every morning.       . Cholecalciferol (VITAMIN D3) 1000 UNITS tablet Take 1,000 Units by mouth daily.        . diphenhydrAMINE (BENADRYL) 25 MG tablet Take 25 mg by mouth at bedtime as needed. For sleep      . emtricitabine-tenofovir (TRUVADA) 200-300 MG per tablet Take 1 tablet by mouth daily.      Marland Kitchen escitalopram (LEXAPRO) 10 MG tablet Take 10 mg by mouth daily.        Marland Kitchen GARLIC OIL PO Take 1 tablet by mouth daily.      Marland Kitchen gemfibrozil (LOPID) 600 MG tablet Take 600 mg by mouth 2 (two) times daily.      Marland Kitchen HYDROcodone-acetaminophen (NORCO) 5-325 MG per tablet Take 1 tablet by mouth every 6 (six) hours as needed. For pain      . lidocaine-prilocaine (EMLA) cream Apply to Jon Shannon site one hour prior to needle sticks as needed to numb area.  30 g  2  . loratadine (CLARITIN) 10 MG tablet Take 10 mg by mouth daily.        Marland Kitchen LORazepam (ATIVAN) 0.5 MG tablet Take 1 tablet (0.5 mg total) by mouth every 6 (six) hours as needed (Anticipation Nausea or vomiting).  30 tablet  0  . metFORMIN (GLUCOPHAGE) 500 MG tablet Take by mouth Twice daily.      . ondansetron (ZOFRAN) 8 MG tablet Take 1 tab two times a  day starting the day after chemo for 3 days. Then take 1 tab two times a day as needed for nausea or vomiting.  30 tablet  1  . prochlorperazine (COMPAZINE) 10 MG tablet Take 1 tablet (10 mg total) by mouth every 6 (six) hours as needed (Nausea or vomiting).  30 tablet  1  . raltegravir (ISENTRESS) 400 MG tablet Take 400 mg by mouth 2 (two) times daily.      . vitamin C (ASCORBIC ACID) 500 MG tablet Take 1,000 mg by mouth daily.       No current facility-administered medications for this visit.   Facility-Administered Medications Ordered in Other Visits  Medication Dose Route Frequency Provider Last  Rate Last Dose  . 0.9 %  sodium chloride infusion   Intravenous Once Exie Parody, MD 20 mL/hr at 01/05/12 1038    . bleomycin (BLEOCIN) 27 Units in sodium chloride 0.9 % 50 mL chemo infusion  10 Units/m2 (Treatment Plan Actual) Intravenous Once Exie Parody, MD      . dacarbazine (DTIC) 1,000 mg in sodium chloride 0.9 % 250 mL chemo infusion  375 mg/m2 (Treatment Plan Actual) Intravenous Once Exie Parody, MD      . dexamethasone (DECADRON) injection 20 mg  20 mg Intravenous Once Exie Parody, MD   20 mg at 01/05/12 1037  . DOXOrubicin (ADRIAMYCIN) chemo injection 66 mg  25 mg/m2 (Treatment Plan Actual) Intravenous Once Exie Parody, MD      . heparin lock flush 100 unit/mL  500 Units Intracatheter Once PRN Exie Parody, MD      . ondansetron (ZOFRAN) IVPB 16 mg  16 mg Intravenous Once Exie Parody, MD   16 mg at 01/05/12 1037  . sodium chloride 0.9 % injection 10 mL  10 mL Intracatheter PRN Exie Parody, MD      . vinBLAStine (VELBAN) chemo injection 16 mg  6 mg/m2 (Treatment Plan Actual) Intravenous Once Exie Parody, MD        ALLERGIES:  is allergic to shrimp.  REVIEW OF SYSTEMS:  The rest of the 14-point review of system was negative.   Filed Vitals:   01/05/12 0929  BP: 135/90  Pulse: 76  Temp: 97.7 F (36.5 C)   Wt Readings from Last 3 Encounters:  01/05/12 290 lb 11.2 oz (131.861 kg)  12/22/11 300 lb 9.6 oz (136.351 kg)  12/08/11 302 lb 12.8 oz (137.349 kg)   ECOG Performance status: 0  PHYSICAL EXAMINATION:   General: Obese male in no acute distress. Eyes: no scleral icterus. ENT: There were no oropharyngeal lesions. Neck was without thyromegaly. Lymphatics: Negative cervical, supraclavicular or axillary adenopathy. Left cervical neck node excisional scar has healed without any erythema, purulent discharge or pain on palpation. I could not appreciate any definite cervical adenopathy.  Respiratory: lungs were clear bilaterally without wheezing or crackles. Cardiovascular: Regular rate and rhythm,  S1/S2, without murmur, rub or gallop. There was no pedal edema. GI: abdomen was soft, flat, nontender, nondistended, without organomegaly. Muscoloskeletal: no spinal tenderness of palpation of vertebral spine. Skin exam was without echymosis, petichae. Neuro exam was nonfocal. Patient was able to get on and off exam table without assistance. Gait was normal. Patient was alerted and oriented. Attention was good. Language was appropriate. Mood was normal without depression. Speech was not pressured. Thought content was not tangential.   LABORATORY/RADIOLOGY DATA:  Lab Results  Component Value Date   WBC 5.9 01/05/2012   HGB  12.8* 01/05/2012   HCT 38.2* 01/05/2012   PLT 266 01/05/2012   GLUCOSE 225* 12/22/2011   CHOL 165 09/20/2011   TRIG 385* 09/20/2011   HDL 26* 09/20/2011   LDLCALC 62 09/20/2011   ALKPHOS 87 12/22/2011   ALT 41 12/22/2011   AST 42* 12/22/2011   NA 138 12/22/2011   K 4.0 12/22/2011   CL 99 12/22/2011   CREATININE 0.92 12/22/2011   BUN 14 12/22/2011   CO2 30 12/22/2011   INR 0.98 11/20/2011   ASSESSMENT AND PLAN:   1. HIV: He is compliant with Truvada and Isentress.   2. Hyperlipidemia: He is on gemfibrozil.   3. PTSD: He is on Lexapro with his psychiatrist. His mood while going through chemo has been stable.   4. Hypertension: Good control with Ziac.  5. Newly diagnosed left cervical neck node classical Hodgkins' lymphoma:  Stage II, nonbulky.  There was only slightly FDG uptake in the abdomen without definitive adenopathy.   - Treatment: He is tolerating ABVD well with mild fatigue, anorexia.  He does not have any dose-limiting toxicity.  I advised him to proceed with cycle #2, day 15 today without dose reduction. - Restaging:  He has a restaging PET scan prior to beginning cycle 3 of ABVD.Marland Kitchen  6. Insomnia: Continue OTC medications. Follow-up with PCP if not effective.  7. Follow up: Return to clinic in 2 wks for cycle #3 day 1.   FULL CODE.

## 2012-01-05 NOTE — Patient Instructions (Signed)
[facility] Discharge Instructions for Patients Receiving Chemotherapy  Today you received the following chemotherapy agents Adriamycin, Velban, Bleomycin, DTIC.   To help prevent nausea and vomiting after your treatment, we encourage you to take your nausea medication. Begin taking it at 7pm and take it as often as prescribed for the next 24-72 hours.   If you develop nausea and vomiting that is not controlled by your nausea medication, call the clinic. If it is after clinic hours your family physician or the after hours number for the clinic or go to the Emergency Department.   BELOW ARE SYMPTOMS THAT SHOULD BE REPORTED IMMEDIATELY:  *FEVER GREATER THAN 100.5 F  *CHILLS WITH OR WITHOUT FEVER  NAUSEA AND VOMITING THAT IS NOT CONTROLLED WITH YOUR NAUSEA MEDICATION  *UNUSUAL SHORTNESS OF BREATH  *UNUSUAL BRUISING OR BLEEDING  TENDERNESS IN MOUTH AND THROAT WITH OR WITHOUT PRESENCE OF ULCERS  *URINARY PROBLEMS  *BOWEL PROBLEMS  UNUSUAL RASH Items with * indicate a potential emergency and should be followed up as soon as possible.  One of the nurses will contact you 24 hours after your treatment. Please let the nurse know about any problems that you may have experienced. Feel free to call the clinic you have any questions or concerns. The clinic phone number is [phone].   I have been informed and understand all the instructions given to me. I know to contact the clinic, my physician, or go to the Emergency Department if any problems should occur. I do not have any questions at this time, but understand that I may call the clinic during office hours   should I have any questions or need assistance in obtaining follow up care.    __________________________________________  _____________  __________ Signature of Patient or Authorized Representative            Date                   Time    __________________________________________ Nurse's Signature

## 2012-01-06 ENCOUNTER — Ambulatory Visit (HOSPITAL_BASED_OUTPATIENT_CLINIC_OR_DEPARTMENT_OTHER): Payer: 59

## 2012-01-06 VITALS — BP 128/81 | HR 75 | Temp 97.9°F

## 2012-01-06 DIAGNOSIS — C8190 Hodgkin lymphoma, unspecified, unspecified site: Secondary | ICD-10-CM

## 2012-01-06 DIAGNOSIS — Z5189 Encounter for other specified aftercare: Secondary | ICD-10-CM

## 2012-01-06 MED ORDER — PEGFILGRASTIM INJECTION 6 MG/0.6ML
6.0000 mg | Freq: Once | SUBCUTANEOUS | Status: AC
  Administered 2012-01-06: 6 mg via SUBCUTANEOUS

## 2012-01-16 ENCOUNTER — Encounter (HOSPITAL_COMMUNITY): Payer: Self-pay

## 2012-01-16 ENCOUNTER — Encounter (HOSPITAL_COMMUNITY)
Admission: RE | Admit: 2012-01-16 | Discharge: 2012-01-16 | Disposition: A | Discharge status: Home / Self Care | Payer: 59 | Source: Ambulatory Visit | Attending: Oncology | Admitting: Oncology

## 2012-01-16 DIAGNOSIS — K4090 Unilateral inguinal hernia, without obstruction or gangrene, not specified as recurrent: Secondary | ICD-10-CM | POA: Insufficient documentation

## 2012-01-16 DIAGNOSIS — R161 Splenomegaly, not elsewhere classified: Secondary | ICD-10-CM | POA: Insufficient documentation

## 2012-01-16 DIAGNOSIS — C8190 Hodgkin lymphoma, unspecified, unspecified site: Secondary | ICD-10-CM

## 2012-01-16 DIAGNOSIS — C8589 Other specified types of non-Hodgkin lymphoma, extranodal and solid organ sites: Secondary | ICD-10-CM | POA: Insufficient documentation

## 2012-01-16 DIAGNOSIS — K7689 Other specified diseases of liver: Secondary | ICD-10-CM | POA: Insufficient documentation

## 2012-01-16 MED ORDER — FLUDEOXYGLUCOSE F - 18 (FDG) INJECTION
18.2000 | Freq: Once | INTRAVENOUS | Status: AC | PRN
  Administered 2012-01-16: 18.2 via INTRAVENOUS

## 2012-01-16 NOTE — Patient Instructions (Addendum)
A.  Result of PET scan:    Neck: No hypermetabolic lymph nodes in the neck.  Chest: No hypermetabolic mediastinal or hilar nodes. Small  scattered stable pulmonary nodules. No new lesions.  Abdomen/Pelvis: No abnormal hypermetabolic activity within the  liver, pancreas, adrenal glands, or spleen. No hypermetabolic  lymph nodes in the abdomen or pelvis. Scattered retroperitoneal  lymph nodes are unchanged.  Skelton: No focal hypermetabolic activity to suggest skeletal  metastasis. Diffuse marrow activity likely due to chemotherapy and  or marrow stimulating drugs.  Additional CT findings: Diffuse fatty infiltration of the liver is  again demonstrated with areas of fatty sparing. Splenomegaly is  again demonstrated. Small bilateral inguinal hernias are noted  with fat only. Mild diffuse bladder wall thickening without  prostate gland enlargement.   IMPRESSION:   Negative PET CT for metabolically active lymphoma in the neck,  chest, abdomen or pelvis.  B.  Plan:  2 more cycles of chemo (4 infusions total) and then radiation to the neck.

## 2012-01-17 ENCOUNTER — Telehealth: Payer: Self-pay | Admitting: Oncology

## 2012-01-17 ENCOUNTER — Other Ambulatory Visit (HOSPITAL_BASED_OUTPATIENT_CLINIC_OR_DEPARTMENT_OTHER): Payer: 59 | Admitting: Lab

## 2012-01-17 ENCOUNTER — Ambulatory Visit (HOSPITAL_BASED_OUTPATIENT_CLINIC_OR_DEPARTMENT_OTHER): Payer: 59 | Admitting: Oncology

## 2012-01-17 VITALS — BP 141/81 | HR 77 | Temp 97.6°F | Ht 72.0 in | Wt 288.3 lb

## 2012-01-17 DIAGNOSIS — R110 Nausea: Secondary | ICD-10-CM

## 2012-01-17 DIAGNOSIS — C8190 Hodgkin lymphoma, unspecified, unspecified site: Secondary | ICD-10-CM

## 2012-01-17 DIAGNOSIS — R059 Cough, unspecified: Secondary | ICD-10-CM

## 2012-01-17 DIAGNOSIS — R05 Cough: Secondary | ICD-10-CM

## 2012-01-17 DIAGNOSIS — D649 Anemia, unspecified: Secondary | ICD-10-CM

## 2012-01-17 DIAGNOSIS — B20 Human immunodeficiency virus [HIV] disease: Secondary | ICD-10-CM

## 2012-01-17 DIAGNOSIS — C8121 Mixed cellularity classical Hodgkin lymphoma, lymph nodes of head, face, and neck: Secondary | ICD-10-CM

## 2012-01-17 LAB — CBC WITH DIFFERENTIAL/PLATELET
Basophils Absolute: 0 10*3/uL (ref 0.0–0.1)
Eosinophils Absolute: 0.1 10*3/uL (ref 0.0–0.5)
HCT: 38.5 % (ref 38.4–49.9)
HGB: 12.7 g/dL — ABNORMAL LOW (ref 13.0–17.1)
LYMPH%: 23.4 % (ref 14.0–49.0)
MONO#: 0.6 10*3/uL (ref 0.1–0.9)
NEUT#: 6.1 10*3/uL (ref 1.5–6.5)
NEUT%: 68 % (ref 39.0–75.0)
Platelets: 239 10*3/uL (ref 140–400)
RBC: 4.21 10*6/uL (ref 4.20–5.82)
WBC: 9 10*3/uL (ref 4.0–10.3)

## 2012-01-17 LAB — COMPREHENSIVE METABOLIC PANEL
BUN: 11 mg/dL (ref 6–23)
CO2: 28 mEq/L (ref 19–32)
Glucose, Bld: 95 mg/dL (ref 70–99)
Sodium: 140 mEq/L (ref 135–145)
Total Bilirubin: 0.4 mg/dL (ref 0.3–1.2)
Total Protein: 7.4 g/dL (ref 6.0–8.3)

## 2012-01-17 NOTE — Telephone Encounter (Signed)
Talked to pt, gave her appt for Pulmnonary FX test on 01/23/12 @ [facility] 1245pm

## 2012-01-17 NOTE — Telephone Encounter (Signed)
Called pt and left message for Pulmonary function test , gave pt appt for July 2013 lab , chemo and ML

## 2012-01-17 NOTE — Progress Notes (Signed)
[facility]  Telephone:([phone] Fax:([phone]   OFFICE PROGRESS NOTE   Cc:  No primary provider on file.  DIAGNOSIS: stage II classical Hodgkins' lymphoma; mixed cellularity type, (at least 3 different areas of disease in bilateral neck).   PAST THERAPY: Biopsy only   CURRENT THERAPY: started on 11/24/2011 ABVD d1, 15 every 4 weeks with Neulasta the day after. Goal is for 4 cycles total followed by consolidative radiation  INTERVAL HISTORY: Jon Shannon 46 y.o. male returns for regular follow up with his sister.  He reported very mild neuropathy in the fingers and toes.  He has no problem typing, texting, holding utensils.  He does have nasal drainage and has had come dry cough the last week.  He denied SOB, chest pain, DOE.  He no longer feels any adenopathy.  He has mild nausea for a few days after chemo but no vomiting. He has mild fatigue from chemo; however, he is still working full time.   Patient denies fever, anorexia, weight loss, fatigue, headache, visual changes, confusion, drenching night sweats,  mucositis, odynophagia, dysphagia, jaundice, chest pain, palpitation, gum bleeding, epistaxis, hematemesis, hemoptysis, abdominal pain, abdominal swelling, early satiety, melena, hematochezia, hematuria, skin rash, spontaneous bleeding, joint swelling, joint pain, heat or cold intolerance, bowel bladder incontinence, back pain, focal motor weakness, depression.    Past Medical History  Diagnosis Date  . Hyperlipidemia 08/07/2011  . Hodgkin's lymphoma 11/09/2011  . HIV INFECTION 11/30/2009    no history of opportunistic infection  . HYPERTENSION 11/30/2009  . Hypertriglyceridemia   . PTSD (post-traumatic stress disorder)   . DJD (degenerative joint disease)   . Hepatitis A     resolved.     Past Surgical History  Procedure Date  . Tonsillectomy   . Left cervical node excisional biopsy 10/2011  . Left index finger trauma repair     Current Outpatient  Prescriptions  Medication Sig Dispense Refill  . bisoprolol-hydrochlorothiazide (ZIAC) 5-6.25 MG per tablet Take 1 tablet by mouth every morning.       . Cholecalciferol (VITAMIN D3) 1000 UNITS tablet Take 1,000 Units by mouth daily.        . diphenhydrAMINE (BENADRYL) 25 MG tablet Take 25 mg by mouth at bedtime as needed. For sleep      . emtricitabine-tenofovir (TRUVADA) 200-300 MG per tablet Take 1 tablet by mouth daily.      Marland Kitchen escitalopram (LEXAPRO) 10 MG tablet Take 10 mg by mouth daily.        Marland Kitchen GARLIC OIL PO Take 1 tablet by mouth daily.      Marland Kitchen gemfibrozil (LOPID) 600 MG tablet Take 600 mg by mouth 2 (two) times daily.      Marland Kitchen HYDROcodone-acetaminophen (NORCO) 5-325 MG per tablet Take 1 tablet by mouth every 6 (six) hours as needed. For pain      . lidocaine-prilocaine (EMLA) cream Apply to [facility] site one hour prior to needle sticks as needed to numb area.  30 g  2  . loratadine (CLARITIN) 10 MG tablet Take 10 mg by mouth daily.        Marland Kitchen LORazepam (ATIVAN) 0.5 MG tablet Take 1 tablet (0.5 mg total) by mouth every 6 (six) hours as needed (Anticipation Nausea or vomiting).  30 tablet  0  . metFORMIN (GLUCOPHAGE) 500 MG tablet Take by mouth Twice daily.      . ondansetron (ZOFRAN) 8 MG tablet Take 1 tab two times a day starting the day  after chemo for 3 days. Then take 1 tab two times a day as needed for nausea or vomiting.  30 tablet  1  . prochlorperazine (COMPAZINE) 10 MG tablet Take 1 tablet (10 mg total) by mouth every 6 (six) hours as needed (Nausea or vomiting).  30 tablet  1  . raltegravir (ISENTRESS) 400 MG tablet Take 400 mg by mouth 2 (two) times daily.      . vitamin C (ASCORBIC ACID) 500 MG tablet Take 1,000 mg by mouth daily.        ALLERGIES:  is allergic to shrimp.  REVIEW OF SYSTEMS:  The rest of the 14-point review of system was negative.   Filed Vitals:   01/17/12 1501  BP: 141/81  Pulse: 77  Temp: 97.6 F (36.4 C)   Wt Readings from Last 3 Encounters:    01/17/12 288 lb 4.8 oz (130.772 kg)  01/05/12 290 lb 11.2 oz (131.861 kg)  12/22/11 300 lb 9.6 oz (136.351 kg)   ECOG Performance status:  1  PHYSICAL EXAMINATION:   General: Obese male in no acute distress. Eyes: no scleral icterus. ENT: There were no oropharyngeal lesions. Neck was without thyromegaly. Lymphatics: Negative cervical, supraclavicular or axillary adenopathy. Left cervical neck node excisional scar has healed without any erythema, purulent discharge or pain on palpation. I could not appreciate any definite cervical adenopathy. Respiratory: lungs were clear bilaterally without wheezing or crackles. Cardiovascular: Regular rate and rhythm, S1/S2, without murmur, rub or gallop. There was no pedal edema. GI: abdomen was soft, flat, nontender, nondistended, without organomegaly. Muscoloskeletal: no spinal tenderness of palpation of vertebral spine. Skin exam was without echymosis, petichae. Neuro exam was nonfocal. Patient was able to get on and off exam table without assistance. Gait was normal. Patient was alerted and oriented. Attention was good. Language was appropriate. Mood was normal without depression. Speech was not pressured. Thought content was not tangential.    LABORATORY/RADIOLOGY DATA:  Lab Results  Component Value Date   WBC 9.0 01/17/2012   HGB 12.7* 01/17/2012   HCT 38.5 01/17/2012   PLT 239 01/17/2012   GLUCOSE 95 01/17/2012   CHOL 165 09/20/2011   TRIG 385* 09/20/2011   HDL 26* 09/20/2011   LDLCALC 62 09/20/2011   ALKPHOS 89 01/17/2012   ALT 50 01/17/2012   AST 62* 01/17/2012   NA 140 01/17/2012   K 4.1 01/17/2012   CL 99 01/17/2012   CREATININE 0.92 01/17/2012   BUN 11 01/17/2012   CO2 28 01/17/2012   INR 0.98 11/20/2011   IMAGING:  I personally reviewed the following PET scan and showed the patient and his sister the images.  In brief, there was complete radiographic response after 2 cycles of chemo.     Nm Pet Image Restag (ps) Skull Base To Thigh  01/16/2012   *RADIOLOGY REPORT*  Clinical Data: Subsequent treatment strategy for lymphoma.  NUCLEAR MEDICINE PET SKULL BASE TO THIGH  Fasting Blood Glucose:  160  Technique:  18.2 mCi F-18 FDG was injected intravenously. CT data was obtained and used for attenuation correction and anatomic localization only.  (This was not acquired as a diagnostic CT examination.) Additional exam technical data entered on technologist worksheet.  Comparison:  11/24/2011.  Findings:  Neck: No hypermetabolic lymph nodes in the neck.  Chest:  No hypermetabolic mediastinal or hilar nodes.  Small scattered stable pulmonary nodules.  No new lesions.  Abdomen/Pelvis:  No abnormal hypermetabolic activity within the liver, pancreas, adrenal glands, or spleen.  No hypermetabolic lymph nodes in the abdomen or pelvis.  Scattered retroperitoneal lymph nodes are unchanged.  Skelton:  No focal hypermetabolic activity to suggest skeletal metastasis. Diffuse marrow activity likely due to chemotherapy and or marrow stimulating drugs.  Additional CT findings:  Diffuse fatty infiltration of the liver is again demonstrated with areas of fatty sparing.  Splenomegaly is again demonstrated.  Small bilateral inguinal hernias are noted with fat only.  Mild diffuse bladder wall thickening without prostate gland enlargement.  IMPRESSION: Negative PET CT for metabolically active lymphoma in the neck, chest, abdomen or pelvis.  Original Report Authenticated By: P. Loralie Champagne, M.D.     ASSESSMENT AND PLAN:   1. HIV: He reports that he is compliant with Truvada and Isentress.   2. Hyperlipidemia: He is on gemfibrozil.   3. PTSD: He is on Lexapro with his psychiatrist. His mood while going through chemo has been stable.   4. Hypertension: Good control with Ziac.   5. Newly diagnosed left cervical neck node classical Hodgkins' lymphoma: Stage II, low risk, nonbulky. There was only slightly FDG uptake in the abdomen without definitive adenopathy.  - He is s/p  2 cycles of ABVD with complete radiographic response.  He has grade 1 neuropathy, grade 1 anemia, grade 1 nausea.  He does not have dose limiting toxicity to warrant dose modification of chemo.  I recommended to patient to proceed with 2 more cycles of chemo for 4 cycles total.  We will repeat PET scan after th3 4th cycle.  If he again has continuing response, I will refer him to Rad Onc for evaluation of consolidative radiation.   6.  Cough:  Most likely due to nasal drainage/allergy.  I advised him on OTC nasal spray and Nasonex prn if symptoms persists or worsens.  To rule out bleomycin-lung toxicity, I requested a repeat PFT within 2 wks.

## 2012-01-19 ENCOUNTER — Telehealth: Payer: Self-pay | Admitting: Oncology

## 2012-01-19 ENCOUNTER — Ambulatory Visit (HOSPITAL_BASED_OUTPATIENT_CLINIC_OR_DEPARTMENT_OTHER): Payer: 59

## 2012-01-19 VITALS — BP 134/80 | HR 73 | Temp 97.8°F

## 2012-01-19 DIAGNOSIS — Z5111 Encounter for antineoplastic chemotherapy: Secondary | ICD-10-CM

## 2012-01-19 DIAGNOSIS — C8190 Hodgkin lymphoma, unspecified, unspecified site: Secondary | ICD-10-CM

## 2012-01-19 DIAGNOSIS — C8191 Hodgkin lymphoma, unspecified, lymph nodes of head, face, and neck: Secondary | ICD-10-CM

## 2012-01-19 MED ORDER — SODIUM CHLORIDE 0.9 % IV SOLN
375.0000 mg/m2 | Freq: Once | INTRAVENOUS | Status: AC
  Administered 2012-01-19: 1000 mg via INTRAVENOUS
  Filled 2012-01-19: qty 50

## 2012-01-19 MED ORDER — DEXAMETHASONE SODIUM PHOSPHATE 4 MG/ML IJ SOLN
20.0000 mg | Freq: Once | INTRAMUSCULAR | Status: AC
  Administered 2012-01-19: 20 mg via INTRAVENOUS

## 2012-01-19 MED ORDER — SODIUM CHLORIDE 0.9 % IV SOLN
10.0000 [IU]/m2 | Freq: Once | INTRAVENOUS | Status: AC
  Administered 2012-01-19: 27 [IU] via INTRAVENOUS
  Filled 2012-01-19: qty 9

## 2012-01-19 MED ORDER — VINBLASTINE SULFATE CHEMO INJECTION 1 MG/ML
6.0000 mg/m2 | Freq: Once | INTRAVENOUS | Status: AC
  Administered 2012-01-19: 16 mg via INTRAVENOUS
  Filled 2012-01-19: qty 16

## 2012-01-19 MED ORDER — ONDANSETRON 16 MG/50ML IVPB (CHCC)
16.0000 mg | Freq: Once | INTRAVENOUS | Status: AC
  Administered 2012-01-19: 16 mg via INTRAVENOUS

## 2012-01-19 MED ORDER — DOXORUBICIN HCL CHEMO IV INJECTION 2 MG/ML
25.0000 mg/m2 | Freq: Once | INTRAVENOUS | Status: AC
  Administered 2012-01-19: 66 mg via INTRAVENOUS
  Filled 2012-01-19: qty 33

## 2012-01-19 NOTE — Telephone Encounter (Signed)
Talked to pt and gave him appt for June and July 2013 , he is also aware of appt for the Pulmonary function test

## 2012-01-19 NOTE — Patient Instructions (Addendum)
[facility] Discharge Instructions for Patients Receiving Chemotherapy  Today you received the following chemotherapy agents Adriamycin, Bleomycin, Velban, DTIC.   If you develop nausea and vomiting that is not controlled by your nausea medication, call the clinic.  [phone]  BELOW ARE SYMPTOMS THAT SHOULD BE REPORTED IMMEDIATELY:  *FEVER GREATER THAN 100.5 F  *CHILLS WITH OR WITHOUT FEVER  NAUSEA AND VOMITING THAT IS NOT CONTROLLED WITH YOUR NAUSEA MEDICATION  *UNUSUAL SHORTNESS OF BREATH  *UNUSUAL BRUISING OR BLEEDING  TENDERNESS IN MOUTH AND THROAT WITH OR WITHOUT PRESENCE OF ULCERS  *URINARY PROBLEMS  *BOWEL PROBLEMS  UNUSUAL RASH Items with * indicate a potential emergency and should be followed up as soon as possible.  Feel free to call the clinic you have any questions or concerns. The clinic phone number is [phone].

## 2012-01-20 ENCOUNTER — Ambulatory Visit (HOSPITAL_BASED_OUTPATIENT_CLINIC_OR_DEPARTMENT_OTHER): Payer: 59

## 2012-01-20 VITALS — BP 123/82 | HR 70 | Temp 97.8°F

## 2012-01-20 DIAGNOSIS — C8190 Hodgkin lymphoma, unspecified, unspecified site: Secondary | ICD-10-CM

## 2012-01-20 DIAGNOSIS — Z5189 Encounter for other specified aftercare: Secondary | ICD-10-CM

## 2012-01-20 MED ORDER — PEGFILGRASTIM INJECTION 6 MG/0.6ML
6.0000 mg | Freq: Once | SUBCUTANEOUS | Status: AC
  Administered 2012-01-20: 6 mg via SUBCUTANEOUS

## 2012-01-22 ENCOUNTER — Telehealth: Payer: Self-pay | Admitting: *Deleted

## 2012-01-22 ENCOUNTER — Other Ambulatory Visit: Payer: 59

## 2012-01-22 DIAGNOSIS — B20 Human immunodeficiency virus [HIV] disease: Secondary | ICD-10-CM

## 2012-01-22 DIAGNOSIS — Z113 Encounter for screening for infections with a predominantly sexual mode of transmission: Secondary | ICD-10-CM

## 2012-01-22 DIAGNOSIS — E785 Hyperlipidemia, unspecified: Secondary | ICD-10-CM

## 2012-01-22 LAB — CBC
MCV: 88 fL (ref 78.0–100.0)
Platelets: 172 10*3/uL (ref 150–400)
RBC: 3.92 MIL/uL — ABNORMAL LOW (ref 4.22–5.81)
WBC: 34 10*3/uL — ABNORMAL HIGH (ref 4.0–10.5)

## 2012-01-22 LAB — COMPREHENSIVE METABOLIC PANEL
ALT: 38 U/L (ref 0–53)
CO2: 30 mEq/L (ref 19–32)
Calcium: 9.8 mg/dL (ref 8.4–10.5)
Chloride: 98 mEq/L (ref 96–112)
Creat: 1.07 mg/dL (ref 0.50–1.35)
Sodium: 138 mEq/L (ref 135–145)
Total Protein: 7 g/dL (ref 6.0–8.3)

## 2012-01-22 LAB — LIPID PANEL

## 2012-01-22 LAB — RPR

## 2012-01-22 NOTE — Telephone Encounter (Signed)
Notified by patient that Truvada will need a prior authorization by 01/29/12.  Tried to initiate that by phone and their computer system is down.  Will try to do this online at [email or web address]. Wendall Mola CMA

## 2012-01-23 ENCOUNTER — Ambulatory Visit (HOSPITAL_COMMUNITY)
Admission: RE | Admit: 2012-01-23 | Discharge: 2012-01-23 | Disposition: A | Discharge status: Home / Self Care | Payer: 59 | Source: Ambulatory Visit | Attending: Oncology | Admitting: Oncology

## 2012-01-23 DIAGNOSIS — R05 Cough: Secondary | ICD-10-CM

## 2012-01-23 DIAGNOSIS — R059 Cough, unspecified: Secondary | ICD-10-CM

## 2012-01-23 DIAGNOSIS — C8190 Hodgkin lymphoma, unspecified, unspecified site: Secondary | ICD-10-CM

## 2012-01-23 DIAGNOSIS — C8191 Hodgkin lymphoma, unspecified, lymph nodes of head, face, and neck: Secondary | ICD-10-CM | POA: Insufficient documentation

## 2012-01-23 LAB — T-HELPER CELL (CD4) - (RCID CLINIC ONLY): CD4 % Helper T Cell: 32 % — ABNORMAL LOW (ref 33–55)

## 2012-01-23 LAB — PULMONARY FUNCTION TEST

## 2012-01-23 LAB — HIV-1 RNA QUANT-NO REFLEX-BLD: HIV 1 RNA Quant: <20 copies/mL (ref <20)

## 2012-01-23 MED ORDER — ALBUTEROL SULFATE (5 MG/ML) 0.5% IN NEBU
2.5000 mg | INHALATION_SOLUTION | Freq: Once | RESPIRATORY_TRACT | Status: AC
  Administered 2012-01-23: 2.5 mg via RESPIRATORY_TRACT

## 2012-01-24 ENCOUNTER — Other Ambulatory Visit: Payer: Self-pay | Admitting: Infectious Diseases

## 2012-01-29 ENCOUNTER — Other Ambulatory Visit: Payer: Self-pay | Admitting: Certified Registered Nurse Anesthetist

## 2012-01-30 ENCOUNTER — Telehealth: Payer: Self-pay | Admitting: *Deleted

## 2012-01-30 ENCOUNTER — Other Ambulatory Visit: Payer: Self-pay | Admitting: Infectious Diseases

## 2012-01-30 DIAGNOSIS — B20 Human immunodeficiency virus [HIV] disease: Secondary | ICD-10-CM

## 2012-01-30 NOTE — Telephone Encounter (Signed)
Pt reminded

## 2012-01-31 ENCOUNTER — Other Ambulatory Visit: Payer: Self-pay | Admitting: Oncology

## 2012-01-31 ENCOUNTER — Other Ambulatory Visit: Payer: 59

## 2012-02-02 ENCOUNTER — Ambulatory Visit (HOSPITAL_BASED_OUTPATIENT_CLINIC_OR_DEPARTMENT_OTHER): Payer: 59

## 2012-02-02 ENCOUNTER — Ambulatory Visit (HOSPITAL_BASED_OUTPATIENT_CLINIC_OR_DEPARTMENT_OTHER): Payer: 59 | Admitting: Oncology

## 2012-02-02 ENCOUNTER — Telehealth: Payer: Self-pay | Admitting: Oncology

## 2012-02-02 ENCOUNTER — Encounter: Payer: Self-pay | Admitting: Oncology

## 2012-02-02 ENCOUNTER — Other Ambulatory Visit (HOSPITAL_BASED_OUTPATIENT_CLINIC_OR_DEPARTMENT_OTHER): Payer: 59 | Admitting: Lab

## 2012-02-02 VITALS — BP 140/96 | HR 73 | Temp 97.8°F | Ht 72.0 in | Wt 284.6 lb

## 2012-02-02 DIAGNOSIS — Z5111 Encounter for antineoplastic chemotherapy: Secondary | ICD-10-CM

## 2012-02-02 DIAGNOSIS — B20 Human immunodeficiency virus [HIV] disease: Secondary | ICD-10-CM

## 2012-02-02 DIAGNOSIS — C8190 Hodgkin lymphoma, unspecified, unspecified site: Secondary | ICD-10-CM

## 2012-02-02 DIAGNOSIS — R059 Cough, unspecified: Secondary | ICD-10-CM

## 2012-02-02 DIAGNOSIS — R05 Cough: Secondary | ICD-10-CM

## 2012-02-02 DIAGNOSIS — F4310 Post-traumatic stress disorder, unspecified: Secondary | ICD-10-CM

## 2012-02-02 LAB — COMPREHENSIVE METABOLIC PANEL
Albumin: 4.8 g/dL (ref 3.5–5.2)
Alkaline Phosphatase: 84 U/L (ref 39–117)
BUN: 18 mg/dL (ref 6–23)
Glucose, Bld: 161 mg/dL — ABNORMAL HIGH (ref 70–99)
Potassium: 4.4 mEq/L (ref 3.5–5.3)

## 2012-02-02 LAB — CBC WITH DIFFERENTIAL/PLATELET
Basophils Absolute: 0 10*3/uL (ref 0.0–0.1)
EOS%: 1.3 % (ref 0.0–7.0)
HCT: 36.5 % — ABNORMAL LOW (ref 38.4–49.9)
HGB: 12.1 g/dL — ABNORMAL LOW (ref 13.0–17.1)
MCH: 30.3 pg (ref 27.2–33.4)
MCV: 91.5 fL (ref 79.3–98.0)
MONO%: 7.2 % (ref 0.0–14.0)
NEUT%: 67.3 % (ref 39.0–75.0)

## 2012-02-02 MED ORDER — VINBLASTINE SULFATE CHEMO INJECTION 1 MG/ML
6.0000 mg/m2 | Freq: Once | INTRAVENOUS | Status: AC
  Administered 2012-02-02: 16 mg via INTRAVENOUS
  Filled 2012-02-02: qty 16

## 2012-02-02 MED ORDER — SODIUM CHLORIDE 0.9 % IV SOLN
375.0000 mg/m2 | Freq: Once | INTRAVENOUS | Status: AC
  Administered 2012-02-02: 1000 mg via INTRAVENOUS
  Filled 2012-02-02: qty 50

## 2012-02-02 MED ORDER — DEXAMETHASONE SODIUM PHOSPHATE 4 MG/ML IJ SOLN
20.0000 mg | Freq: Once | INTRAMUSCULAR | Status: AC
  Administered 2012-02-02: 20 mg via INTRAVENOUS

## 2012-02-02 MED ORDER — SODIUM CHLORIDE 0.9 % IJ SOLN
10.0000 mL | INTRAMUSCULAR | Status: DC | PRN
  Administered 2012-02-02: 10 mL
  Filled 2012-02-02: qty 10

## 2012-02-02 MED ORDER — DOXORUBICIN HCL CHEMO IV INJECTION 2 MG/ML
25.0000 mg/m2 | Freq: Once | INTRAVENOUS | Status: AC
  Administered 2012-02-02: 66 mg via INTRAVENOUS
  Filled 2012-02-02: qty 33

## 2012-02-02 MED ORDER — ONDANSETRON 16 MG/50ML IVPB (CHCC)
16.0000 mg | Freq: Once | INTRAVENOUS | Status: AC
  Administered 2012-02-02: 16 mg via INTRAVENOUS

## 2012-02-02 MED ORDER — HEPARIN SOD (PORK) LOCK FLUSH 100 UNIT/ML IV SOLN
500.0000 [IU] | Freq: Once | INTRAVENOUS | Status: AC | PRN
  Administered 2012-02-02: 500 [IU]
  Filled 2012-02-02: qty 5

## 2012-02-02 MED ORDER — SODIUM CHLORIDE 0.9 % IV SOLN
10.0000 [IU]/m2 | Freq: Once | INTRAVENOUS | Status: AC
  Administered 2012-02-02: 27 [IU] via INTRAVENOUS
  Filled 2012-02-02: qty 9

## 2012-02-02 MED ORDER — SODIUM CHLORIDE 0.9 % IV SOLN
Freq: Once | INTRAVENOUS | Status: AC
  Administered 2012-02-02: 10:00:00 via INTRAVENOUS

## 2012-02-02 NOTE — Progress Notes (Signed)
[facility]  Telephone:([phone] [phone]   OFFICE PROGRESS NOTE   Cc:  KHAN,JABER A, MD  DIAGNOSIS: stage II classical Hodgkins' lymphoma; mixed cellularity type, (at least 3 different areas of disease in bilateral neck).   PAST THERAPY: Biopsy only   CURRENT THERAPY: started on 11/24/2011 ABVD d1, 15 every 4 weeks with Neulasta the day after. Goal is for 4 cycles total followed by consolidative radiation  INTERVAL HISTORY: Jon Shannon 46 y.o. male returns for regular follow up with his mother.  He reported very mild neuropathy in the fingers and toes.  He has no problem typing, texting, holding utensils.  Cough has resolved.  He denied SOB, chest pain, DOE.  He no longer feels any adenopathy.  He has mild nausea for a few days after chemo; vomited once after his last cycle of chemotherapy. He has mild fatigue from chemo; however, he is still working full time.   Patient denies fever, anorexia, weight loss, fatigue, headache, visual changes, confusion, drenching night sweats,  mucositis, odynophagia, dysphagia, jaundice, chest pain, palpitation, gum bleeding, epistaxis, hematemesis, hemoptysis, abdominal pain, abdominal swelling, early satiety, melena, hematochezia, hematuria, skin rash, spontaneous bleeding, joint swelling, joint pain, heat or cold intolerance, bowel bladder incontinence, back pain, focal motor weakness, depression.    Past Medical History  Diagnosis Date  . Hyperlipidemia 08/07/2011  . Hodgkin's lymphoma 11/09/2011  . HIV INFECTION 11/30/2009    no history of opportunistic infection  . HYPERTENSION 11/30/2009  . Hypertriglyceridemia   . PTSD (post-traumatic stress disorder)   . DJD (degenerative joint disease)   . Hepatitis A     resolved.     Past Surgical History  Procedure Date  . Tonsillectomy   . Left cervical node excisional biopsy 10/2011  . Left index finger trauma repair     Current Outpatient Prescriptions  Medication  Sig Dispense Refill  . bisoprolol-hydrochlorothiazide (ZIAC) 5-6.25 MG per tablet Take 1 tablet by mouth every morning.       . Cholecalciferol (VITAMIN D3) 1000 UNITS tablet Take 1,000 Units by mouth daily.        . diphenhydrAMINE (BENADRYL) 25 MG tablet Take 25 mg by mouth at bedtime as needed. For sleep      . escitalopram (LEXAPRO) 10 MG tablet Take 10 mg by mouth daily.        Marland Kitchen GARLIC OIL PO Take 1 tablet by mouth daily.      Marland Kitchen gemfibrozil (LOPID) 600 MG tablet Take 600 mg by mouth 2 (two) times daily.      Marland Kitchen HYDROcodone-acetaminophen (NORCO) 5-325 MG per tablet Take 1 tablet by mouth every 6 (six) hours as needed. For pain      . lidocaine-prilocaine (EMLA) cream Apply to [facility] site one hour prior to needle sticks as needed to numb area.  30 g  2  . loratadine (CLARITIN) 10 MG tablet Take 10 mg by mouth daily.        Marland Kitchen LORazepam (ATIVAN) 0.5 MG tablet Take 1 tablet (0.5 mg total) by mouth every 6 (six) hours as needed (Anticipation Nausea or vomiting).  30 tablet  0  . metFORMIN (GLUCOPHAGE) 500 MG tablet Take by mouth Twice daily.      . ondansetron (ZOFRAN) 8 MG tablet Take 1 tab two times a day starting the day after chemo for 3 days. Then take 1 tab two times a day as needed for nausea or vomiting.  30 tablet  1  .  prochlorperazine (COMPAZINE) 10 MG tablet Take 1 tablet (10 mg total) by mouth every 6 (six) hours as needed (Nausea or vomiting).  30 tablet  1  . raltegravir (ISENTRESS) 400 MG tablet Take 400 mg by mouth 2 (two) times daily.      . TRUVADA 200-300 MG per tablet TAKE 1 TABLET BY MOUTH DAILY.  30 tablet  6  . vitamin C (ASCORBIC ACID) 500 MG tablet Take 1,000 mg by mouth daily.       No current facility-administered medications for this visit.   Facility-Administered Medications Ordered in Other Visits  Medication Dose Route Frequency Provider Last Rate Last Dose  . 0.9 %  sodium chloride infusion   Intravenous Once Exie Parody, MD 20 mL/hr at 02/02/12 1025    .  bleomycin (BLEOCIN) 27 Units in sodium chloride 0.9 % 50 mL chemo infusion  10 Units/m2 (Treatment Plan Actual) Intravenous Once Exie Parody, MD      . dacarbazine (DTIC) 1,000 mg in sodium chloride 0.9 % 250 mL chemo infusion  375 mg/m2 (Treatment Plan Actual) Intravenous Once Exie Parody, MD      . dexamethasone (DECADRON) injection 20 mg  20 mg Intravenous Once Exie Parody, MD   20 mg at 02/02/12 1033  . DOXOrubicin (ADRIAMYCIN) chemo injection 66 mg  25 mg/m2 (Treatment Plan Actual) Intravenous Once Exie Parody, MD   66 mg at 02/02/12 1120  . heparin lock flush 100 unit/mL  500 Units Intracatheter Once PRN Exie Parody, MD      . ondansetron (ZOFRAN) IVPB 16 mg  16 mg Intravenous Once Exie Parody, MD   16 mg at 02/02/12 1032  . sodium chloride 0.9 % injection 10 mL  10 mL Intracatheter PRN Exie Parody, MD      . vinBLAStine (VELBAN) chemo injection 16 mg  6 mg/m2 (Treatment Plan Actual) Intravenous Once Exie Parody, MD   16 mg at 02/02/12 1137    ALLERGIES:  is allergic to shrimp.  REVIEW OF SYSTEMS:  The rest of the 14-point review of system was negative.   Filed Vitals:   02/02/12 0925  BP: 140/96  Pulse: 73  Temp: 97.8 F (36.6 C)   Wt Readings from Last 3 Encounters:  02/02/12 284 lb 9.6 oz (129.094 kg)  01/17/12 288 lb 4.8 oz (130.772 kg)  01/05/12 290 lb 11.2 oz (131.861 kg)   ECOG Performance status:  1  PHYSICAL EXAMINATION:   General: Obese male in no acute distress. Eyes: no scleral icterus. ENT: There were no oropharyngeal lesions. Neck was without thyromegaly. Lymphatics: Negative cervical, supraclavicular or axillary adenopathy. Left cervical neck node excisional scar has healed without any erythema, purulent discharge or pain on palpation. I could not appreciate any definite cervical adenopathy. Respiratory: lungs were clear bilaterally without wheezing or crackles. Cardiovascular: Regular rate and rhythm, S1/S2, without murmur, rub or gallop. There was no pedal edema. GI: abdomen was  soft, flat, nontender, nondistended, without organomegaly. Muscoloskeletal: no spinal tenderness of palpation of vertebral spine. Skin exam was without echymosis, petichae. Neuro exam was nonfocal. Patient was able to get on and off exam table without assistance. Gait was normal. Patient was alerted and oriented. Attention was good. Language was appropriate. Mood was normal without depression. Speech was not pressured. Thought content was not tangential.    LABORATORY/RADIOLOGY DATA:  Lab Results  Component Value Date   WBC 7.5 02/02/2012   HGB 12.1* 02/02/2012   HCT  36.5* 02/02/2012   PLT 198 02/02/2012   GLUCOSE 160* 01/22/2012   CHOL 136 01/22/2012   TRIG 563* 01/22/2012   HDL 20* 01/22/2012   LDLCALC Comment:   Not calculated due to Triglyceride >400. Suggest ordering Direct LDL (Unit Code: 16109).   Total Cholesterol/HDL Ratio:CHD Risk                        Coronary Heart Disease Risk Table                                        Men       Women          1/2 Average Risk              3.4        3.3              Average Risk              5.0        4.4           2X Average Risk              9.6        7.1           3X Average Risk             23.4       11.0 Use the calculated Patient Ratio above and the CHD Risk table  to determine the patient's CHD Risk. ATP III Classification (LDL):       < 100        mg/dL         Optimal      604 - 129     mg/dL         Near or Above Optimal      130 - 159     mg/dL         Borderline High      160 - 189     mg/dL         High       > 540        mg/dL         Very High   9/81/1914   ALKPHOS 107 01/22/2012   ALT 38 01/22/2012   AST 37 01/22/2012   NA 138 01/22/2012   K 3.8 01/22/2012   CL 98 01/22/2012   CREATININE 1.07 01/22/2012   BUN 16 01/22/2012   CO2 30 01/22/2012   INR 0.98 11/20/2011    ASSESSMENT AND PLAN:   1. HIV: He reports that he is compliant with Truvada and Isentress.   2. Hyperlipidemia: He is on gemfibrozil.   3. PTSD: He is on Lexapro with his  psychiatrist. His mood while going through chemo has been stable.   4. Hypertension: Good control with Ziac.   5. Newly diagnosed left cervical neck node classical Hodgkins' lymphoma: Stage II, low risk, nonbulky. There was only slightly FDG uptake in the abdomen without definitive adenopathy.  - He is here for cycle 3 day 15 of ABVD. He had a complete radiographic response on his PET scan after 2 cycles of chemo.  He has grade 1 neuropathy, grade 1 anemia, grade 1 nausea.  He does not have dose limiting toxicity to warrant dose modification of  chemo.  I recommended to patient to proceed with 2 more cycles of chemo for 4 cycles total.  We will repeat PET scan after the 4th cycle.  If he again has continuing response, I will refer him to Rad Onc for evaluation of consolidative radiation.   6.  Cough:  Most likely due to nasal drainage/allergy.  I advised him on OTC nasal spray and Nasonex prn if symptoms persists or worsens. Repeat PFT was stable and cough has now resolved.  7. Follow-up: In 2 weeks to begin cycle 4 of his chemotherapy.

## 2012-02-02 NOTE — Patient Instructions (Signed)
[facility] Discharge Instructions for Patients Receiving Chemotherapy  Today you received the following chemotherapy agents Adriamycin/Bleomycin/Dacarbazine/Vinblasine To help prevent nausea and vomiting after your treatment, we encourage you to take your nausea medication as prescribed.  If you develop nausea and vomiting that is not controlled by your nausea medication, call the clinic. If it is after clinic hours your family physician or the after hours number for the clinic or go to the Emergency Department.   BELOW ARE SYMPTOMS THAT SHOULD BE REPORTED IMMEDIATELY:  *FEVER GREATER THAN 100.5 F  *CHILLS WITH OR WITHOUT FEVER  NAUSEA AND VOMITING THAT IS NOT CONTROLLED WITH YOUR NAUSEA MEDICATION  *UNUSUAL SHORTNESS OF BREATH  *UNUSUAL BRUISING OR BLEEDING  TENDERNESS IN MOUTH AND THROAT WITH OR WITHOUT PRESENCE OF ULCERS  *URINARY PROBLEMS  *BOWEL PROBLEMS  UNUSUAL RASH Items with * indicate a potential emergency and should be followed up as soon as possible.  One of the nurses will contact you 24 hours after your treatment. Please let the nurse know about any problems that you may have experienced. Feel free to call the clinic you have any questions or concerns. The clinic phone number is [phone].   I have been informed and understand all the instructions given to me. I know to contact the clinic, my physician, or go to the Emergency Department if any problems should occur. I do not have any questions at this time, but understand that I may call the clinic during office hours   should I have any questions or need assistance in obtaining follow up care.    __________________________________________  _____________  __________ Signature of Patient or Authorized Representative            Date                   Time    __________________________________________ Nurse's Signature

## 2012-02-02 NOTE — Telephone Encounter (Signed)
appts made and printed for pt aom °

## 2012-02-03 ENCOUNTER — Ambulatory Visit (HOSPITAL_BASED_OUTPATIENT_CLINIC_OR_DEPARTMENT_OTHER): Payer: 59

## 2012-02-03 VITALS — BP 127/79 | HR 72 | Temp 98.4°F

## 2012-02-03 DIAGNOSIS — C8190 Hodgkin lymphoma, unspecified, unspecified site: Secondary | ICD-10-CM

## 2012-02-03 DIAGNOSIS — Z5189 Encounter for other specified aftercare: Secondary | ICD-10-CM

## 2012-02-03 MED ORDER — PEGFILGRASTIM INJECTION 6 MG/0.6ML
6.0000 mg | Freq: Once | SUBCUTANEOUS | Status: AC
  Administered 2012-02-03: 6 mg via SUBCUTANEOUS

## 2012-02-05 ENCOUNTER — Encounter: Payer: Self-pay | Admitting: Infectious Diseases

## 2012-02-05 ENCOUNTER — Ambulatory Visit (INDEPENDENT_AMBULATORY_CARE_PROVIDER_SITE_OTHER): Payer: 59 | Admitting: Infectious Diseases

## 2012-02-05 VITALS — BP 125/81 | HR 77 | Temp 98.0°F | Ht 72.0 in | Wt 283.0 lb

## 2012-02-05 DIAGNOSIS — C8190 Hodgkin lymphoma, unspecified, unspecified site: Secondary | ICD-10-CM

## 2012-02-05 DIAGNOSIS — B20 Human immunodeficiency virus [HIV] disease: Secondary | ICD-10-CM

## 2012-02-05 DIAGNOSIS — E781 Pure hyperglyceridemia: Secondary | ICD-10-CM

## 2012-02-05 NOTE — Assessment & Plan Note (Signed)
Having difficulty with CTX but I encouraged him to hang in there for the next month while this completes. He is worried that this (and his XRT) will affect his CD4 count/immune system. I encouraged him that his CD4 has dropped but is still within the normal range. My great appreciation to Dr Gaylyn Rong

## 2012-02-05 NOTE — Progress Notes (Signed)
Subjective:    Patient ID: Jon Shannon, male    DOB: [birth year], 46 y.o.   MRN: [id number]  HPI 46 yo M with HIV+ 09-2009, (G190A mutation). He was started on ISN/TRV. He also has a hx of hyperlipidemia. At previous visit was started on lopid. His Trig has come down from >700 to 300s now. His LFTs have been stable.  He was found to have lymphoma on bx of LAN of his neck. He was started on ABVD 11-24-11. Feeling poorly due to chemo- feels weak ("my legs are like noodles"), difficulty with apettite. Has 3 meds he can take (zofran, lorazepam, can't remember other). Has 2 more sessions of CTX (03-01-12) and then XRT.   HIV 1 RNA Quant (copies/mL)  Date Value  01/22/2012 <20   07/20/2011 <20   02/15/2011 <20      CD4 T Cell Abs (cmm)  Date Value  01/22/2012 640   07/20/2011 940   02/15/2011 900    Lab Results  Component Value Date   CHOL 136 01/22/2012   HDL 20* 01/22/2012   LDLCALC Comment:   Not calculated due to Triglyceride >400. Suggest ordering Direct LDL (Unit Code: 08657).   Total Cholesterol/HDL Ratio:CHD Risk                        Coronary Heart Disease Risk Table                                        Men       Women          1/2 Average Risk              3.4        3.3              Average Risk              5.0        4.4           2X Average Risk              9.6        7.1           3X Average Risk             23.4       11.0 Use the calculated Patient Ratio above and the CHD Risk table  to determine the patient's CHD Risk. ATP III Classification (LDL):       < 100        mg/dL         Optimal      846 - 129     mg/dL         Near or Above Optimal      130 - 159     mg/dL         Borderline High      160 - 189     mg/dL         High       > 962        mg/dL         Very High   9/52/8413   TRIG 563* 01/22/2012   CHOLHDL 6.8 01/22/2012    Still taking lopid.     Review of Systems  Constitutional: Positive for unexpected weight change.  43# wt loss per pt (28# per flowsheet)    HENT: Negative for nosebleeds.        Has had sores inside nose.   Hematological: Does not bruise/bleed easily.       Objective:   Physical Exam  Constitutional: He appears well-developed and well-nourished.  HENT:  Mouth/Throat: No oropharyngeal exudate.    Neck: Neck supple.  Cardiovascular: Normal rate, regular rhythm and normal heart sounds.   Pulmonary/Chest: Effort normal and breath sounds normal.  Abdominal: Soft. Bowel sounds are normal. He exhibits no distension. There is no tenderness.  Lymphadenopathy:    He has no cervical adenopathy.          Assessment & Plan:

## 2012-02-05 NOTE — Assessment & Plan Note (Signed)
Despite hs problems with CTX, he is doing well. Will continue his current art. PNVx is up to date. He is Hep A immune. Will see him back in 4 months with labs prior.

## 2012-02-05 NOTE — Assessment & Plan Note (Signed)
He continues to take his lopid. Will f/u his labs, lfts, lipids, at next visit.

## 2012-02-16 ENCOUNTER — Other Ambulatory Visit (HOSPITAL_BASED_OUTPATIENT_CLINIC_OR_DEPARTMENT_OTHER): Payer: 59 | Admitting: Lab

## 2012-02-16 ENCOUNTER — Encounter: Payer: Self-pay | Admitting: Oncology

## 2012-02-16 ENCOUNTER — Ambulatory Visit (HOSPITAL_BASED_OUTPATIENT_CLINIC_OR_DEPARTMENT_OTHER): Payer: 59 | Admitting: Oncology

## 2012-02-16 ENCOUNTER — Ambulatory Visit (HOSPITAL_BASED_OUTPATIENT_CLINIC_OR_DEPARTMENT_OTHER): Payer: 59

## 2012-02-16 VITALS — BP 128/82 | HR 75 | Temp 97.7°F | Ht 72.0 in | Wt 283.9 lb

## 2012-02-16 DIAGNOSIS — R059 Cough, unspecified: Secondary | ICD-10-CM

## 2012-02-16 DIAGNOSIS — R05 Cough: Secondary | ICD-10-CM

## 2012-02-16 DIAGNOSIS — E785 Hyperlipidemia, unspecified: Secondary | ICD-10-CM

## 2012-02-16 DIAGNOSIS — B20 Human immunodeficiency virus [HIV] disease: Secondary | ICD-10-CM

## 2012-02-16 DIAGNOSIS — C8190 Hodgkin lymphoma, unspecified, unspecified site: Secondary | ICD-10-CM

## 2012-02-16 DIAGNOSIS — C8121 Mixed cellularity classical Hodgkin lymphoma, lymph nodes of head, face, and neck: Secondary | ICD-10-CM

## 2012-02-16 DIAGNOSIS — Z5111 Encounter for antineoplastic chemotherapy: Secondary | ICD-10-CM

## 2012-02-16 LAB — COMPREHENSIVE METABOLIC PANEL
ALT: 46 U/L (ref 0–53)
AST: 62 U/L — ABNORMAL HIGH (ref 0–37)
Albumin: 4.6 g/dL (ref 3.5–5.2)
Alkaline Phosphatase: 104 U/L (ref 39–117)
Glucose, Bld: 157 mg/dL — ABNORMAL HIGH (ref 70–99)
Potassium: 4 mEq/L (ref 3.5–5.3)
Sodium: 136 mEq/L (ref 135–145)
Total Bilirubin: 0.4 mg/dL (ref 0.3–1.2)
Total Protein: 7.2 g/dL (ref 6.0–8.3)

## 2012-02-16 LAB — CBC WITH DIFFERENTIAL/PLATELET
Basophils Absolute: 0.1 10*3/uL (ref 0.0–0.1)
EOS%: 0.9 % (ref 0.0–7.0)
Eosinophils Absolute: 0.1 10*3/uL (ref 0.0–0.5)
HGB: 12.1 g/dL — ABNORMAL LOW (ref 13.0–17.1)
MCH: 30.8 pg (ref 27.2–33.4)
MCV: 91.6 fL (ref 79.3–98.0)
MONO%: 5.1 % (ref 0.0–14.0)
NEUT#: 8.4 10*3/uL — ABNORMAL HIGH (ref 1.5–6.5)
RBC: 3.93 10*6/uL — ABNORMAL LOW (ref 4.20–5.82)
RDW: 16.1 % — ABNORMAL HIGH (ref 11.0–14.6)
lymph#: 1.7 10*3/uL (ref 0.9–3.3)
nRBC: 1 % — ABNORMAL HIGH (ref 0–0)

## 2012-02-16 MED ORDER — ONDANSETRON 16 MG/50ML IVPB (CHCC)
16.0000 mg | Freq: Once | INTRAVENOUS | Status: AC
  Administered 2012-02-16: 16 mg via INTRAVENOUS

## 2012-02-16 MED ORDER — SODIUM CHLORIDE 0.9 % IJ SOLN
10.0000 mL | INTRAMUSCULAR | Status: DC | PRN
  Administered 2012-02-16: 10 mL
  Filled 2012-02-16: qty 10

## 2012-02-16 MED ORDER — DEXAMETHASONE SODIUM PHOSPHATE 4 MG/ML IJ SOLN
20.0000 mg | Freq: Once | INTRAMUSCULAR | Status: AC
  Administered 2012-02-16: 20 mg via INTRAVENOUS

## 2012-02-16 MED ORDER — SODIUM CHLORIDE 0.9 % IV SOLN
10.0000 [IU]/m2 | Freq: Once | INTRAVENOUS | Status: AC
  Administered 2012-02-16: 27 [IU] via INTRAVENOUS
  Filled 2012-02-16: qty 9

## 2012-02-16 MED ORDER — SODIUM CHLORIDE 0.9 % IV SOLN
375.0000 mg/m2 | Freq: Once | INTRAVENOUS | Status: AC
  Administered 2012-02-16: 1000 mg via INTRAVENOUS
  Filled 2012-02-16: qty 50

## 2012-02-16 MED ORDER — DOXYCYCLINE HYCLATE 100 MG PO TABS: 100.0000 mg | ORAL_TABLET | Freq: Two times a day (BID) | ORAL | Status: AC

## 2012-02-16 MED ORDER — VINBLASTINE SULFATE CHEMO INJECTION 1 MG/ML
6.0000 mg/m2 | Freq: Once | INTRAVENOUS | Status: AC
  Administered 2012-02-16: 16 mg via INTRAVENOUS
  Filled 2012-02-16: qty 16

## 2012-02-16 MED ORDER — DOXORUBICIN HCL CHEMO IV INJECTION 2 MG/ML
25.0000 mg/m2 | Freq: Once | INTRAVENOUS | Status: AC
  Administered 2012-02-16: 66 mg via INTRAVENOUS
  Filled 2012-02-16: qty 33

## 2012-02-16 MED ORDER — HEPARIN SOD (PORK) LOCK FLUSH 100 UNIT/ML IV SOLN
500.0000 [IU] | Freq: Once | INTRAVENOUS | Status: AC | PRN
  Administered 2012-02-16: 500 [IU]
  Filled 2012-02-16: qty 5

## 2012-02-16 MED ORDER — SODIUM CHLORIDE 0.9 % IV SOLN: Freq: Once | INTRAVENOUS | Status: DC

## 2012-02-16 NOTE — Progress Notes (Signed)
[facility]  Telephone:(336) [phone] [phone]) [phone]   OFFICE PROGRESS NOTE   Cc:  KHAN,JABER A, MD  DIAGNOSIS: stage II classical Hodgkins' lymphoma; mixed cellularity type, (at least 3 different areas of disease in bilateral neck).   PAST THERAPY: Biopsy only   CURRENT THERAPY: started on 11/24/2011 ABVD d1, 15 every 4 weeks with Neulasta the day after. Goal is for 4 cycles total followed by consolidative radiation  INTERVAL HISTORY: Jon Shannon 46 y.o. male returns for regular follow up with his sister and niece.  He reported very mild neuropathy in the fingers and toes.  He has no problem typing, texting, holding utensils.  Reports that he stubbed his left pinky toe and the nail is ready to come off. He also dropped a can on his right great toe and this caused bleeding. His nail is hurting him. He denies any purulent drainage from his toes. Cough has resolved.  He denied SOB, chest pain, DOE.  He no longer feels any adenopathy.  He has mild nausea for a few days after chemo; vomited once after his last cycle of chemotherapy. He has mild fatigue from chemo; however, he is still working full time.   Patient denies fever, anorexia, weight loss, fatigue, headache, visual changes, confusion, drenching night sweats,  mucositis, odynophagia, dysphagia, jaundice, chest pain, palpitation, gum bleeding, epistaxis, hematemesis, hemoptysis, abdominal pain, abdominal swelling, early satiety, melena, hematochezia, hematuria, skin rash, spontaneous bleeding, joint swelling, joint pain, heat or cold intolerance, bowel bladder incontinence, back pain, focal motor weakness, depression.    Past Medical History  Diagnosis Date  . Hyperlipidemia 08/07/2011  . Hodgkin's lymphoma 11/09/2011  . HIV INFECTION 11/30/2009    no history of opportunistic infection  . HYPERTENSION 11/30/2009  . Hypertriglyceridemia   . PTSD (post-traumatic stress disorder)   . DJD (degenerative joint disease)     . Hepatitis A     resolved.     Past Surgical History  Procedure Date  . Tonsillectomy   . Left cervical node excisional biopsy 10/2011  . Left index finger trauma repair     Current Outpatient Prescriptions  Medication Sig Dispense Refill  . bisoprolol-hydrochlorothiazide (ZIAC) 5-6.25 MG per tablet Take 1 tablet by mouth every morning.       . Cholecalciferol (VITAMIN D3) 1000 UNITS tablet Take 1,000 Units by mouth daily.        . diphenhydrAMINE (BENADRYL) 25 MG tablet Take 25 mg by mouth at bedtime as needed. For sleep      . doxycycline (VIBRA-TABS) 100 MG tablet Take 1 tablet (100 mg total) by mouth 2 (two) times daily.  20 tablet  0  . escitalopram (LEXAPRO) 10 MG tablet Take 10 mg by mouth daily.        Marland Kitchen GARLIC OIL PO Take 1 tablet by mouth daily.      Marland Kitchen gemfibrozil (LOPID) 600 MG tablet Take 600 mg by mouth 2 (two) times daily.      Marland Kitchen HYDROcodone-acetaminophen (NORCO) 5-325 MG per tablet Take 1 tablet by mouth every 6 (six) hours as needed. For pain      . lidocaine-prilocaine (EMLA) cream Apply to [facility] site one hour prior to needle sticks as needed to numb area.  30 g  2  . loratadine (CLARITIN) 10 MG tablet Take 10 mg by mouth daily.        Marland Kitchen LORazepam (ATIVAN) 0.5 MG tablet Take 1 tablet (0.5 mg total) by mouth every 6 (six) hours  as needed (Anticipation Nausea or vomiting).  30 tablet  0  . metFORMIN (GLUCOPHAGE) 500 MG tablet Take by mouth Twice daily.      . ondansetron (ZOFRAN) 8 MG tablet Take 1 tab two times a day starting the day after chemo for 3 days. Then take 1 tab two times a day as needed for nausea or vomiting.  30 tablet  1  . prochlorperazine (COMPAZINE) 10 MG tablet Take 1 tablet (10 mg total) by mouth every 6 (six) hours as needed (Nausea or vomiting).  30 tablet  1  . raltegravir (ISENTRESS) 400 MG tablet Take 400 mg by mouth 2 (two) times daily.      . TRUVADA 200-300 MG per tablet TAKE 1 TABLET BY MOUTH DAILY.  30 tablet  6  . vitamin C (ASCORBIC  ACID) 500 MG tablet Take 1,000 mg by mouth daily.       No current facility-administered medications for this visit.   Facility-Administered Medications Ordered in Other Visits  Medication Dose Route Frequency Provider Last Rate Last Dose  . 0.9 %  sodium chloride infusion   Intravenous Once Exie Parody, MD      . bleomycin (BLEOCIN) 27 Units in sodium chloride 0.9 % 50 mL chemo infusion  10 Units/m2 (Treatment Plan Actual) Intravenous Once Exie Parody, MD 354 mL/hr at 02/16/12 1220 27 Units at 02/16/12 1220  . dacarbazine (DTIC) 1,000 mg in sodium chloride 0.9 % 250 mL chemo infusion  375 mg/m2 (Treatment Plan Actual) Intravenous Once Exie Parody, MD 300 mL/hr at 02/16/12 1238 1,000 mg at 02/16/12 1238  . dexamethasone (DECADRON) injection 20 mg  20 mg Intravenous Once Exie Parody, MD   20 mg at 02/16/12 1118  . DOXOrubicin (ADRIAMYCIN) chemo injection 66 mg  25 mg/m2 (Treatment Plan Actual) Intravenous Once Exie Parody, MD   66 mg at 02/16/12 1201  . heparin lock flush 100 unit/mL  500 Units Intracatheter Once PRN Exie Parody, MD      . ondansetron (ZOFRAN) IVPB 16 mg  16 mg Intravenous Once Exie Parody, MD   16 mg at 02/16/12 1118  . sodium chloride 0.9 % injection 10 mL  10 mL Intracatheter PRN Exie Parody, MD      . vinBLAStine (VELBAN) chemo injection 16 mg  6 mg/m2 (Treatment Plan Actual) Intravenous Once Exie Parody, MD   16 mg at 02/16/12 1214    ALLERGIES:  is allergic to shrimp.  REVIEW OF SYSTEMS:  The rest of the 14-point review of system was negative.   Filed Vitals:   02/16/12 0928  BP: 128/82  Pulse: 75  Temp: 97.7 F (36.5 C)   Wt Readings from Last 3 Encounters:  02/16/12 283 lb 14.4 oz (128.776 kg)  02/05/12 283 lb (128.368 kg)  02/02/12 284 lb 9.6 oz (129.094 kg)   ECOG Performance status:  1  PHYSICAL EXAMINATION:   General: Obese male in no acute distress. Eyes: no scleral icterus. ENT: There were no oropharyngeal lesions. Neck was without thyromegaly. Lymphatics: Negative  cervical, supraclavicular or axillary adenopathy. Left cervical neck node excisional scar has healed without any erythema, purulent discharge or pain on palpation. I could not appreciate any definite cervical adenopathy. Respiratory: lungs were clear bilaterally without wheezing or crackles. Cardiovascular: Regular rate and rhythm, S1/S2, without murmur, rub or gallop. There was no pedal edema. GI: abdomen was soft, flat, nontender, nondistended, without organomegaly. Muscoloskeletal: no spinal tenderness of palpation of  vertebral spine. Skin exam was without echymosis, petichae. Neuro exam was nonfocal. Patient was able to get on and off exam table without assistance. Gait was normal. Patient was alerted and oriented. Attention was good. Language was appropriate. Mood was normal without depression. Speech was not pressured. Thought content was not tangential.    LABORATORY/RADIOLOGY DATA:  Lab Results  Component Value Date   WBC 10.8* 02/16/2012   HGB 12.1* 02/16/2012   HCT 36.0* 02/16/2012   PLT 163 02/16/2012   GLUCOSE 161* 02/02/2012   CHOL 136 01/22/2012   TRIG 563* 01/22/2012   HDL 20* 01/22/2012   LDLCALC Comment:   Not calculated due to Triglyceride >400. Suggest ordering Direct LDL (Unit Code: 16109).   Total Cholesterol/HDL Ratio:CHD Risk                        Coronary Heart Disease Risk Table                                        Men       Women          1/2 Average Risk              3.4        3.3              Average Risk              5.0        4.4           2X Average Risk              9.6        7.1           3X Average Risk             23.4       11.0 Use the calculated Patient Ratio above and the CHD Risk table  to determine the patient's CHD Risk. ATP III Classification (LDL):       < 100        mg/dL         Optimal      604 - 129     mg/dL         Near or Above Optimal      130 - 159     mg/dL         Borderline High      160 - 189     mg/dL         High       > 540        mg/dL          Very High   9/81/1914   ALKPHOS 84 02/02/2012   ALT 44 02/02/2012   AST 60* 02/02/2012   NA 139 02/02/2012   K 4.4 02/02/2012   CL 101 02/02/2012   CREATININE 0.90 02/02/2012   BUN 18 02/02/2012   CO2 25 02/02/2012   INR 0.98 11/20/2011    ASSESSMENT AND PLAN:   1. HIV: He reports that he is compliant with Truvada and Isentress.   2. Hyperlipidemia: He is on gemfibrozil.   3. PTSD: He is on Lexapro with his psychiatrist. His mood while going through chemo has been stable.   4. Hypertension: Good control with Ziac.   5. Newly  diagnosed left cervical neck node classical Hodgkins' lymphoma: Stage II, low risk, nonbulky. There was only slightly FDG uptake in the abdomen without definitive adenopathy.  - He is here for cycle 4 day 11 of ABVD. He had a complete radiographic response on his PET scan after 2 cycles of chemo.  He has grade 1 neuropathy, grade 1 anemia, grade 1 nausea.  He does not have dose limiting toxicity to warrant dose modification of chemo.  I recommended to patient that he proceed for 4 cycles total.  We will repeat PET scan after the 4th cycle.  If he again has continuing response, I will refer him to Rad Onc for evaluation of consolidative radiation.   6.  Cough:  Most likely due to nasal drainage/allergy.  I advised him on OTC nasal spray and Nasonex prn if symptoms persists or worsens. Repeat PFT was stable and cough has now resolved.  7. Follow-up: In 2 weeks for cycle 4, day 15 of his chemotherapy.

## 2012-02-16 NOTE — Patient Instructions (Signed)
[facility] Discharge Instructions for Patients Receiving Chemotherapy  Today you received the following chemotherapy agents Adriamycin/Bleomycin/Velban/DTIC To help prevent nausea and vomiting after your treatment, we encourage you to take your nausea medication as prescribed.  If you develop nausea and vomiting that is not controlled by your nausea medication, call the clinic. If it is after clinic hours your family physician or the after hours number for the clinic or go to the Emergency Department.   BELOW ARE SYMPTOMS THAT SHOULD BE REPORTED IMMEDIATELY:  *FEVER GREATER THAN 100.5 F  *CHILLS WITH OR WITHOUT FEVER  NAUSEA AND VOMITING THAT IS NOT CONTROLLED WITH YOUR NAUSEA MEDICATION  *UNUSUAL SHORTNESS OF BREATH  *UNUSUAL BRUISING OR BLEEDING  TENDERNESS IN MOUTH AND THROAT WITH OR WITHOUT PRESENCE OF ULCERS  *URINARY PROBLEMS  *BOWEL PROBLEMS  UNUSUAL RASH Items with * indicate a potential emergency and should be followed up as soon as possible.  One of the nurses will contact you 24 hours after your treatment. Please let the nurse know about any problems that you may have experienced. Feel free to call the clinic you have any questions or concerns. The clinic phone number is [phone].   I have been informed and understand all the instructions given to me. I know to contact the clinic, my physician, or go to the Emergency Department if any problems should occur. I do not have any questions at this time, but understand that I may call the clinic during office hours   should I have any questions or need assistance in obtaining follow up care.    __________________________________________  _____________  __________ Signature of Patient or Authorized Representative            Date                   Time    __________________________________________ Nurse's Signature

## 2012-02-17 ENCOUNTER — Ambulatory Visit (HOSPITAL_BASED_OUTPATIENT_CLINIC_OR_DEPARTMENT_OTHER): Payer: 59

## 2012-02-17 VITALS — BP 151/78 | HR 76 | Temp 98.5°F

## 2012-02-17 DIAGNOSIS — Z5189 Encounter for other specified aftercare: Secondary | ICD-10-CM

## 2012-02-17 DIAGNOSIS — C8190 Hodgkin lymphoma, unspecified, unspecified site: Secondary | ICD-10-CM

## 2012-02-17 DIAGNOSIS — C8121 Mixed cellularity classical Hodgkin lymphoma, lymph nodes of head, face, and neck: Secondary | ICD-10-CM

## 2012-02-17 DIAGNOSIS — B20 Human immunodeficiency virus [HIV] disease: Secondary | ICD-10-CM

## 2012-02-17 MED ORDER — PEGFILGRASTIM INJECTION 6 MG/0.6ML
6.0000 mg | Freq: Once | SUBCUTANEOUS | Status: AC
  Administered 2012-02-17: 6 mg via SUBCUTANEOUS

## 2012-02-26 ENCOUNTER — Other Ambulatory Visit: Payer: Self-pay | Admitting: *Deleted

## 2012-02-26 DIAGNOSIS — C8190 Hodgkin lymphoma, unspecified, unspecified site: Secondary | ICD-10-CM

## 2012-02-26 DIAGNOSIS — B20 Human immunodeficiency virus [HIV] disease: Secondary | ICD-10-CM

## 2012-02-26 MED ORDER — ONDANSETRON HCL 8 MG PO TABS: ORAL_TABLET | ORAL | Status: DC

## 2012-02-29 NOTE — Patient Instructions (Addendum)
1.  Diagnosis:  Hodgkin's lymphoma. 2.  Treatment:  ABVD with good response so far.  3.  Follow up:  PET scan in about 2 weeks.  Referral to Radiation Oncology for consideration of radiation.

## 2012-03-01 ENCOUNTER — Ambulatory Visit (HOSPITAL_BASED_OUTPATIENT_CLINIC_OR_DEPARTMENT_OTHER): Payer: 59

## 2012-03-01 ENCOUNTER — Ambulatory Visit (HOSPITAL_BASED_OUTPATIENT_CLINIC_OR_DEPARTMENT_OTHER): Payer: 59 | Admitting: Oncology

## 2012-03-01 ENCOUNTER — Ambulatory Visit: Payer: 59 | Admitting: Oncology

## 2012-03-01 ENCOUNTER — Telehealth: Payer: Self-pay | Admitting: Oncology

## 2012-03-01 ENCOUNTER — Other Ambulatory Visit (HOSPITAL_BASED_OUTPATIENT_CLINIC_OR_DEPARTMENT_OTHER): Payer: 59

## 2012-03-01 VITALS — BP 129/88 | HR 66 | Temp 97.6°F | Resp 20 | Ht 72.0 in | Wt 283.6 lb

## 2012-03-01 DIAGNOSIS — Z5111 Encounter for antineoplastic chemotherapy: Secondary | ICD-10-CM

## 2012-03-01 DIAGNOSIS — C8121 Mixed cellularity classical Hodgkin lymphoma, lymph nodes of head, face, and neck: Secondary | ICD-10-CM

## 2012-03-01 DIAGNOSIS — C8190 Hodgkin lymphoma, unspecified, unspecified site: Secondary | ICD-10-CM

## 2012-03-01 DIAGNOSIS — F4310 Post-traumatic stress disorder, unspecified: Secondary | ICD-10-CM

## 2012-03-01 DIAGNOSIS — B20 Human immunodeficiency virus [HIV] disease: Secondary | ICD-10-CM

## 2012-03-01 DIAGNOSIS — I10 Essential (primary) hypertension: Secondary | ICD-10-CM

## 2012-03-01 LAB — COMPREHENSIVE METABOLIC PANEL
ALT: 52 U/L (ref 0–53)
BUN: 12 mg/dL (ref 6–23)
CO2: 26 mEq/L (ref 19–32)
Calcium: 9.9 mg/dL (ref 8.4–10.5)
Chloride: 99 mEq/L (ref 96–112)
Creatinine, Ser: 0.83 mg/dL (ref 0.50–1.35)
Total Bilirubin: 0.4 mg/dL (ref 0.3–1.2)

## 2012-03-01 LAB — CBC WITH DIFFERENTIAL/PLATELET
BASO%: 1.3 % (ref 0.0–2.0)
Eosinophils Absolute: 0.1 10*3/uL (ref 0.0–0.5)
HCT: 36.3 % — ABNORMAL LOW (ref 38.4–49.9)
LYMPH%: 21.2 % (ref 14.0–49.0)
MCHC: 33.1 g/dL (ref 32.0–36.0)
MCV: 91.4 fL (ref 79.3–98.0)
MONO#: 0.5 10*3/uL (ref 0.1–0.9)
MONO%: 7.8 % (ref 0.0–14.0)
NEUT%: 68.2 % (ref 39.0–75.0)
Platelets: 203 10*3/uL (ref 140–400)
RBC: 3.97 10*6/uL — ABNORMAL LOW (ref 4.20–5.82)
nRBC: 1 % — ABNORMAL HIGH (ref 0–0)

## 2012-03-01 MED ORDER — VINBLASTINE SULFATE CHEMO INJECTION 1 MG/ML
6.0000 mg/m2 | Freq: Once | INTRAVENOUS | Status: AC
  Administered 2012-03-01: 16 mg via INTRAVENOUS
  Filled 2012-03-01: qty 16

## 2012-03-01 MED ORDER — SODIUM CHLORIDE 0.9 % IV SOLN
375.0000 mg/m2 | Freq: Once | INTRAVENOUS | Status: AC
  Administered 2012-03-01: 1000 mg via INTRAVENOUS
  Filled 2012-03-01: qty 50

## 2012-03-01 MED ORDER — ONDANSETRON 16 MG/50ML IVPB (CHCC)
16.0000 mg | Freq: Once | INTRAVENOUS | Status: AC
  Administered 2012-03-01: 16 mg via INTRAVENOUS

## 2012-03-01 MED ORDER — SODIUM CHLORIDE 0.9 % IJ SOLN
10.0000 mL | INTRAMUSCULAR | Status: DC | PRN
  Administered 2012-03-01: 10 mL
  Filled 2012-03-01: qty 10

## 2012-03-01 MED ORDER — HEPARIN SOD (PORK) LOCK FLUSH 100 UNIT/ML IV SOLN
500.0000 [IU] | Freq: Once | INTRAVENOUS | Status: AC | PRN
  Administered 2012-03-01: 500 [IU]
  Filled 2012-03-01: qty 5

## 2012-03-01 MED ORDER — DOXORUBICIN HCL CHEMO IV INJECTION 2 MG/ML
25.0000 mg/m2 | Freq: Once | INTRAVENOUS | Status: AC
  Administered 2012-03-01: 66 mg via INTRAVENOUS
  Filled 2012-03-01: qty 33

## 2012-03-01 MED ORDER — DEXAMETHASONE SODIUM PHOSPHATE 4 MG/ML IJ SOLN
20.0000 mg | Freq: Once | INTRAMUSCULAR | Status: AC
  Administered 2012-03-01: 20 mg via INTRAVENOUS

## 2012-03-01 MED ORDER — SODIUM CHLORIDE 0.9 % IV SOLN
Freq: Once | INTRAVENOUS | Status: AC
  Administered 2012-03-01: 11:00:00 via INTRAVENOUS

## 2012-03-01 MED ORDER — SODIUM CHLORIDE 0.9 % IV SOLN
10.0000 [IU]/m2 | Freq: Once | INTRAVENOUS | Status: AC
  Administered 2012-03-01: 27 [IU] via INTRAVENOUS
  Filled 2012-03-01: qty 9

## 2012-03-01 NOTE — Patient Instructions (Signed)
[facility] Discharge Instructions for Patients Receiving Chemotherapy  Today you received the following chemotherapy agents doxorubicin,vinblastin, beomycin,DTic  To help prevent nausea and vomiting after your treatment, we encourage you to take your nausea medication as directed Begin taking it at 6pm and take it as often as prescribed for the next 48 hours as needed.   If you develop nausea and vomiting that is not controlled by your nausea medication, call the clinic. If it is after clinic hours your family physician or the after hours number for the clinic or go to the Emergency Department.   BELOW ARE SYMPTOMS THAT SHOULD BE REPORTED IMMEDIATELY:  *FEVER GREATER THAN 100.5 F  *CHILLS WITH OR WITHOUT FEVER  NAUSEA AND VOMITING THAT IS NOT CONTROLLED WITH YOUR NAUSEA MEDICATION  *UNUSUAL SHORTNESS OF BREATH  *UNUSUAL BRUISING OR BLEEDING  TENDERNESS IN MOUTH AND THROAT WITH OR WITHOUT PRESENCE OF ULCERS  *URINARY PROBLEMS  *BOWEL PROBLEMS  UNUSUAL RASH Items with * indicate a potential emergency and should be followed up as soon as possible.  One of the nurses will contact you 24 hours after your treatment. Please let the nurse know about any problems that you may have experienced. Feel free to call the clinic you have any questions or concerns. The clinic phone number is [phone].   I have been informed and understand all the instructions given to me. I know to contact the clinic, my physician, or go to the Emergency Department if any problems should occur. I do not have any questions at this time, but understand that I may call the clinic during office hours   should I have any questions or need assistance in obtaining follow up care.    __________________________________________  _____________  __________ Signature of Patient or Authorized Representative            Date                   Time    __________________________________________ Nurse's  Signature

## 2012-03-01 NOTE — Progress Notes (Signed)
[facility]  [phone] [phone]   OFFICE PROGRESS NOTE   Cc:  KHAN,JABER A, MD  DIAGNOSIS: stage II classical Hodgkins' lymphoma; mixed cellularity type, (at least 3 different areas of disease in bilateral neck).   PAST THERAPY: Biopsy only   CURRENT THERAPY: started on 11/24/2011 ABVD d1, 15 every 4 weeks with Neulasta the day after. Goal is for 4 cycles total followed by consolidative radiation  INTERVAL HISTORY: Jon Shannon 46 y.o. male returns for regular follow up with his mother for the last planned dose of chemo today.  He has mild fatigue today.  After last dose of chemo, he was out of work for about 4 days.  He has mild numbness and tingling of his fingers which does not interfere with his activities of daily living.  He does not want to decrease any dose of chemo despite the neuropathy.  He denied fever, mucositis, nausea/vomiting, bleeding, SOB, chest pain, wheezing, bone pain.  He can no longer palpate his lymphoma.  He drop a heavy object on his right big toe.  The nail came out.  He was given a course of Doxycycline with some improvement of the erythema and discharge.  However, he is using peroxide and alcohol on this area every day.   Patient denies fever, anorexia, weight loss, headache, visual changes, confusion, drenching night sweats, palpable lymph node swelling, mucositis, odynophagia, dysphagia, nausea vomiting, jaundice, chest pain, palpitation, shortness of breath, dyspnea on exertion, productive cough, gum bleeding, epistaxis, hematemesis, hemoptysis, abdominal pain, abdominal swelling, early satiety, melena, hematochezia, hematuria, skin rash, spontaneous bleeding, joint swelling, joint pain, heat or cold intolerance, bowel bladder incontinence, back pain, focal motor weakness, depression, suicidal or homicidal ideation, feeling hopelessness.     Past Medical History  Diagnosis Date  . Hyperlipidemia 08/07/2011  . Hodgkin's  lymphoma 11/09/2011  . HIV INFECTION 11/30/2009    no history of opportunistic infection  . HYPERTENSION 11/30/2009  . Hypertriglyceridemia   . PTSD (post-traumatic stress disorder)   . DJD (degenerative joint disease)   . Hepatitis A     resolved.     Past Surgical History  Procedure Date  . Tonsillectomy   . Left cervical node excisional biopsy 10/2011  . Left index finger trauma repair     Current Outpatient Prescriptions  Medication Sig Dispense Refill  . bisoprolol-hydrochlorothiazide (ZIAC) 5-6.25 MG per tablet Take 1 tablet by mouth every morning.       . Cholecalciferol (VITAMIN D3) 1000 UNITS tablet Take 1,000 Units by mouth daily.        . diphenhydrAMINE (BENADRYL) 25 MG tablet Take 25 mg by mouth at bedtime as needed. For sleep      . escitalopram (LEXAPRO) 10 MG tablet Take 10 mg by mouth daily.        Marland Kitchen GARLIC OIL PO Take 1 tablet by mouth daily.      Marland Kitchen gemfibrozil (LOPID) 600 MG tablet Take 600 mg by mouth 2 (two) times daily.      Marland Kitchen HYDROcodone-acetaminophen (NORCO) 5-325 MG per tablet Take 1 tablet by mouth every 6 (six) hours as needed. For pain      . lidocaine-prilocaine (EMLA) cream Apply to [facility] site one hour prior to needle sticks as needed to numb area.  30 g  2  . loratadine (CLARITIN) 10 MG tablet Take 10 mg by mouth daily.        Marland Kitchen LORazepam (ATIVAN) 0.5 MG tablet Take 1 tablet (0.5  mg total) by mouth every 6 (six) hours as needed (Anticipation Nausea or vomiting).  30 tablet  0  . ondansetron (ZOFRAN) 8 MG tablet Take 1 tab two times a day starting the day after chemo for 3 days. Then take 1 tab two times a day as needed for nausea or vomiting.  30 tablet  0  . prochlorperazine (COMPAZINE) 10 MG tablet Take 1 tablet (10 mg total) by mouth every 6 (six) hours as needed (Nausea or vomiting).  30 tablet  1  . raltegravir (ISENTRESS) 400 MG tablet Take 400 mg by mouth 2 (two) times daily.      . TRUVADA 200-300 MG per tablet TAKE 1 TABLET BY MOUTH DAILY.  30  tablet  6  . vitamin C (ASCORBIC ACID) 500 MG tablet Take 1,000 mg by mouth daily.      . metFORMIN (GLUCOPHAGE) 500 MG tablet Take by mouth Twice daily.       No current facility-administered medications for this visit.   Facility-Administered Medications Ordered in Other Visits  Medication Dose Route Frequency Provider Last Rate Last Dose  . 0.9 %  sodium chloride infusion   Intravenous Once Exie Parody, MD      . bleomycin (BLEOCIN) 27 Units in sodium chloride 0.9 % 50 mL chemo infusion  10 Units/m2 (Treatment Plan Actual) Intravenous Once Exie Parody, MD   27 Units at 03/01/12 1242  . dacarbazine (DTIC) 1,000 mg in sodium chloride 0.9 % 250 mL chemo infusion  375 mg/m2 (Treatment Plan Actual) Intravenous Once Exie Parody, MD   1,000 mg at 03/01/12 1306  . dexamethasone (DECADRON) injection 20 mg  20 mg Intravenous Once Exie Parody, MD   20 mg at 03/01/12 1133  . DOXOrubicin (ADRIAMYCIN) chemo injection 66 mg  25 mg/m2 (Treatment Plan Actual) Intravenous Once Exie Parody, MD   66 mg at 03/01/12 1227  . heparin lock flush 100 unit/mL  500 Units Intracatheter Once PRN Exie Parody, MD   500 Units at 03/01/12 1415  . ondansetron (ZOFRAN) IVPB 16 mg  16 mg Intravenous Once Exie Parody, MD   16 mg at 03/01/12 1133  . sodium chloride 0.9 % injection 10 mL  10 mL Intracatheter PRN Exie Parody, MD   10 mL at 03/01/12 1415  . vinBLAStine (VELBAN) chemo injection 16 mg  6 mg/m2 (Treatment Plan Actual) Intravenous Once Exie Parody, MD   16 mg at 03/01/12 1237    ALLERGIES:  is allergic to shrimp.  REVIEW OF SYSTEMS:  The rest of the 14-point review of system was negative.   Filed Vitals:   03/01/12 1006  BP: 129/88  Pulse: 66  Temp: 97.6 F (36.4 C)  Resp: 20   Wt Readings from Last 3 Encounters:  03/01/12 283 lb 9.6 oz (128.64 kg)  02/16/12 283 lb 14.4 oz (128.776 kg)  02/05/12 283 lb (128.368 kg)   ECOG Performance status: 1  PHYSICAL EXAMINATION:   General: Obese male in no acute distress. Eyes: no  scleral icterus. ENT: There were no oropharyngeal lesions. Neck was without thyromegaly. Lymphatics: Negative cervical, supraclavicular or axillary adenopathy. Left cervical neck node excisional scar has healed without any erythema, purulent discharge or pain on palpation. I could not appreciate any definite cervical adenopathy. Respiratory: lungs were clear bilaterally without wheezing or crackles. Cardiovascular: Regular rate and rhythm, S1/S2, without murmur, rub or gallop. There was no pedal edema. GI: abdomen was soft, flat, nontender,  nondistended, without organomegaly. Muscoloskeletal: no spinal tenderness of palpation of vertebral spine. Skin exam was without echymosis, petichae. Neuro exam was nonfocal. Patient was able to get on and off exam table without assistance. Gait was normal. Patient was alerted and oriented. Attention was good. Language was appropriate. Mood was normal without depression. Speech was not pressured. Thought content was not tangential.  The nail over the left big toe was missing.  The nail bed appeared pink with granulation tissue.  There was no purulent discharge.   LABORATORY/RADIOLOGY DATA:  Lab Results  Component Value Date   WBC 6.8 03/01/2012   HGB 12.0* 03/01/2012   HCT 36.3* 03/01/2012   PLT 203 03/01/2012   GLUCOSE 157* 02/16/2012   CHOL 136 01/22/2012   TRIG 563* 01/22/2012   HDL 20* 01/22/2012   LDLCALC Comment:   Not calculated due to Triglyceride >400. Suggest ordering Direct LDL (Unit Code: 46962).   Total Cholesterol/HDL Ratio:CHD Risk                        Coronary Heart Disease Risk Table                                        Men       Women          1/2 Average Risk              3.4        3.3              Average Risk              5.0        4.4           2X Average Risk              9.6        7.1           3X Average Risk             23.4       11.0 Use the calculated Patient Ratio above and the CHD Risk table  to determine the patient's CHD Risk. ATP III  Classification (LDL):       < 100        mg/dL         Optimal      952 - 129     mg/dL         Near or Above Optimal      130 - 159     mg/dL         Borderline High      160 - 189     mg/dL         High       > 841        mg/dL         Very High   10/21/4008   ALKPHOS 104 02/16/2012   ALT 46 02/16/2012   AST 62* 02/16/2012   NA 136 02/16/2012   K 4.0 02/16/2012   CL 101 02/16/2012   CREATININE 0.84 02/16/2012   BUN 18 02/16/2012   CO2 26 02/16/2012   INR 0.98 11/20/2011    ASSESSMENT AND PLAN:    1. HIV: He reports that he is compliant with Truvada and Isentress.   2. Hyperlipidemia: He  is on gemfibrozil.   3. PTSD: He is on Lexapro with his psychiatrist. His mood while going through chemo has been stable.   4. Hypertension: Good control with Ziac.   5. Newly diagnosed left cervical neck node classical Hodgkins' lymphoma: Stage II, low risk, nonbulky. There was only slightly FDG uptake in the abdomen without definitive adenopathy.   - He is doing well on chemo ABVD.  He has grade 1 fatigue, grade 1 neuropathy.  He would like to proceed with the last dose of chemo today without reduction.  I ordered PET scan to be performed in about 2 weeks to access response.  I referred him to Rad Onc for evaluation for the rule of consolidative radiation.   6. Left big toe:  No evidence of infection.  There was good granulation tissue formation.  I advised him not to use H2O2 and rubbing alcohol to allow the granulation tissue to improve.  I advised him to use clean water and antibiotic ointment.

## 2012-03-01 NOTE — Telephone Encounter (Signed)
appts made and printed for pt aom °

## 2012-03-02 ENCOUNTER — Ambulatory Visit (HOSPITAL_BASED_OUTPATIENT_CLINIC_OR_DEPARTMENT_OTHER): Payer: 59

## 2012-03-02 VITALS — BP 110/71 | HR 77 | Temp 98.1°F | Resp 20

## 2012-03-02 DIAGNOSIS — C8121 Mixed cellularity classical Hodgkin lymphoma, lymph nodes of head, face, and neck: Secondary | ICD-10-CM

## 2012-03-02 DIAGNOSIS — C8190 Hodgkin lymphoma, unspecified, unspecified site: Secondary | ICD-10-CM

## 2012-03-02 DIAGNOSIS — Z5189 Encounter for other specified aftercare: Secondary | ICD-10-CM

## 2012-03-02 MED ORDER — PEGFILGRASTIM INJECTION 6 MG/0.6ML
6.0000 mg | Freq: Once | SUBCUTANEOUS | Status: AC
  Administered 2012-03-02: 6 mg via SUBCUTANEOUS

## 2012-03-07 ENCOUNTER — Telehealth: Payer: Self-pay | Admitting: *Deleted

## 2012-03-07 NOTE — Telephone Encounter (Signed)
Pt left VM has chemo appt still scheduled for 8/16.  Says he thought he had his last chemo on 8/02?

## 2012-03-07 NOTE — Telephone Encounter (Signed)
No Chemo on 8/16.  I will see him on 03/19/12 to decide what to do.  Thanks.

## 2012-03-07 NOTE — Telephone Encounter (Signed)
Called pt and informed that chemo appts will be canceled.  He has PET on 8/19 and sees Dr. Gaylyn Rong on 8/20.   Pt verbalized understanding.  Sent POF to cancel chemo appts.

## 2012-03-15 ENCOUNTER — Ambulatory Visit: Payer: 59

## 2012-03-16 ENCOUNTER — Ambulatory Visit: Payer: 59

## 2012-03-18 ENCOUNTER — Other Ambulatory Visit (HOSPITAL_BASED_OUTPATIENT_CLINIC_OR_DEPARTMENT_OTHER): Payer: 59

## 2012-03-18 ENCOUNTER — Encounter (HOSPITAL_COMMUNITY): Payer: Self-pay

## 2012-03-18 ENCOUNTER — Encounter (HOSPITAL_COMMUNITY)
Admission: RE | Admit: 2012-03-18 | Discharge: 2012-03-18 | Disposition: A | Discharge status: Home / Self Care | Payer: 59 | Source: Ambulatory Visit | Attending: Oncology | Admitting: Oncology

## 2012-03-18 DIAGNOSIS — C8190 Hodgkin lymphoma, unspecified, unspecified site: Secondary | ICD-10-CM | POA: Insufficient documentation

## 2012-03-18 DIAGNOSIS — C8121 Mixed cellularity classical Hodgkin lymphoma, lymph nodes of head, face, and neck: Secondary | ICD-10-CM

## 2012-03-18 DIAGNOSIS — R599 Enlarged lymph nodes, unspecified: Secondary | ICD-10-CM | POA: Insufficient documentation

## 2012-03-18 LAB — COMPREHENSIVE METABOLIC PANEL
ALT: 55 U/L — ABNORMAL HIGH (ref 0–53)
AST: 52 U/L — ABNORMAL HIGH (ref 0–37)
Alkaline Phosphatase: 76 U/L (ref 39–117)
BUN: 14 mg/dL (ref 6–23)
Chloride: 100 mEq/L (ref 96–112)
Creatinine, Ser: 0.9 mg/dL (ref 0.50–1.35)
Total Bilirubin: 0.4 mg/dL (ref 0.3–1.2)

## 2012-03-18 LAB — CBC WITH DIFFERENTIAL/PLATELET
BASO%: 0.3 % (ref 0.0–2.0)
EOS%: 1.2 % (ref 0.0–7.0)
LYMPH%: 17.3 % (ref 14.0–49.0)
MCH: 29.9 pg (ref 27.2–33.4)
MCHC: 32.9 g/dL (ref 32.0–36.0)
MONO#: 0.7 10*3/uL (ref 0.1–0.9)
MONO%: 7.9 % (ref 0.0–14.0)
Platelets: 190 10*3/uL (ref 140–400)
RBC: 4.12 10*6/uL — ABNORMAL LOW (ref 4.20–5.82)
WBC: 9.3 10*3/uL (ref 4.0–10.3)
nRBC: 0 % (ref 0–0)

## 2012-03-18 MED ORDER — FLUDEOXYGLUCOSE F - 18 (FDG) INJECTION
19.3000 | Freq: Once | INTRAVENOUS | Status: AC | PRN
  Administered 2012-03-18: 19.3 via INTRAVENOUS

## 2012-03-19 ENCOUNTER — Other Ambulatory Visit: Payer: Self-pay | Admitting: Radiology

## 2012-03-19 ENCOUNTER — Other Ambulatory Visit: Payer: Self-pay | Admitting: Licensed Clinical Social Worker

## 2012-03-19 ENCOUNTER — Encounter: Payer: Self-pay | Admitting: Oncology

## 2012-03-19 ENCOUNTER — Telehealth: Payer: Self-pay | Admitting: Oncology

## 2012-03-19 ENCOUNTER — Ambulatory Visit (HOSPITAL_BASED_OUTPATIENT_CLINIC_OR_DEPARTMENT_OTHER): Payer: 59 | Admitting: Oncology

## 2012-03-19 VITALS — BP 134/88 | HR 73 | Temp 97.1°F | Resp 18 | Ht 72.0 in | Wt 280.7 lb

## 2012-03-19 DIAGNOSIS — F4310 Post-traumatic stress disorder, unspecified: Secondary | ICD-10-CM

## 2012-03-19 DIAGNOSIS — B20 Human immunodeficiency virus [HIV] disease: Secondary | ICD-10-CM

## 2012-03-19 DIAGNOSIS — C8121 Mixed cellularity classical Hodgkin lymphoma, lymph nodes of head, face, and neck: Secondary | ICD-10-CM

## 2012-03-19 DIAGNOSIS — C8190 Hodgkin lymphoma, unspecified, unspecified site: Secondary | ICD-10-CM

## 2012-03-19 DIAGNOSIS — R7309 Other abnormal glucose: Secondary | ICD-10-CM

## 2012-03-19 MED ORDER — RALTEGRAVIR POTASSIUM 400 MG PO TABS: 400.0000 mg | ORAL_TABLET | Freq: Two times a day (BID) | ORAL | Status: DC

## 2012-03-19 MED ORDER — EMTRICITABINE-TENOFOVIR DF 200-300 MG PO TABS: 1.0000 | ORAL_TABLET | Freq: Every day | ORAL | Status: DC

## 2012-03-19 NOTE — Treatment Plan (Signed)
[facility] END OF TREATMENT   Name: Jon Shannon Date: 03/19/2012 MRN: [id number] DOB: 07/07/1966   TREATMENT DATES:   11/24/2011 to 03/01/2012.    REFERRING PHYSICIAN: Dr. Christia Reading, M.D.   DIAGNOSIS: classical Hodgkins' lymphoma; mixed cellularity type   STAGE AT START OF TREATMENT:  II   INTENT:Curative   DRUGS OR REGIMENS GIVEN:  ABVD x 4 cycles    MAJOR TOXICITIES:  Grade 1 fatigue, grade 1-2 neuropathy.   REASON TREATMENT STOPPED:  Finish of planned treatment.    PERFORMANCE STATUS AT END:  ECOG 0-1   ONGOING PROBLEMS: Grade 1 fatigue, grade 1-2 neuropathy.   FOLLOW UP PLANS:  Evaluation by Rad Onc for consolidative radiation.

## 2012-03-19 NOTE — Telephone Encounter (Signed)
Gave pt appt for November 2013 lab and MD , pt will have portacath remove 8/22 12noon, pt aware

## 2012-03-19 NOTE — Patient Instructions (Addendum)
A.  PET scan result 03/18/12:  Comparison: 01/16/2012   Findings:   Neck: No hypermetabolic lymph nodes in the neck.  Chest: No hypermetabolic mediastinal or hilar nodes.  No suspicious pulmonary nodules on the CT scan.  Right chest port.  Abdomen/Pelvis: No abnormal hypermetabolic activity within the  liver, pancreas, adrenal glands, or spleen. Severe hepatic  steatosis.  Borderline enlarged retroperitoneal/bilateral iliac chain nodes  measuring up to 12 mm short axis (series 2/image 162), unchanged,  non-FDG-avid. No hypermetabolic lymph nodes in the abdomen or  pelvis.  Skeleton: Heterogeneous uptake within the thoracolumbar spine,  likely reactive. No focal hypermetabolic activity to suggest  skeletal metastasis.   IMPRESSION:   No evidence of metabolically active lymphoma in the neck, chest,  abdomen, or pelvis.   Stable borderline enlarged retroperitoneal lymph nodes measuring up  to 12 mm short axis.  B.  Next step:  Meet with radiation oncologist to discuss pro and con of consolidative radiation.  C.  Follow up:  With Medical Oncology in about 3 months.  Next CT scan in about 6 months.   D.  Diabetes mellitus:  Poorly controlled.  Please discuss with your primary care physician on further management.

## 2012-03-19 NOTE — Progress Notes (Signed)
[facility]  [phone] [phone]   OFFICE PROGRESS NOTE   Cc:  KHAN,JABER A, MD  DIAGNOSIS: stage II classical Hodgkins' lymphoma; mixed cellularity type, (at least 3 different areas of disease in bilateral neck).   PAST THERAPY: started on 11/24/2011 ABVD d1, 15 every 4 weeks with Neulasta the day after. His last dose of chemo was administered on 03/01/2012.   CURRENT THERAPY: pending evaluation by Rad Onc for consolidative radiation.   INTERVAL HISTORY: Jon Shannon 46 y.o. male returns to clinic today with his mother and sister to go over the restaging PET scan.  He reported that he still has mild neuropathy in the finger tips.  He does not have problem with holding utensils or typing or writing.  He does not want any medication for neuropathy.  He still has fatigue.  He is now back to work full time.  He still has bad insomnia that has been going on for years before cancer diagnosis.  He has had dyspepsia, and he has gone off of Metformin thinking that it was the causative agent.  He still has intermittent night sweat and heat sensitivity.  He has intermittent dry cough without SOB, chest pain, hemoptysis, DOE, pedal edema, PND.   Patient denies fever, anorexia, weight loss, headache, visual changes, confusion, drenching night sweats, palpable lymph node swelling, mucositis, odynophagia, dysphagia, nausea vomiting, jaundice, gum bleeding, epistaxis, hematemesis, abdominal pain, abdominal swelling, early satiety, melena, hematochezia, hematuria, skin rash, spontaneous bleeding, joint swelling, joint pain, bowel bladder incontinence, back pain, focal motor weakness, depression, suicidal or homicidal ideation, feeling hopelessness.   Past Medical History  Diagnosis Date  . Hyperlipidemia 08/07/2011  . Hodgkin's lymphoma 11/09/2011  . HIV INFECTION 11/30/2009    no history of opportunistic infection  . HYPERTENSION 11/30/2009  . Hypertriglyceridemia   .  PTSD (post-traumatic stress disorder)   . DJD (degenerative joint disease)   . Hepatitis A     resolved.     Past Surgical History  Procedure Date  . Tonsillectomy   . Left cervical node excisional biopsy 10/2011  . Left index finger trauma repair     Current Outpatient Prescriptions  Medication Sig Dispense Refill  . bisoprolol-hydrochlorothiazide (ZIAC) 5-6.25 MG per tablet Take 1 tablet by mouth every morning.       . Cholecalciferol (VITAMIN D3) 1000 UNITS tablet Take 1,000 Units by mouth daily.        . diphenhydrAMINE (BENADRYL) 25 MG tablet Take 25 mg by mouth at bedtime as needed. For sleep      . escitalopram (LEXAPRO) 10 MG tablet Take 10 mg by mouth daily.        Marland Kitchen GARLIC OIL PO Take 1 tablet by mouth daily.      Marland Kitchen gemfibrozil (LOPID) 600 MG tablet Take 600 mg by mouth 2 (two) times daily.      Marland Kitchen HYDROcodone-acetaminophen (NORCO) 5-325 MG per tablet Take 1 tablet by mouth every 6 (six) hours as needed. For pain      . lidocaine-prilocaine (EMLA) cream Apply to [facility] site one hour prior to needle sticks as needed to numb area.  30 g  2  . loratadine (CLARITIN) 10 MG tablet Take 10 mg by mouth daily.        Marland Kitchen LORazepam (ATIVAN) 0.5 MG tablet Take 1 tablet (0.5 mg total) by mouth every 6 (six) hours as needed (Anticipation Nausea or vomiting).  30 tablet  0  . ondansetron (ZOFRAN) 8  MG tablet Take 1 tab two times a day starting the day after chemo for 3 days. Then take 1 tab two times a day as needed for nausea or vomiting.  30 tablet  0  . prochlorperazine (COMPAZINE) 10 MG tablet Take 1 tablet (10 mg total) by mouth every 6 (six) hours as needed (Nausea or vomiting).  30 tablet  1  . vitamin C (ASCORBIC ACID) 500 MG tablet Take 1,000 mg by mouth daily.      Marland Kitchen emtricitabine-tenofovir (TRUVADA) 200-300 MG per tablet Take 1 tablet by mouth daily.  30 tablet  6  . metFORMIN (GLUCOPHAGE) 500 MG tablet Take by mouth Twice daily.      . raltegravir (ISENTRESS) 400 MG tablet Take  1 tablet (400 mg total) by mouth 2 (two) times daily.  60 tablet  660    ALLERGIES:  is allergic to shrimp.  REVIEW OF SYSTEMS:  The rest of the 14-point review of system was negative.   Filed Vitals:   03/19/12 0823  BP: 134/88  Pulse: 73  Temp: 97.1 F (36.2 C)  Resp: 18   Wt Readings from Last 3 Encounters:  03/19/12 280 lb 11.2 oz (127.325 kg)  03/01/12 283 lb 9.6 oz (128.64 kg)  02/16/12 283 lb 14.4 oz (128.776 kg)   ECOG Performance status: 0-1  PHYSICAL EXAMINATION:  General: Obese male in no acute distress. Eyes: no scleral icterus. ENT: There were no oropharyngeal lesions. Neck was without thyromegaly. Lymphatics: Negative cervical, supraclavicular or axillary adenopathy. Left cervical neck node excisional scar has healed without any erythema, purulent discharge or pain on palpation. I could not appreciate any definite cervical adenopathy. Respiratory: lungs were clear bilaterally without wheezing or crackles. Cardiovascular: Regular rate and rhythm, S1/S2, without murmur, rub or gallop. There was no pedal edema. GI: abdomen was soft, flat, nontender, nondistended, without organomegaly. Muscoloskeletal: no spinal tenderness of palpation of vertebral spine. Skin exam was without echymosis, petichae. Neuro exam was nonfocal. Patient was able to get on and off exam table without assistance. Gait was normal. Patient was alerted and oriented. Attention was good. Language was appropriate. Mood was normal without depression. Speech was not pressured. Thought content was not tangential. The nail over the left big toe was missing. The nail bed appeared pink with granulation tissue. There was no purulent discharge.    LABORATORY/RADIOLOGY DATA:  Lab Results  Component Value Date   WBC 9.3 03/18/2012   HGB 12.3* 03/18/2012   HCT 37.4* 03/18/2012   PLT 190 03/18/2012   GLUCOSE 233* 03/18/2012   CHOL 136 01/22/2012   TRIG 563* 01/22/2012   HDL 20* 01/22/2012   LDLCALC Comment:   Not  calculated due to Triglyceride >400. Suggest ordering Direct LDL (Unit Code: 96045).   Total Cholesterol/HDL Ratio:CHD Risk                        Coronary Heart Disease Risk Table                                        Men       Women          1/2 Average Risk              3.4        3.3  Average Risk              5.0        4.4           2X Average Risk              9.6        7.1           3X Average Risk             23.4       11.0 Use the calculated Patient Ratio above and the CHD Risk table  to determine the patient's CHD Risk. ATP III Classification (LDL):       < 100        mg/dL         Optimal      829 - 129     mg/dL         Near or Above Optimal      130 - 159     mg/dL         Borderline High      160 - 189     mg/dL         High       > 562        mg/dL         Very High   08/30/8655   ALKPHOS 76 03/18/2012   ALT 55* 03/18/2012   AST 52* 03/18/2012   NA 139 03/18/2012   K 4.6 03/18/2012   CL 100 03/18/2012   CREATININE 0.90 03/18/2012   BUN 14 03/18/2012   CO2 27 03/18/2012   INR 0.98 11/20/2011    IMAGING:  I personally reviewed the following PET scan and showed the images to the patient and his relatives.   Nm Pet Image Restag (ps) Skull Base To Thigh  03/18/2012  *RADIOLOGY REPORT*  Clinical Data: Subsequent treatment strategy for Hodgkin's lymphoma. Chemotherapy complete.  NUCLEAR MEDICINE PET SKULL BASE TO THIGH  Fasting Blood Glucose:  279  Technique:  19.3 mCi F-18 FDG was injected intravenously. CT data was obtained and used for attenuation correction and anatomic localization only.  (This was not acquired as a diagnostic CT examination.) Additional exam technical data entered on technologist worksheet.  Comparison:  01/16/2012  Findings:  Neck: No hypermetabolic lymph nodes in the neck.  Chest:  No hypermetabolic mediastinal or hilar nodes.  No suspicious pulmonary nodules on the CT scan.  Right chest port.  Abdomen/Pelvis:  No abnormal hypermetabolic activity within the  liver, pancreas, adrenal glands, or spleen.  Severe hepatic steatosis.  Borderline enlarged retroperitoneal/bilateral iliac chain nodes measuring up to 12 mm short axis (series 2/image 162), unchanged, non-FDG-avid.  No hypermetabolic lymph nodes in the abdomen or pelvis.  Skeleton:  Heterogeneous uptake within the thoracolumbar spine, likely reactive.  No focal hypermetabolic activity to suggest skeletal metastasis.  IMPRESSION: No evidence of metabolically active lymphoma in the neck, chest, abdomen, or pelvis.  Stable borderline enlarged retroperitoneal lymph nodes measuring up to 12 mm short axis.   Original Report Authenticated By: Charline Bills, M.D. ( 03/18/2012 09:59:15 )    ASSESSMENT AND PLAN:   1. HIV: He is on Truvada and Isentress per ID.  2. Hyperlipidemia: He is on gemfibrozil.  3. PTSD: He is on Lexapro with his psychiatrist. His mood while going through chemo has been stable.  4. Hypertension: Good control with Ziac.  5. Hyperglycemia:  Possibly due to chemo.  Cannot rule out spontaneous onset of diabetes mellitus, type II from his obesity.  He discontinued Metformin thinking that it causes his dyspepsia.  I advised him to follow up with his PCP for advice.  Given his hepatic steatosis, metformin may be a good choice if he can control his dyspepsia.  6. GERD:  He was advised not to take PPI or antacid which can interfere with his HIV meds.  I advised him on behavioral changes such as 3-4 hours between dinner and going to bed; no EtOH/carbonated drinks with dinner; elevated the head of his beds.  If these do not improve his GERD, I advised him to follow up with his ID physician for advise on acceptable meds for GERD while being on HIV meds. 7.  Fatigue:  Possibly from chemo with underlying OSA.  I advised him to see his PCP for a referral to sleep study. 8.  Hepatic steatosis:  Due to obesity.  I advised him on regular work out for weight loss to decrease risk of progression of  steatosis to cirrhosis in the future.  9.  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  He does not want to take Neurontin or Lyrica.  10.  Left cervical neck node classical Hodgkins' lymphoma: Stage II, low risk, nonbulky. There was only slightly FDG uptake in the abdomen without definitive adenopathy or FDG uptake.  - s/p 4 cycles of ABVD with complete radiographic response on restaging PET scan in 02/2012.  He is done with chemo according to randomized trials in low risk, nonbulky stage II Hodgkin's lymphoma and NCCN guideline.  - I advised him to see Rad Onc to discuss pros and cons of consolidative neck irradiation to decrease risk of local recurrence.  - For follow up, he will have a surveillance CT in Feb 2014 unless he has concerning symptoms.  He has follow up for clinical exam in about 3 months.   Patient and his relatives expressed relief of the complete response at this time.  They expressed informed understanding and agreed to proceed with evaluation for consolidative radiation.   The length of time of the face-to-face encounter was 25 minutes. More than 50% of time was spent counseling and coordination of care.

## 2012-03-20 ENCOUNTER — Ambulatory Visit
Admission: RE | Admit: 2012-03-20 | Discharge: 2012-03-20 | Disposition: A | Discharge status: Home / Self Care | Payer: 59 | Source: Ambulatory Visit | Attending: Radiation Oncology | Admitting: Radiation Oncology

## 2012-03-20 ENCOUNTER — Encounter: Payer: Self-pay | Admitting: Radiation Oncology

## 2012-03-20 VITALS — BP 138/91 | HR 77 | Temp 98.6°F | Wt 282.1 lb

## 2012-03-20 DIAGNOSIS — C8190 Hodgkin lymphoma, unspecified, unspecified site: Secondary | ICD-10-CM

## 2012-03-20 DIAGNOSIS — E785 Hyperlipidemia, unspecified: Secondary | ICD-10-CM | POA: Insufficient documentation

## 2012-03-20 DIAGNOSIS — K117 Disturbances of salivary secretion: Secondary | ICD-10-CM | POA: Insufficient documentation

## 2012-03-20 DIAGNOSIS — I10 Essential (primary) hypertension: Secondary | ICD-10-CM | POA: Insufficient documentation

## 2012-03-20 DIAGNOSIS — Z21 Asymptomatic human immunodeficiency virus [HIV] infection status: Secondary | ICD-10-CM | POA: Insufficient documentation

## 2012-03-20 DIAGNOSIS — Z79899 Other long term (current) drug therapy: Secondary | ICD-10-CM | POA: Insufficient documentation

## 2012-03-20 DIAGNOSIS — Z87891 Personal history of nicotine dependence: Secondary | ICD-10-CM | POA: Insufficient documentation

## 2012-03-20 DIAGNOSIS — Z510 Encounter for antineoplastic radiation therapy: Secondary | ICD-10-CM | POA: Insufficient documentation

## 2012-03-20 NOTE — Progress Notes (Signed)
Radiation consultation for stage ll Hodgkins' lymphoma of bilateral neck.Patient completed 8 chemotherapy treatments of ABVD over 16 weeks.Tolerated well except for few episodes of nausea/vomiting and neuropathy of hands.

## 2012-03-20 NOTE — Progress Notes (Signed)
Please see the Nurse Progress Note in the MD Initial Consult Encounter for this patient. 

## 2012-03-20 NOTE — Progress Notes (Addendum)
Radiation Oncology         [phone]) [phone] ________________________________  Initial outpatient Consultation  Name: Jon Shannon MRN: [id number]  Date: 03/20/2012  DOB: 1966-06-07  WJ:XBJY,NWGNF A, MD  Exie Parody, MD   REFERRING PHYSICIAN: Exie Parody, MD  DIAGNOSIS: 46 year old gentleman with stage IIA mixed cellularity Hodgkin's disease presenting in the neck  HISTORY OF PRESENT ILLNESS::Jon Shannon is a 46 y.o. male who initially presented to Dr. Jenne Pane of ENT on 10/20/2011. He described a knot in the left upper neck over the past month or so starting at about the size of an olive and increasing to the size of a chicken egg. Fine needle aspiration was performed in the office demonstrating an atypical lymphoid population which was suspicious for a lymphoproliferative process. Accordingly, patient underwent excisional biopsy on 11/03/2011. Pathology showed classical Hodgkin's lymphoma of mixed cellularity type. Staging CTs of the neck chest abdomen and pelvis on 11/17/2011 confirmed prominent left and right cervical lymph nodes at the upper limits of normal. On the left side, the patient had a 9 mm level III lymph node on the right side he also had a 9 mm level II lymph node. Chest CT showed 2 small pulmonary nodules at the left lung base which appear to be benign. Abdominal imaging showed prominent. Aortic lymph nodes with normal spleen and liver. On 11/20/2011, the patient underwent bone marrow biopsy under CT guidance demonstrating normal cellular marrow with trilineage hematopoiesis PET/CT on 11/24/2011 showed a focus of abnormal hypermetabolic activity at the biopsy site as well as in a single left level II lymph node measuring 11 mm in size with a maximum SUV of 4.0. No other distinct hypermetabolism was noted to suggest Hodgkin's involvement of the right neck or elsewhere. The patient has subsequently received chemotherapy consisting of 4 cycles of ABVD.  Interim a PET-CT imaging on  01/14/2012 as well as 03/18/2012 demonstrated no residual disease or hypermetabolism on either study. The patient has completed chemotherapy and kindly been referred today for consolidative involved field radiotherapy.  PREVIOUS RADIATION THERAPY: No  PAST MEDICAL HISTORY:  has a past medical history of Hyperlipidemia (08/07/2011); Hodgkin's lymphoma (11/09/2011); HIV INFECTION (11/30/2009); HYPERTENSION (11/30/2009); Hypertriglyceridemia; PTSD (post-traumatic stress disorder); DJD (degenerative joint disease); and Hepatitis A.    PAST SURGICAL HISTORY: Past Surgical History  Procedure Date  . Tonsillectomy     as teenager  . Left cervical node excisional biopsy 10/2011  . Left index finger trauma repair   . Powerport     FAMILY HISTORY: family history includes Cancer in his father, maternal aunt, and maternal grandmother; Cancer (age of onset:40) in his mother; Diabetes in his father; Hyperlipidemia in his father and mother; Hypertension in his father and mother; and Seizures in his mother.  SOCIAL HISTORY:  reports that he quit smoking about 8 months ago. His smoking use included Cigarettes. He smoked 1 pack per day. He has never used smokeless tobacco. He reports that he does not drink alcohol or use illicit drugs.  ALLERGIES: Shrimp  MEDICATIONS:  Current Outpatient Prescriptions  Medication Sig Dispense Refill  . bisoprolol-hydrochlorothiazide (ZIAC) 5-6.25 MG per tablet Take 1 tablet by mouth every morning.       . Cholecalciferol (VITAMIN D3) 1000 UNITS tablet Take 1,000 Units by mouth daily.        . diphenhydrAMINE (BENADRYL) 25 MG tablet Take 25 mg by mouth at bedtime as needed. For sleep      . emtricitabine-tenofovir (TRUVADA) 200-300 MG per tablet  Take 1 tablet by mouth daily.  30 tablet  6  . escitalopram (LEXAPRO) 10 MG tablet Take 10 mg by mouth daily.        Marland Kitchen gemfibrozil (LOPID) 600 MG tablet Take 600 mg by mouth 2 (two) times daily.      Marland Kitchen loratadine (CLARITIN) 10 MG tablet  Take 10 mg by mouth daily.        . metFORMIN (GLUCOPHAGE) 500 MG tablet Take by mouth Twice daily.      . ondansetron (ZOFRAN) 8 MG tablet Take 1 tab two times a day starting the day after chemo for 3 days. Then take 1 tab two times a day as needed for nausea or vomiting.  30 tablet  0  . raltegravir (ISENTRESS) 400 MG tablet Take 1 tablet (400 mg total) by mouth 2 (two) times daily.  60 tablet  660  . vitamin C (ASCORBIC ACID) 500 MG tablet Take 1,000 mg by mouth daily.      Marland Kitchen GARLIC OIL PO Take 1 tablet by mouth daily.      Marland Kitchen HYDROcodone-acetaminophen (NORCO) 5-325 MG per tablet Take 1 tablet by mouth every 6 (six) hours as needed. For pain      . lidocaine-prilocaine (EMLA) cream Apply to [facility] site one hour prior to needle sticks as needed to numb area.  30 g  2  . LORazepam (ATIVAN) 0.5 MG tablet Take 1 tablet (0.5 mg total) by mouth every 6 (six) hours as needed (Anticipation Nausea or vomiting).  30 tablet  0  . prochlorperazine (COMPAZINE) 10 MG tablet Take 1 tablet (10 mg total) by mouth every 6 (six) hours as needed (Nausea or vomiting).  30 tablet  1    REVIEW OF SYSTEMS:  A 15 point review of systems is documented in the electronic medical record. This was obtained by the nursing staff. However, I reviewed this with the patient to discuss relevant findings and make appropriate changes.  A comprehensive review of systems was negative.  The patient has had no B. symptoms   PHYSICAL EXAM:  weight is 282 lb 1.6 oz (127.96 kg). His temperature is 98.6 F (37 C). His blood pressure is 138/91 and his pulse is 77. His oxygen saturation is 95%.   The patient is a very nice stout gentleman in no acute distress today. He is alert and oriented. His left neck is notable for a surgical scar correellaattiinngg with exciisional biioopsy  ssite. Subjacent to this in level I, there is pe.rhaps a soft tissue defect  but no palpable lymphadenopathy noted in your neck. Oral cavity is notable for  symmetry walldeyers ring  respiratory effort is unremarkable. Patient is neurologically intact.  LABORATORY DATA:  Lab Results  Component Value Date   WBC 9.3 03/18/2012   HGB 12.3* 03/18/2012   HCT 37.4* 03/18/2012   MCV 90.8 03/18/2012   PLT 190 03/18/2012   Lab Results  Component Value Date   NA 139 03/18/2012   K 4.6 03/18/2012   CL 100 03/18/2012   CO2 27 03/18/2012   Lab Results  Component Value Date   ALT 55* 03/18/2012   AST 52* 03/18/2012   ALKPHOS 76 03/18/2012   BILITOT 0.4 03/18/2012     RADIOGRAPHY: Nm Pet Image Restag (ps) Skull Base To Thigh  03/18/2012  *RADIOLOGY REPORT*  Clinical Data: Subsequent treatment strategy for Hodgkin's lymphoma. Chemotherapy complete.  NUCLEAR MEDICINE PET SKULL BASE TO THIGH  Fasting Blood Glucose:  279  Technique:  19.3 mCi F-18 FDG was injected intravenously. CT data was obtained and used for attenuation correction and anatomic localization only.  (This was not acquired as a diagnostic CT examination.) Additional exam technical data entered on technologist worksheet.  Comparison:  01/16/2012  Findings:  Neck: No hypermetabolic lymph nodes in the neck.  Chest:  No hypermetabolic mediastinal or hilar nodes.  No suspicious pulmonary nodules on the CT scan.  Right chest port.  Abdomen/Pelvis:  No abnormal hypermetabolic activity within the liver, pancreas, adrenal glands, or spleen.  Severe hepatic steatosis.  Borderline enlarged retroperitoneal/bilateral iliac chain nodes measuring up to 12 mm short axis (series 2/image 162), unchanged, non-FDG-avid.  No hypermetabolic lymph nodes in the abdomen or pelvis.  Skeleton:  Heterogeneous uptake within the thoracolumbar spine, likely reactive.  No focal hypermetabolic activity to suggest skeletal metastasis.  IMPRESSION: No evidence of metabolically active lymphoma in the neck, chest, abdomen, or pelvis.  Stable borderline enlarged retroperitoneal lymph nodes measuring up to 12 mm short axis.   Original Report  Authenticated By: Charline Bills, M.D. ( 03/18/2012 09:59:15 )       IMPRESSION: This gentleman is a very nice 46 year old man with stage IIA Hodgkin's disease presenting in the left neck. He did have some prominence of a normal sized lymph node within the right neck on his presenting CT but this appeared to be negative on PET imaging. At this time, consolidative involved site radiotherapy would potentially lower the patient's risk for recurrent disease in an effort to cure with relatively limited morbidity.  PLAN: Today, talked the patient about the findings and workup thus far. Talked with the role of radiation treatment in the management of Hodgkin's disease. Talked about some of the evolution in terms of Hodgkin's management over the past several decades and the rationale for combined modality treatment using 4 cycles of ABVD followed by involved field radiotherapy. We discussed the logistics and delivery of radiotherapy as well as the anticipated acute and late sequelae. The patient is interested in proceeding with radiation treatment and will return to our office later this week to proceed with CT simulation. I would anticipate delivering between 25 and 36 Gy to the neck. The patient's stout neck habitus may necessitate the usage of IMRT to deliver a homogeneous radiation distribution to the target volume without exceeding tolerance to the skin or other nearby critical organs such as the spinal cord parotid gland or larynx.  I spent 60 minutes minutes face to face with the patient and more than 50% of that time was spent in counseling and/or coordination of care.   ------------------------------------------------  Artist Pais. Kathrynn Running, M.D.   ADDENDUM:  I reviewed this patient's imaging studies with my colleagues, and we felt that treatment of the left neck nodes without covering the right side may be acceptable.

## 2012-03-21 ENCOUNTER — Other Ambulatory Visit: Payer: Self-pay | Admitting: Oncology

## 2012-03-21 ENCOUNTER — Ambulatory Visit (HOSPITAL_COMMUNITY)
Admission: RE | Admit: 2012-03-21 | Discharge: 2012-03-21 | Disposition: A | Discharge status: Home / Self Care | Payer: 59 | Source: Ambulatory Visit | Attending: Oncology | Admitting: Oncology

## 2012-03-21 VITALS — BP 140/84 | HR 63 | Temp 98.3°F | Resp 14

## 2012-03-21 DIAGNOSIS — Z452 Encounter for adjustment and management of vascular access device: Secondary | ICD-10-CM | POA: Insufficient documentation

## 2012-03-21 DIAGNOSIS — C8190 Hodgkin lymphoma, unspecified, unspecified site: Secondary | ICD-10-CM | POA: Insufficient documentation

## 2012-03-21 MED ORDER — SODIUM CHLORIDE 0.9 % IV SOLN: Freq: Once | INTRAVENOUS | Status: DC

## 2012-03-21 MED ORDER — MIDAZOLAM HCL 2 MG/2ML IJ SOLN
INTRAMUSCULAR | Status: AC
  Filled 2012-03-21: qty 4

## 2012-03-21 MED ORDER — CEFAZOLIN SODIUM-DEXTROSE 2-3 GM-% IV SOLR
2.0000 g | Freq: Once | INTRAVENOUS | Status: AC
  Administered 2012-03-21: 2 g via INTRAVENOUS
  Filled 2012-03-21: qty 50

## 2012-03-21 MED ORDER — MIDAZOLAM HCL 5 MG/5ML IJ SOLN
INTRAMUSCULAR | Status: AC | PRN
  Administered 2012-03-21: 2 mg via INTRAVENOUS

## 2012-03-21 MED ORDER — FENTANYL CITRATE 0.05 MG/ML IJ SOLN
INTRAMUSCULAR | Status: AC
  Filled 2012-03-21: qty 4

## 2012-03-21 MED ORDER — FENTANYL CITRATE 0.05 MG/ML IJ SOLN
INTRAMUSCULAR | Status: AC | PRN
  Administered 2012-03-21: 100 ug via INTRAVENOUS

## 2012-03-21 MED ORDER — LIDOCAINE HCL 1 % IJ SOLN
INTRAMUSCULAR | Status: AC
  Filled 2012-03-21: qty 20

## 2012-03-21 NOTE — H&P (Signed)
Jon Shannon is an 46 y.o. male.   Chief Complaint: "I'm here to get my port out" HPI: Patient with history of Hodgkin's lymphoma, s/p completion of therapy, who presents today for port a cath removal.  Past Medical History  Diagnosis Date  . Hyperlipidemia 08/07/2011  . Hodgkin's lymphoma 11/09/2011  . HIV INFECTION 11/30/2009    no history of opportunistic infection  . HYPERTENSION 11/30/2009  . Hypertriglyceridemia   . PTSD (post-traumatic stress disorder)   . DJD (degenerative joint disease)   . Hepatitis A     resolved.     Past Surgical History  Procedure Date  . Tonsillectomy     as teenager  . Left cervical node excisional biopsy 10/2011  . Left index finger trauma repair   . Powerport     Family History  Problem Relation Age of Onset  . Cancer Mother 56    breast cancer   . Seizures Mother   . Hypertension Mother   . Hyperlipidemia Mother   . Diabetes Father   . Hypertension Father   . Hyperlipidemia Father   . Cancer Father     brain tumor (unknown type)  . Cancer Maternal Aunt     lung cancer  . Cancer Maternal Grandmother     CLL   Social History:  reports that he quit smoking about 8 months ago. His smoking use included Cigarettes. He smoked 1 pack per day. He has never used smokeless tobacco. He reports that he does not drink alcohol or use illicit drugs.  Allergies:  Allergies  Allergen Reactions  . Shrimp (Shellfish Allergy) Anaphylaxis    Current outpatient prescriptions:B Complex-C (B-COMPLEX WITH VITAMIN C) tablet, Take 1 tablet by mouth daily., Disp: , Rfl: ;  bisoprolol-hydrochlorothiazide (ZIAC) 5-6.25 MG per tablet, Take 1 tablet by mouth every morning. , Disp: , Rfl: ;  Cholecalciferol (VITAMIN D3) 1000 UNITS tablet, Take 1,000 Units by mouth daily.  , Disp: , Rfl: ;  diphenhydrAMINE (BENADRYL) 25 MG tablet, Take 25 mg by mouth at bedtime as needed. For sleep, Disp: , Rfl:  emtricitabine-tenofovir (TRUVADA) 200-300 MG per tablet, Take 1 tablet  by mouth daily., Disp: 30 tablet, Rfl: 6;  escitalopram (LEXAPRO) 10 MG tablet, Take 10 mg by mouth daily.  , Disp: , Rfl: ;  GARLIC OIL PO, Take 1 tablet by mouth daily., Disp: , Rfl: ;  gemfibrozil (LOPID) 600 MG tablet, Take 600 mg by mouth 2 (two) times daily., Disp: , Rfl:  lidocaine-prilocaine (EMLA) cream, Apply to [facility] site one hour prior to needle sticks as needed to numb area., Disp: 30 g, Rfl: 2;  loratadine (CLARITIN) 10 MG tablet, Take 10 mg by mouth daily.  , Disp: , Rfl: ;  LORazepam (ATIVAN) 0.5 MG tablet, Take 1 tablet (0.5 mg total) by mouth every 6 (six) hours as needed (Anticipation Nausea or vomiting)., Disp: 30 tablet, Rfl: 0 prochlorperazine (COMPAZINE) 10 MG tablet, Take 10 mg by mouth every 6 (six) hours as needed. For nausea., Disp: , Rfl: ;  raltegravir (ISENTRESS) 400 MG tablet, Take 1 tablet (400 mg total) by mouth 2 (two) times daily., Disp: 60 tablet, Rfl: 660;  vitamin C (ASCORBIC ACID) 500 MG tablet, Take 1,000 mg by mouth daily., Disp: , Rfl:  DISCONTD: prochlorperazine (COMPAZINE) 10 MG tablet, Take 1 tablet (10 mg total) by mouth every 6 (six) hours as needed (Nausea or vomiting)., Disp: 30 tablet, Rfl: 1 Current facility-administered medications:0.9 %  sodium chloride infusion, , Intravenous, Once,  Robet Leu, PA;  ceFAZolin (ANCEF) IVPB 2 g/50 mL premix, 2 g, Intravenous, Once, Robet Leu, PA  Results for orders placed in visit on 03/18/12  CBC WITH DIFFERENTIAL      Component Value Range   WBC 9.3  4.0 - 10.3 10e3/uL   NEUT# 6.8 (*) 1.5 - 6.5 10e3/uL   HGB 12.3 (*) 13.0 - 17.1 g/dL   HCT 16.1 (*) 09.6 - 04.5 %   Platelets 190  140 - 400 10e3/uL   MCV 90.8  79.3 - 98.0 fL   MCH 29.9  27.2 - 33.4 pg   MCHC 32.9  32.0 - 36.0 g/dL   RBC 4.09 (*) 8.11 - 9.14 10e6/uL   RDW 15.3 (*) 11.0 - 14.6 %   lymph# 1.6  0.9 - 3.3 10e3/uL   MONO# 0.7  0.1 - 0.9 10e3/uL   Eosinophils Absolute 0.1  0.0 - 0.5 10e3/uL   Basophils Absolute 0.0  0.0 - 0.1 10e3/uL     NEUT% 73.3  39.0 - 75.0 %   LYMPH% 17.3  14.0 - 49.0 %   MONO% 7.9  0.0 - 14.0 %   EOS% 1.2  0.0 - 7.0 %   BASO% 0.3  0.0 - 2.0 %   nRBC 0  0 - 0 %  COMPREHENSIVE METABOLIC PANEL      Component Value Range   Sodium 139  135 - 145 mEq/L   Potassium 4.6  3.5 - 5.3 mEq/L   Chloride 100  96 - 112 mEq/L   CO2 27  19 - 32 mEq/L   Glucose, Bld 233 (*) 70 - 99 mg/dL   BUN 14  6 - 23 mg/dL   Creatinine, Ser 7.82  0.50 - 1.35 mg/dL   Total Bilirubin 0.4  0.3 - 1.2 mg/dL   Alkaline Phosphatase 76  39 - 117 U/L   AST 52 (*) 0 - 37 U/L   ALT 55 (*) 0 - 53 U/L   Total Protein 7.3  6.0 - 8.3 g/dL   Albumin 4.5  3.5 - 5.2 g/dL   Calcium 9.2  8.4 - 95.6 mg/dL    Review of Systems  Constitutional: Negative for fever and chills.  Respiratory: Positive for cough. Negative for shortness of breath.   Cardiovascular: Negative for chest pain.  Gastrointestinal: Negative for nausea, vomiting and abdominal pain.  Musculoskeletal: Negative for back pain.  Neurological: Negative for headaches.  Endo/Heme/Allergies: Does not bruise/bleed easily.    Blood pressure 124/77, pulse 70, temperature 98.3 F (36.8 C), SpO2 93.00%. Physical Exam  Constitutional: He is oriented to person, place, and time. He appears well-developed and well-nourished.  Cardiovascular: Normal rate and regular rhythm.   Respiratory: Effort normal and breath sounds normal.  GI: Soft. Bowel sounds are normal. There is no tenderness.  Musculoskeletal: Normal range of motion. He exhibits no edema.  Neurological: He is alert and oriented to person, place, and time.     Assessment/Plan Patient s/p completion of therapy for Hodgkin's lymphoma; plan is for port a cath removal today. Details/risks of above d/w pt with his understanding and consent.  ALLRED,D KEVIN 03/21/2012, 12:46 PM

## 2012-03-21 NOTE — H&P (Signed)
Agree with above.  Will proceed with removal of right IJ port.  Signed,  Sterling Big, MD Vascular & Interventional Radiologist [facility] Radiology

## 2012-03-21 NOTE — Procedures (Signed)
Interventional Radiology Procedure Note  Procedure: Removal of right IJ portacatheter Complications: None Recommendations: - Routine wound care - Keep dry x 24 hrs, may begin showing tomorrow - Do not submerge for 7 days  Signed,  Sterling Big, MD Vascular & Interventional Radiologist [facility] Radiology

## 2012-03-22 ENCOUNTER — Encounter: Payer: Self-pay | Admitting: Radiation Oncology

## 2012-03-22 ENCOUNTER — Ambulatory Visit
Admission: RE | Admit: 2012-03-22 | Discharge: 2012-03-22 | Disposition: A | Discharge status: Home / Self Care | Payer: 59 | Source: Ambulatory Visit | Attending: Radiation Oncology | Admitting: Radiation Oncology

## 2012-03-22 DIAGNOSIS — C8191 Hodgkin lymphoma, unspecified, lymph nodes of head, face, and neck: Secondary | ICD-10-CM | POA: Insufficient documentation

## 2012-03-22 DIAGNOSIS — C8190 Hodgkin lymphoma, unspecified, unspecified site: Secondary | ICD-10-CM

## 2012-03-22 NOTE — Addendum Note (Signed)
Encounter addended by: Oneita Hurt, MD on: 03/22/2012 12:20 PM<BR>     Documentation filed: Notes Section

## 2012-03-22 NOTE — Progress Notes (Signed)
  Radiation Oncology         [phone] ________________________________  Name: Jon Shannon MRN: [id number]  Date: 03/22/2012  DOB: 06-28-1966  SIMULATION AND TREATMENT PLANNING NOTE  DIAGNOSIS:  46 year old gentleman with stage IIA mixed cellularity Hodgkin's disease presenting in the neck  NARRATIVE:  The patient was brought to the CT Simulation planning suite.  Identity was confirmed.  All relevant records and images related to the planned course of therapy were reviewed.  The patient freely provided informed written consent to proceed with treatment after reviewing the details related to the planned course of therapy. The consent form was witnessed and verified by the simulation staff.  Then, the patient was set-up in a stable reproducible  supine position for radiation therapy.  CT images were obtained.  Surface markings were placed.  The CT images were loaded into the planning software.  Then the target and avoidance structures were contoured.  Treatment planning then occurred.  The radiation prescription was entered and confirmed.  A total of 1 complex treatment device was fabricated. I have requested : IMRT due to the proximity of the targets to the parotid, spinal cord and larynx.  I have ordered:Nutrition Consult  PLAN:  The patient will receive 30.6 Gy in 18 fraction in the form of involved site radiotherapy (ISRT).  ________________________________  Artist Pais Kathrynn Running, M.D.

## 2012-03-22 NOTE — Addendum Note (Signed)
Encounter addended by: Shaindel Sweeten Mintz Alyannah Sanks, RN on: 03/22/2012  6:56 PM<BR>     Documentation filed: Charges VN

## 2012-03-28 ENCOUNTER — Encounter: Payer: Self-pay | Admitting: *Deleted

## 2012-03-28 NOTE — Progress Notes (Signed)
[facility] Psychosocial Distress Screening Clinical Social Work  Clinical Social Work was referred by distress screening protocol.  The patient scored a 5 on the Psychosocial Distress Thermometer which indicates moderate distress. Clinical Social Worker contacted the patient to assess for distress and other psychosocial needs. Mr. Brandis states he feels well and is attempting to work as much as possible.  He states he is somewhat upset with the cancer center in scheduling treatments during his work schedule.  We discussed how the center is doing everything possible to accommodate his schedule.  The patient verbalized understanding and plans to also be as flexible as possible.  He states he feels he is coping adequately through utilizing humor and keeping a routine.  He feels his mood is stabilized by taking lexapro.  CSW discussed coping skills for insomnia and patient is interested in utilizing [facility] meditation CD.  CSW will leave CD with radonc staff and patient plans to call with additional questions or concerns. CSW will also contact Zenovia Jarred, RD, for patient's nutrition questions.   Clinical Social Worker follow up needed: no  If yes, follow up plan:  Kathrin Penner, MSW, [facility] Clinical Social Worker [facility] [phone]

## 2012-03-29 ENCOUNTER — Ambulatory Visit: Payer: 59

## 2012-03-30 ENCOUNTER — Ambulatory Visit: Payer: 59

## 2012-04-02 ENCOUNTER — Ambulatory Visit: Payer: 59 | Admitting: Nutrition

## 2012-04-02 NOTE — Assessment & Plan Note (Signed)
I received a request from Kathrin Penner, our social worker, to contact [name] because he had questions regarding nutrition.  This is a 46 year old male patient of Dr. Gaylyn Rong and Dr. Kathrynn Running diagnosed with stage II Hodgkin's lymphoma.  The patient reports he has completed his chemotherapy and is pending radiation therapy.  He has concerns about eating at buffets, eating raw food such as sushi and he has questions about drinking aloe vera juice for potential sore throat after radiation starts.   NUTRITION DIAGNOSIS:  Food and nutrition related knowledge deficit related to diagnosis of Hodgkin's lymphoma and associated treatments as evidenced by the patient's self report of inadequate and inaccurate information.  INTERVENTION:  I educated the patient on the importance of following safe food handling tips.  I have advised him to be sure to eat only well cooked foods.  I have discouraged the patient from consuming sushi or foods from a salad bar until his physician tells him his white cell count is safe.  I have also given him some brief strategies on managing a sore or irritated throat.  I have advised him not to drink aloe vera juice but to discuss esophagitis with the patient's physician if this indeed becomes a problem.  The patient verbalizes understanding and appreciation for information.  I will mail him fact sheets with my contact information for further questions or concerns.   ______________________________ Zenovia Jarred, RD, CSO, LDN Clinical Nutrition Specialist BN/MEDQ  D:  04/02/2012  T:  04/02/2012  Job:  [id number]

## 2012-04-03 ENCOUNTER — Ambulatory Visit: Payer: 59 | Admitting: Radiation Oncology

## 2012-04-03 ENCOUNTER — Ambulatory Visit
Admission: RE | Admit: 2012-04-03 | Discharge: 2012-04-03 | Disposition: A | Discharge status: Home / Self Care | Payer: 59 | Source: Ambulatory Visit | Attending: Radiation Oncology | Admitting: Radiation Oncology

## 2012-04-03 ENCOUNTER — Telehealth: Payer: Self-pay | Admitting: *Deleted

## 2012-04-03 ENCOUNTER — Ambulatory Visit
Admission: RE | Admit: 2012-04-03 | Payer: 59 | Source: Ambulatory Visit | Attending: Radiation Oncology | Admitting: Radiation Oncology

## 2012-04-03 ENCOUNTER — Ambulatory Visit: Payer: 59

## 2012-04-03 DIAGNOSIS — C8190 Hodgkin lymphoma, unspecified, unspecified site: Secondary | ICD-10-CM

## 2012-04-03 MED ORDER — BIAFINE EX EMUL
Freq: Two times a day (BID) | CUTANEOUS | Status: DC
  Administered 2012-04-03: 15:00:00 via TOPICAL

## 2012-04-03 NOTE — Telephone Encounter (Signed)
Called patient to inform of appt. On 04-12-12 at 9:00 am with the nutritionist Zenovia Jarred, spoke with patient and he is aware of this appt.

## 2012-04-03 NOTE — Addendum Note (Signed)
Encounter addended by: Glennie Hawk, RN on: 04/03/2012  2:51 PM<BR>     Documentation filed: Charges VN, Inpatient MAR, Orders

## 2012-04-03 NOTE — Progress Notes (Signed)
Post sim ed completed w/pt; gave pt "Radiation and You" booklet w/pertinent pages marked. Gave pt Biafine w/instructions for proper use. All questions answered. Dietician appt scheduled for pt.

## 2012-04-04 ENCOUNTER — Ambulatory Visit: Payer: 59

## 2012-04-04 ENCOUNTER — Ambulatory Visit
Admission: RE | Admit: 2012-04-04 | Discharge: 2012-04-04 | Disposition: A | Discharge status: Home / Self Care | Payer: 59 | Source: Ambulatory Visit | Attending: Radiation Oncology | Admitting: Radiation Oncology

## 2012-04-05 ENCOUNTER — Encounter: Payer: Self-pay | Admitting: Radiation Oncology

## 2012-04-05 ENCOUNTER — Ambulatory Visit: Payer: 59

## 2012-04-05 ENCOUNTER — Ambulatory Visit
Admission: RE | Admit: 2012-04-05 | Discharge: 2012-04-05 | Disposition: A | Discharge status: Home / Self Care | Payer: 59 | Source: Ambulatory Visit | Attending: Radiation Oncology | Admitting: Radiation Oncology

## 2012-04-05 VITALS — BP 132/83 | HR 73 | Temp 98.7°F | Resp 20 | Wt 283.4 lb

## 2012-04-05 DIAGNOSIS — C8190 Hodgkin lymphoma, unspecified, unspecified site: Secondary | ICD-10-CM

## 2012-04-05 NOTE — Progress Notes (Signed)
Pt denies loss of appetite, fatigue. He does have pain in fingertips, hands, feet due to neuropathy. No issues w/eating, swallowing, no skin changes at this time.

## 2012-04-05 NOTE — Progress Notes (Signed)
  Radiation Oncology         [phone] ________________________________  Name: Jon Shannon MRN: [id number]  Date: 04/05/2012  DOB: 09/25/1965  Weekly Radiation Therapy Management  Current Dose: 5.1 Gy     Planned Dose:  30.6 Gy  Narrative . . . . . . . . The patient presents for routine under treatment assessment.                                  The patient is without complaint except minor xerostomia.                                 Set-up films were reviewed.                                 The chart was checked. Physical Findings. . . Weight essentially stable.  No significant changes. Impression . . . . . . . The patient is  tolerating radiation. Plan . . . . . . . . . . . . Continue treatment as planned.  ________________________________  Artist Pais. Kathrynn Running, M.D.

## 2012-04-08 ENCOUNTER — Ambulatory Visit: Payer: 59

## 2012-04-08 ENCOUNTER — Ambulatory Visit
Admission: RE | Admit: 2012-04-08 | Discharge: 2012-04-08 | Disposition: A | Discharge status: Home / Self Care | Payer: 59 | Source: Ambulatory Visit | Attending: Radiation Oncology | Admitting: Radiation Oncology

## 2012-04-08 NOTE — Progress Notes (Signed)
Pt c/o fatigue. Per Dr Kathrynn Running, checked pt's CBG = 174.

## 2012-04-09 ENCOUNTER — Ambulatory Visit
Admission: RE | Admit: 2012-04-09 | Discharge: 2012-04-09 | Disposition: A | Discharge status: Home / Self Care | Payer: 59 | Source: Ambulatory Visit | Attending: Radiation Oncology | Admitting: Radiation Oncology

## 2012-04-09 ENCOUNTER — Ambulatory Visit: Payer: 59

## 2012-04-10 ENCOUNTER — Ambulatory Visit: Payer: 59

## 2012-04-10 ENCOUNTER — Ambulatory Visit
Admission: RE | Admit: 2012-04-10 | Discharge: 2012-04-10 | Disposition: A | Discharge status: Home / Self Care | Payer: 59 | Source: Ambulatory Visit | Attending: Radiation Oncology | Admitting: Radiation Oncology

## 2012-04-11 ENCOUNTER — Ambulatory Visit
Admission: RE | Admit: 2012-04-11 | Discharge: 2012-04-11 | Disposition: A | Discharge status: Home / Self Care | Payer: 59 | Source: Ambulatory Visit | Attending: Radiation Oncology | Admitting: Radiation Oncology

## 2012-04-11 ENCOUNTER — Ambulatory Visit: Payer: 59

## 2012-04-12 ENCOUNTER — Encounter: Payer: Self-pay | Admitting: Radiation Oncology

## 2012-04-12 ENCOUNTER — Ambulatory Visit: Payer: 59 | Admitting: Nutrition

## 2012-04-12 ENCOUNTER — Ambulatory Visit
Admission: RE | Admit: 2012-04-12 | Discharge: 2012-04-12 | Disposition: A | Discharge status: Home / Self Care | Payer: 59 | Source: Ambulatory Visit | Attending: Radiation Oncology | Admitting: Radiation Oncology

## 2012-04-12 ENCOUNTER — Ambulatory Visit: Payer: 59

## 2012-04-12 VITALS — BP 122/76 | HR 70 | Temp 98.9°F | Resp 20 | Wt 285.3 lb

## 2012-04-12 DIAGNOSIS — C8190 Hodgkin lymphoma, unspecified, unspecified site: Secondary | ICD-10-CM

## 2012-04-12 DIAGNOSIS — T66XXXA Radiation sickness, unspecified, initial encounter: Secondary | ICD-10-CM

## 2012-04-12 MED ORDER — HYDROCODONE-ACETAMINOPHEN 7.5-500 MG/15ML PO SOLN: 15.0000 mL | Freq: Four times a day (QID) | ORAL | Status: AC | PRN

## 2012-04-12 NOTE — Progress Notes (Signed)
Pt denies pain except at base of skull from positioning for radiation tx which he states is "uncomfortable". Pt fatigued but continues to work full time. Denies loss of appetite, does report dry mouth, scratchy throat one day earlier this week.

## 2012-04-12 NOTE — Assessment & Plan Note (Signed)
Receiving Radiation Completed 8 out of 18 treatments.

## 2012-04-12 NOTE — Progress Notes (Signed)
  Radiation Oncology         [phone] ________________________________  Name: Jon Shannon MRN: [id number]  Date: 04/12/2012  DOB: 11-Dec-1965  Weekly Radiation Therapy Management  Stage IIA Classical Hodgkin's Lymphoma, Mixed Cellularity Receiving Radiation Completed 8 out of 18 treatments.  Current Dose: 13.6 Gy     Planned Dose:  30.6 Gy   Narrative . . . . . . . . The patient presents for routine under treatment assessment.                                                 Pt denies pain except at base of skull from positioning for radiation tx which he states is "uncomfortable". Pt fatigued but continues to work full time. Denies loss of appetite, does report dry mouth, scratchy throat one day earlier this week.                                     The patient is without complaint.                                 Set-up films were reviewed.                                 The chart was checked. Physical Findings. . . Weight essentially stable.  Filed Vitals:   04/12/12 0854  BP: 122/76  Pulse: 70  Temp: 98.9 F (37.2 C)  Resp: 20    No significant changes. Impression . . . . . . . The patient is  tolerating radiation. Plan . . . . . . . . . . . . Continue treatment as planned.  Given Lortab elixir if needed.  ________________________________  Artist Pais Kathrynn Running, M.D.

## 2012-04-13 ENCOUNTER — Ambulatory Visit: Payer: 59

## 2012-04-15 ENCOUNTER — Ambulatory Visit
Admission: RE | Admit: 2012-04-15 | Discharge: 2012-04-15 | Disposition: A | Discharge status: Home / Self Care | Payer: 59 | Source: Ambulatory Visit | Attending: Radiation Oncology | Admitting: Radiation Oncology

## 2012-04-15 ENCOUNTER — Ambulatory Visit: Payer: 59

## 2012-04-15 NOTE — Progress Notes (Signed)
Mr. Niece presented to nutrition followup.  The patient complains of fatigue, reflux, and dry mouth.  His weight is documented as 285.3 pounds today which was relatively stable over the last month.  BMI 38.43 reflects obesity.  The patient has a usual body weight of 300 pounds. The patient has completed chemotherapy.  He is tolerating radiation therapy and had some questions about how to deal with dry mouth.  NUTRITION DIAGNOSIS:  Food and nutrition-related knowledge deficit continues.  INTERVENTION:  I educated the patient on the importance of soft moist foods as needed for sore throat or dry mouth.  I have provided some additional fact sheets for him.  The patient denies intolerance to spicy or acidic foods.  However, I did educate him that these can contribute to reflux.  I also educated the patient on a healthier plant-based diet with more fruits, vegetables, and whole grains, and lean protein sources in an effort for the patient to maintain current weight which is his desire.  I have provided a fact sheet on my contact information.  MONITORING/EVALUATION (GOALS):  The patient will tolerate a healthier plant-based diet to promote preservation of lean body mass.  NEXT VISIT:  The patient will contact me for questions or concerns.   ______________________________ Zenovia Jarred, RD, CSO, LDN Clinical Nutrition Specialist BN/MEDQ  D:  04/12/2012  T:  04/15/2012  Job:  [id number]

## 2012-04-16 ENCOUNTER — Ambulatory Visit: Payer: 59

## 2012-04-16 ENCOUNTER — Ambulatory Visit
Admission: RE | Admit: 2012-04-16 | Discharge: 2012-04-16 | Disposition: A | Discharge status: Home / Self Care | Payer: 59 | Source: Ambulatory Visit | Attending: Radiation Oncology | Admitting: Radiation Oncology

## 2012-04-17 ENCOUNTER — Ambulatory Visit: Payer: 59

## 2012-04-17 ENCOUNTER — Ambulatory Visit
Admission: RE | Admit: 2012-04-17 | Discharge: 2012-04-17 | Disposition: A | Discharge status: Home / Self Care | Payer: 59 | Source: Ambulatory Visit | Attending: Radiation Oncology | Admitting: Radiation Oncology

## 2012-04-18 ENCOUNTER — Ambulatory Visit: Payer: 59

## 2012-04-18 ENCOUNTER — Ambulatory Visit
Admission: RE | Admit: 2012-04-18 | Discharge: 2012-04-18 | Disposition: A | Discharge status: Home / Self Care | Payer: 59 | Source: Ambulatory Visit | Attending: Radiation Oncology | Admitting: Radiation Oncology

## 2012-04-19 ENCOUNTER — Ambulatory Visit
Admission: RE | Admit: 2012-04-19 | Discharge: 2012-04-19 | Disposition: A | Discharge status: Home / Self Care | Payer: 59 | Source: Ambulatory Visit | Attending: Radiation Oncology | Admitting: Radiation Oncology

## 2012-04-19 ENCOUNTER — Ambulatory Visit: Payer: 59

## 2012-04-19 ENCOUNTER — Encounter: Payer: Self-pay | Admitting: Radiation Oncology

## 2012-04-19 VITALS — BP 122/56 | HR 70 | Temp 98.8°F | Resp 20 | Wt 282.0 lb

## 2012-04-19 DIAGNOSIS — C8190 Hodgkin lymphoma, unspecified, unspecified site: Secondary | ICD-10-CM

## 2012-04-19 NOTE — Progress Notes (Signed)
Pt states his throat has become progressively more sore this week, and pain radiates into his left ear. He states he feels he may have L ear infection; put Peroxide in his ear last night, and "it foamed and foamed". He then put alcohol gtts in ear "to dry it out"; ear drained clear liquid. Pt states he will pick up Lortab elixir today. Pt eating soft foods, drinking warm liquids. No evidence of thrush noted in mouth.  Pt has seen nutritionist. Pt fatigued, denies skin irritation.

## 2012-04-20 ENCOUNTER — Encounter: Payer: Self-pay | Admitting: Radiation Oncology

## 2012-04-20 NOTE — Progress Notes (Signed)
  Radiation Oncology         [phone] ________________________________  Name: Jon Shannon MRN: [id number]  Date: 04/19/2012  DOB: 10/06/1965  Weekly Radiation Therapy Management  Current Dose: 22.1 Gy     Planned Dose:  30 Gy  Narrative . . . . . . . . The patient presents for routine under treatment assessment.                                                      The patient is without complaint except sore throat and some associated otalgia with swallowing.                                 Set-up films were reviewed.                                 The chart was checked. Physical Findings. . . Weight essentially stable.  No significant changes.  No significant mucositis Impression . . . . . . . The patient is  tolerating radiation. Plan . . . . . . . . . . . . Continue treatment as planned.   He will fill Rx for Lortab elixir from last week.  ________________________________  Artist Pais. Kathrynn Running, M.D.

## 2012-04-22 ENCOUNTER — Ambulatory Visit
Admission: RE | Admit: 2012-04-22 | Discharge: 2012-04-22 | Disposition: A | Discharge status: Home / Self Care | Payer: 59 | Source: Ambulatory Visit | Attending: Radiation Oncology | Admitting: Radiation Oncology

## 2012-04-22 ENCOUNTER — Ambulatory Visit: Payer: 59

## 2012-04-23 ENCOUNTER — Ambulatory Visit: Payer: 59

## 2012-04-23 ENCOUNTER — Ambulatory Visit
Admission: RE | Admit: 2012-04-23 | Discharge: 2012-04-23 | Disposition: A | Discharge status: Home / Self Care | Payer: 59 | Source: Ambulatory Visit | Attending: Radiation Oncology | Admitting: Radiation Oncology

## 2012-04-24 ENCOUNTER — Ambulatory Visit: Payer: 59

## 2012-04-24 ENCOUNTER — Ambulatory Visit
Admission: RE | Admit: 2012-04-24 | Discharge: 2012-04-24 | Disposition: A | Discharge status: Home / Self Care | Payer: 59 | Source: Ambulatory Visit | Attending: Radiation Oncology | Admitting: Radiation Oncology

## 2012-04-25 ENCOUNTER — Ambulatory Visit: Payer: 59

## 2012-04-26 ENCOUNTER — Ambulatory Visit: Payer: 59

## 2012-04-26 ENCOUNTER — Ambulatory Visit
Admission: RE | Admit: 2012-04-26 | Discharge: 2012-04-26 | Disposition: A | Discharge status: Home / Self Care | Payer: 59 | Source: Ambulatory Visit | Attending: Radiation Oncology | Admitting: Radiation Oncology

## 2012-04-27 ENCOUNTER — Ambulatory Visit: Payer: 59

## 2012-04-29 ENCOUNTER — Ambulatory Visit
Admission: RE | Admit: 2012-04-29 | Discharge: 2012-04-29 | Disposition: A | Discharge status: Home / Self Care | Payer: 59 | Source: Ambulatory Visit | Attending: Radiation Oncology | Admitting: Radiation Oncology

## 2012-04-29 ENCOUNTER — Encounter: Payer: Self-pay | Admitting: Radiation Oncology

## 2012-04-29 VITALS — BP 123/77 | HR 67 | Resp 18 | Wt 276.3 lb

## 2012-04-29 DIAGNOSIS — C8190 Hodgkin lymphoma, unspecified, unspecified site: Secondary | ICD-10-CM

## 2012-04-29 NOTE — Progress Notes (Signed)
Received patient in the clinic today unaccompanied for PUT with Dr. Kathrynn Running. Patient is alert and oriented to person, place, and time. No distress noted. Steady gait noted. Pleasant affect noted. Patient denies pain at this time. Patient denies sore throat. Patient reports taking Lortab elixir just before bed. Patient reports that occasionally when he swallow food get stuck in his throat and he has to chase it with liquid. Patient reports dry mouth and bad breath. Patient reports a metallic taste in his mouth. Patient denies using Biafine cream on treatment area. Reinforced skin care. Slightly hyperpigmented and dry skin of left neck noted. Eight pound weight loss noted in one weeks time. Gave patient appointment card for one month follow up. Reported all findings to Dr. Kathrynn Running.

## 2012-04-29 NOTE — Progress Notes (Signed)
  Radiation Oncology         [phone] ________________________________  Name: Jon Shannon MRN: [id number]  Date: 04/29/2012  DOB: Apr 19, 1966  Weekly Radiation Therapy Management  Current Dose: 28.9 Gy     Planned Dose:  30.6 Gy  Narrative . . . . . . . . The patient presents for the final under treatment assessment.                                           The patient has had some continuation of previously noted symptoms.Patient denies pain at this time. Patient denies sore throat. Patient reports taking Lortab elixir just before bed. Patient reports that occasionally when he swallow food get stuck in his throat and he has to chase it with liquid. Patient reports dry mouth and bad breath. Patient reports a metallic taste in his mouth. Patient denies using Biafine cream on treatment area. Reinforced skin care. Slightly hyperpigmented and dry skin of left neck noted. Eight pound weight loss noted in one weeks time.                                 Set-up films were reviewed.                                 The chart was checked. Physical Findings. Marland Kitchen .Patient is alert and oriented to person, place, and time. No distress noted. Steady gait noted. Pleasant affect noted.  Weight essentially stable.  No significant changes. Impression . . . . . . . The patient tolerated radiation relatively well. Plan . . . . . . . . . . . . Complete radiation today as scheduled, and follow-up in one month. The patient was encouraged to call or return to the clinic in the interim for any worsening symptoms.  ________________________________  Artist Pais Kathrynn Running, M.D.

## 2012-05-01 NOTE — Progress Notes (Signed)
  Radiation Oncology         [phone] ________________________________  Name: Jon Shannon MRN: [id number]  Date: 04/29/2012  DOB: 31-Aug-1965  End of Treatment Note  Diagnosis:  46 year old gentleman with stage IIA mixed cellularity Hodgkin's disease presenting in the neck  Indication for treatment:  Consolidative involved site Radiotherapy following 4 x ABVD, Curative       Radiation treatment dates:   04/03/2012-04/29/2012  Site/dose:   30.6 Gy in 18 fraction in the form of involved site radiotherapy (ISRT) to the   Beams/energy:   6 MV x-rays were used to deliver IMRT with TomoTherapy in a thermoplastic headcast with daily MV-CT image guidance.    Narrative: The patient tolerated radiation treatment relatively well.  He had progressively more sore throat and pain radiating into his left ear with responded to Lortab elixir, eating soft foods, and drinking warm liquids.  He was fatigued.  He denied skin irritation.  Plan: The patient has completed radiation treatment. The patient will return to radiation oncology clinic for routine followup in one month. I advised them to call or return sooner if they have any questions or concerns related to their recovery or treatment. ________________________________  Artist Pais. Kathrynn Running, M.D.

## 2012-05-08 ENCOUNTER — Telehealth: Payer: Self-pay | Admitting: *Deleted

## 2012-05-08 NOTE — Telephone Encounter (Signed)
CALLED PATIENT TO ALTER FU VISIT FOR 05-30-12 DUE TO DR. MANNING BEING IN THE OR, RESCHEDULED FOR 05-30-12 AT 2:15 PM, SPOKE WITH PATIENT AND HE IS GOOD WITH THIS APPT. CHANGE

## 2012-05-24 ENCOUNTER — Encounter: Payer: Self-pay | Admitting: Radiation Oncology

## 2012-05-24 DIAGNOSIS — Z923 Personal history of irradiation: Secondary | ICD-10-CM | POA: Insufficient documentation

## 2012-05-27 ENCOUNTER — Other Ambulatory Visit: Payer: 59

## 2012-05-27 ENCOUNTER — Ambulatory Visit (INDEPENDENT_AMBULATORY_CARE_PROVIDER_SITE_OTHER): Payer: 59

## 2012-05-27 DIAGNOSIS — E781 Pure hyperglyceridemia: Secondary | ICD-10-CM

## 2012-05-27 DIAGNOSIS — B20 Human immunodeficiency virus [HIV] disease: Secondary | ICD-10-CM

## 2012-05-27 DIAGNOSIS — Z23 Encounter for immunization: Secondary | ICD-10-CM

## 2012-05-28 LAB — LIPID PANEL
Cholesterol: 177 mg/dL (ref 0–200)
HDL: 31 mg/dL — ABNORMAL LOW (ref >39)
LDL Cholesterol: 91 mg/dL (ref 0–99)
Triglycerides: 274 mg/dL — ABNORMAL HIGH (ref <150)
VLDL: 55 mg/dL — ABNORMAL HIGH (ref 0–40)

## 2012-05-28 LAB — CBC WITH DIFFERENTIAL/PLATELET
Eosinophils Absolute: 0.1 10*3/uL (ref 0.0–0.7)
Eosinophils Relative: 1 % (ref 0–5)
HCT: 43.6 % (ref 39.0–52.0)
Hemoglobin: 14.8 g/dL (ref 13.0–17.0)
Lymphocytes Relative: 51 % — ABNORMAL HIGH (ref 12–46)
Lymphs Abs: 3.1 10*3/uL (ref 0.7–4.0)
MCH: 29.5 pg (ref 26.0–34.0)
MCV: 87 fL (ref 78.0–100.0)
Monocytes Absolute: 0.4 10*3/uL (ref 0.1–1.0)
Monocytes Relative: 6 % (ref 3–12)
RBC: 5.01 MIL/uL (ref 4.22–5.81)
WBC: 6.2 10*3/uL (ref 4.0–10.5)

## 2012-05-28 LAB — COMPLETE METABOLIC PANEL WITH GFR
ALT: 56 U/L — ABNORMAL HIGH (ref 0–53)
AST: 41 U/L — ABNORMAL HIGH (ref 0–37)
BUN: 15 mg/dL (ref 6–23)
Calcium: 10 mg/dL (ref 8.4–10.5)
Chloride: 102 mEq/L (ref 96–112)
Creat: 0.91 mg/dL (ref 0.50–1.35)
GFR, Est African American: >89 mL/min
Total Bilirubin: 0.4 mg/dL (ref 0.3–1.2)

## 2012-05-29 DIAGNOSIS — M1990 Unspecified osteoarthritis, unspecified site: Secondary | ICD-10-CM | POA: Insufficient documentation

## 2012-05-29 DIAGNOSIS — B159 Hepatitis A without hepatic coma: Secondary | ICD-10-CM | POA: Insufficient documentation

## 2012-05-30 ENCOUNTER — Ambulatory Visit
Admission: RE | Admit: 2012-05-30 | Discharge: 2012-05-30 | Disposition: A | Discharge status: Home / Self Care | Payer: 59 | Source: Ambulatory Visit | Attending: Radiation Oncology | Admitting: Radiation Oncology

## 2012-05-30 ENCOUNTER — Ambulatory Visit: Payer: 59 | Admitting: Radiation Oncology

## 2012-05-30 ENCOUNTER — Encounter: Payer: Self-pay | Admitting: Radiation Oncology

## 2012-05-30 VITALS — BP 115/69 | HR 69 | Temp 98.2°F | Wt 276.4 lb

## 2012-05-30 DIAGNOSIS — C8190 Hodgkin lymphoma, unspecified, unspecified site: Secondary | ICD-10-CM

## 2012-05-30 NOTE — Progress Notes (Signed)
  Radiation Oncology         [phone]) [phone] ________________________________  Name: Jon Shannon MRN: [id number]  Date: 05/30/2012  DOB: [birth year]  Follow-Up Visit Note  CC: Lise Auer, MD  Lise Auer, MD  Diagnosis:   46 year old gentleman with stage IIA mixed cellularity Hodgkin's disease presenting in the neck  Interval Since Last Radiation:  1 months  Narrative:  The patient returns today for routine follow-up.  He is doing well with no ongoing complaints related to radiation.  He has been having headaches intermitantly which respond to tylenol.    ALLERGIES:  is allergic to shrimp.  Meds: Current Outpatient Prescriptions  Medication Sig Dispense Refill  . B Complex-C (B-COMPLEX WITH VITAMIN C) tablet Take 1 tablet by mouth daily.      . bisoprolol-hydrochlorothiazide (ZIAC) 5-6.25 MG per tablet Take 1 tablet by mouth every morning.       . Cholecalciferol (VITAMIN D3) 1000 UNITS tablet Take 1,000 Units by mouth daily.        . diphenhydrAMINE (BENADRYL) 25 MG tablet Take 25 mg by mouth at bedtime as needed. For sleep      . emollient (BIAFINE) cream Apply topically 2 (two) times daily.      Marland Kitchen emtricitabine-tenofovir (TRUVADA) 200-300 MG per tablet Take 1 tablet by mouth daily.  30 tablet  6  . escitalopram (LEXAPRO) 10 MG tablet Take 10 mg by mouth daily.        Marland Kitchen GARLIC OIL PO Take 1 tablet by mouth daily.      Marland Kitchen gemfibrozil (LOPID) 600 MG tablet Take 600 mg by mouth 2 (two) times daily.      Marland Kitchen HYDROcodone-acetaminophen (LORTAB) 7.5-500 MG/15ML solution       . lidocaine-prilocaine (EMLA) cream Apply to [facility] site one hour prior to needle sticks as needed to numb area.  30 g  2  . loratadine (CLARITIN) 10 MG tablet Take 10 mg by mouth daily.        . metFORMIN (GLUCOPHAGE) 500 MG tablet       . ondansetron (ZOFRAN) 8 MG tablet       . prochlorperazine (COMPAZINE) 10 MG tablet Take 10 mg by mouth every 6 (six) hours as needed. For nausea.      . raltegravir  (ISENTRESS) 400 MG tablet Take 1 tablet (400 mg total) by mouth 2 (two) times daily.  60 tablet  660  . vitamin C (ASCORBIC ACID) 500 MG tablet Take 1,000 mg by mouth daily.        Physical Findings: The patient is in no acute distress. Patient is alert and oriented.  weight is 276 lb 6.4 oz (125.374 kg). His temperature is 98.2 F (36.8 C). His blood pressure is 115/69 and his pulse is 69. Marland Kitchen  Neck is free of adenopathy.  No significant changes.  Lab Findings: Lab Results  Component Value Date   WBC 6.2 05/27/2012   HGB 14.8 05/27/2012   HCT 43.6 05/27/2012   MCV 87.0 05/27/2012   PLT 229 05/27/2012     Impression:  The patient is recovering from the effects of radiation.    Plan:  Follow-up in 6 months.  _____________________________________  Artist Pais. Kathrynn Running, M.D.

## 2012-05-30 NOTE — Progress Notes (Signed)
FU appt. Today.  States he is swallowing better. Able to tolerate all textures of foods and reports that he has his "taste back".    Seen by dr. Gaylyn Rong last on 03/19/12.

## 2012-06-05 ENCOUNTER — Telehealth: Payer: Self-pay | Admitting: Oncology

## 2012-06-05 NOTE — Telephone Encounter (Signed)
called and lm to move 11/20 appt to 11/19 for a chemo pt    aom

## 2012-06-06 ENCOUNTER — Telehealth: Payer: Self-pay | Admitting: Oncology

## 2012-06-06 NOTE — Telephone Encounter (Signed)
s.w. pt and gv new appt time....pt awre

## 2012-06-10 ENCOUNTER — Ambulatory Visit: Payer: 59 | Admitting: Infectious Diseases

## 2012-06-11 ENCOUNTER — Encounter: Payer: Self-pay | Admitting: Infectious Diseases

## 2012-06-11 ENCOUNTER — Ambulatory Visit (INDEPENDENT_AMBULATORY_CARE_PROVIDER_SITE_OTHER): Payer: 59 | Admitting: Infectious Diseases

## 2012-06-11 VITALS — BP 130/78 | HR 73 | Temp 97.5°F | Wt 281.0 lb

## 2012-06-11 DIAGNOSIS — I10 Essential (primary) hypertension: Secondary | ICD-10-CM

## 2012-06-11 DIAGNOSIS — Z113 Encounter for screening for infections with a predominantly sexual mode of transmission: Secondary | ICD-10-CM

## 2012-06-11 DIAGNOSIS — E781 Pure hyperglyceridemia: Secondary | ICD-10-CM

## 2012-06-11 DIAGNOSIS — C8190 Hodgkin lymphoma, unspecified, unspecified site: Secondary | ICD-10-CM

## 2012-06-11 DIAGNOSIS — B20 Human immunodeficiency virus [HIV] disease: Secondary | ICD-10-CM

## 2012-06-11 NOTE — Assessment & Plan Note (Signed)
Appreciate his f/u with PCP. rec he take 4g/day of fish oil.

## 2012-06-11 NOTE — Assessment & Plan Note (Addendum)
He is doing well. Has loss of sex drive since he had ctx. He got flu shot, offered/refused condoms. He is taking his meds well, may be candidate to switch to [facility] when available. Asked him that we will see him back in 6 months with labs prior.

## 2012-06-11 NOTE — Progress Notes (Signed)
  Subjective:    Patient ID: Jon Shannon, male    DOB: January 19, 1966, 46 y.o.   MRN: [id number]  HPI 46 yo M with HIV+ 09-2009, ([id number] mutation). He was started on ISN/TRV. He also has a hx of hyperlipidemia (on lopid) and Hodgkin's Lymphoma. Has now finished his CTX (AVVD)and XRT. 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 Has been taking metformin (started on by oncology).  No problems with ART, is getting patient assistance help for his ISN/TRV. Has gotten flu shot. Has started going to gym again.   HIV 1 RNA Quant (copies/mL)  Date Value  05/27/2012 26*  01/22/2012 <20   07/20/2011 <20      CD4 T Cell Abs (cmm)  Date Value  05/27/2012 670   01/22/2012 640   07/20/2011 940    Lab Results  Component Value Date   CHOL 177 05/27/2012   HDL 31* 05/27/2012   LDLCALC 91 05/27/2012   TRIG 274* 05/27/2012   CHOLHDL 5.7 05/27/2012        Review of Systems  Constitutional: Negative for fever, chills, appetite change and unexpected weight change.  Gastrointestinal: Negative for diarrhea and constipation.  Genitourinary: Negative for difficulty urinating.  Neurological: Positive for numbness.       Objective:   Physical Exam  Constitutional: He appears well-developed and well-nourished.  HENT:  Mouth/Throat: No oropharyngeal exudate.  Eyes: EOM are normal. Pupils are equal, round, and reactive to light.  Neck: Neck supple.  Cardiovascular: Normal rate, regular rhythm and normal heart sounds.   Pulmonary/Chest: Effort normal and breath sounds normal.  Abdominal: Soft. Bowel sounds are normal. There is no tenderness. There is no rebound.  Musculoskeletal: He exhibits no edema.          Assessment & Plan:

## 2012-06-11 NOTE — Assessment & Plan Note (Signed)
Will f/u with his PCP 

## 2012-06-11 NOTE — Assessment & Plan Note (Signed)
Doing very well. Has finished tx. Will cont to f/u h/o.

## 2012-06-18 ENCOUNTER — Other Ambulatory Visit: Payer: 59 | Admitting: Lab

## 2012-06-18 ENCOUNTER — Ambulatory Visit: Payer: 59 | Admitting: Oncology

## 2012-06-18 ENCOUNTER — Other Ambulatory Visit (HOSPITAL_BASED_OUTPATIENT_CLINIC_OR_DEPARTMENT_OTHER): Payer: 59 | Admitting: Lab

## 2012-06-18 ENCOUNTER — Telehealth: Payer: Self-pay | Admitting: Oncology

## 2012-06-18 ENCOUNTER — Ambulatory Visit (HOSPITAL_BASED_OUTPATIENT_CLINIC_OR_DEPARTMENT_OTHER): Payer: 59 | Admitting: Oncology

## 2012-06-18 VITALS — BP 109/74 | HR 68 | Temp 98.2°F | Resp 22 | Ht 72.0 in | Wt 280.0 lb

## 2012-06-18 DIAGNOSIS — C8190 Hodgkin lymphoma, unspecified, unspecified site: Secondary | ICD-10-CM

## 2012-06-18 DIAGNOSIS — C8121 Mixed cellularity classical Hodgkin lymphoma, lymph nodes of head, face, and neck: Secondary | ICD-10-CM

## 2012-06-18 DIAGNOSIS — R5383 Other fatigue: Secondary | ICD-10-CM

## 2012-06-18 DIAGNOSIS — R5381 Other malaise: Secondary | ICD-10-CM

## 2012-06-18 DIAGNOSIS — G622 Polyneuropathy due to other toxic agents: Secondary | ICD-10-CM

## 2012-06-18 DIAGNOSIS — B20 Human immunodeficiency virus [HIV] disease: Secondary | ICD-10-CM

## 2012-06-18 LAB — CBC WITH DIFFERENTIAL/PLATELET
Basophils Absolute: 0 10*3/uL (ref 0.0–0.1)
Eosinophils Absolute: 0.1 10*3/uL (ref 0.0–0.5)
HCT: 42.3 % (ref 38.4–49.9)
HGB: 15 g/dL (ref 13.0–17.1)
LYMPH%: 40.5 % (ref 14.0–49.0)
MCV: 88.2 fL (ref 79.3–98.0)
MONO%: 6.2 % (ref 0.0–14.0)
NEUT#: 3.2 10*3/uL (ref 1.5–6.5)
NEUT%: 52.1 % (ref 39.0–75.0)
Platelets: 232 10*3/uL (ref 140–400)
RBC: 4.79 10*6/uL (ref 4.20–5.82)

## 2012-06-18 LAB — COMPREHENSIVE METABOLIC PANEL (CC13)
Alkaline Phosphatase: 60 U/L (ref 40–150)
BUN: 16 mg/dL (ref 7.0–26.0)
Creatinine: 0.9 mg/dL (ref 0.7–1.3)
Glucose: 96 mg/dl (ref 70–99)
Total Bilirubin: 0.4 mg/dL (ref 0.20–1.20)

## 2012-06-18 LAB — LACTATE DEHYDROGENASE (CC13): LDH: 197 U/L (ref 125–245)

## 2012-06-18 NOTE — Telephone Encounter (Signed)
appts made and printed for pt  Pt aware that cen. sch will call with his scan appt

## 2012-06-18 NOTE — Patient Instructions (Signed)
Lab and CT scan in 3 months. Visit 1-2 days after scan.

## 2012-06-19 ENCOUNTER — Ambulatory Visit: Payer: 59 | Admitting: Oncology

## 2012-06-19 ENCOUNTER — Encounter: Payer: Self-pay | Admitting: Oncology

## 2012-06-19 ENCOUNTER — Other Ambulatory Visit: Payer: 59 | Admitting: Lab

## 2012-06-19 NOTE — Progress Notes (Signed)
[facility]  Telephone:(336) [phone] Fax:([phone]   OFFICE PROGRESS NOTE   Cc:  KHAN,JABER A, MD  DIAGNOSIS: stage II classical Hodgkins' lymphoma; mixed cellularity type, (at least 3 different areas of disease in bilateral neck).   PAST THERAPY:  1. started on 11/24/2011 ABVD d1, 15 every 4 weeks with Neulasta the day after. His last dose of chemo was administered on 03/01/2012.  2. Consolidative Radiation from 04/03/12 to 04/29/12.  CURRENT THERAPY: Watchful observation   INTERVAL HISTORY: Jon Shannon 46 y.o. male returns to clinic today by himself.  He reported that he still has mild neuropathy in the finger tips.  He does not have problem with holding utensils or typing or writing.  He does not want any medication for neuropathy.  He still has fatigue.  He is now back to work full time.  He still has bad insomnia that has been going on for years before cancer diagnosis. He still has intermittent night sweat and heat sensitivity.  He has intermittent dry cough without SOB, chest pain, hemoptysis, DOE, pedal edema, PND.   Patient denies fever, anorexia, weight loss, headache, visual changes, confusion, drenching night sweats, palpable lymph node swelling, mucositis, odynophagia, dysphagia, nausea vomiting, jaundice, gum bleeding, epistaxis, hematemesis, abdominal pain, abdominal swelling, early satiety, melena, hematochezia, hematuria, skin rash, spontaneous bleeding, joint swelling, joint pain, bowel bladder incontinence, back pain, focal motor weakness, depression, suicidal or homicidal ideation, feeling hopelessness.   Past Medical History  Diagnosis Date  . Hyperlipidemia 08/07/2011  . Hodgkin's lymphoma 11/09/2011  . HIV INFECTION 11/30/2009    no history of opportunistic infection  . HYPERTENSION 11/30/2009  . Hypertriglyceridemia   . PTSD (post-traumatic stress disorder)   . DJD (degenerative joint disease)   . Hepatitis A     resolved.   Marland Kitchen Hx of radiation  therapy 04/03/12 -04/29/12    Hodgkin's disease -neck    Past Surgical History  Procedure Date  . Tonsillectomy     as teenager  . Left cervical node excisional biopsy 10/2011  . Left index finger trauma repair   . Powerport     Current Outpatient Prescriptions  Medication Sig Dispense Refill  . fish oil-omega-3 fatty acids 1000 MG capsule Take 1 g by mouth daily.      . B Complex-C (B-COMPLEX WITH VITAMIN C) tablet Take 1 tablet by mouth daily.      . bisoprolol-hydrochlorothiazide (ZIAC) 5-6.25 MG per tablet Take 1 tablet by mouth every morning.       . Cholecalciferol (VITAMIN D3) 1000 UNITS tablet Take 1,000 Units by mouth daily.        . diphenhydrAMINE (BENADRYL) 25 MG tablet Take 25 mg by mouth at bedtime as needed. For sleep      . emollient (BIAFINE) cream Apply topically 2 (two) times daily.      Marland Kitchen emtricitabine-tenofovir (TRUVADA) 200-300 MG per tablet Take 1 tablet by mouth daily.  30 tablet  6  . escitalopram (LEXAPRO) 10 MG tablet Take 10 mg by mouth daily.        Marland Kitchen GARLIC OIL PO Take 1 tablet by mouth daily.      Marland Kitchen gemfibrozil (LOPID) 600 MG tablet Take 600 mg by mouth 2 (two) times daily.      Marland Kitchen loratadine (CLARITIN) 10 MG tablet Take 10 mg by mouth daily.        . metFORMIN (GLUCOPHAGE) 500 MG tablet       . raltegravir (ISENTRESS) 400 MG  tablet Take 1 tablet (400 mg total) by mouth 2 (two) times daily.  60 tablet  660  . vitamin C (ASCORBIC ACID) 500 MG tablet Take 1,000 mg by mouth daily.        ALLERGIES:  is allergic to shrimp.  REVIEW OF SYSTEMS:  The rest of the 14-point review of system was negative.   Filed Vitals:   06/18/12 1349  BP: 109/74  Pulse: 68  Temp: 98.2 F (36.8 C)  Resp: 22   Wt Readings from Last 3 Encounters:  06/18/12 280 lb (127.007 kg)  06/11/12 281 lb (127.461 kg)  05/30/12 276 lb 6.4 oz (125.374 kg)   ECOG Performance status: 0-1  PHYSICAL EXAMINATION:  General: Obese male in no acute distress. Eyes: no scleral icterus. ENT:  There were no oropharyngeal lesions. Neck was without thyromegaly. Lymphatics: Negative cervical, supraclavicular or axillary adenopathy. Left cervical neck node excisional scar has healed without any erythema, purulent discharge or pain on palpation. I could not appreciate any definite cervical adenopathy. Respiratory: lungs were clear bilaterally without wheezing or crackles. Cardiovascular: Regular rate and rhythm, S1/S2, without murmur, rub or gallop. There was no pedal edema. GI: abdomen was soft, flat, nontender, nondistended, without organomegaly. Muscoloskeletal: no spinal tenderness of palpation of vertebral spine. Skin exam was without echymosis, petichae. Neuro exam was nonfocal. Patient was able to get on and off exam table without assistance. Gait was normal. Patient was alerted and oriented. Attention was good. Language was appropriate. Mood was normal without depression. Speech was not pressured. Thought content was not tangential. The nail over the left big toe was missing. The nail bed appeared pink with granulation tissue. There was no purulent discharge.    LABORATORY/RADIOLOGY DATA:  Lab Results  Component Value Date   WBC 6.2 06/18/2012   HGB 15.0 06/18/2012   HCT 42.3 06/18/2012   PLT 232 06/18/2012   GLUCOSE 96 06/18/2012   CHOL 177 05/27/2012   TRIG 274* 05/27/2012   HDL 31* 05/27/2012   LDLCALC 91 05/27/2012   ALKPHOS 60 06/18/2012   ALT 94* 06/18/2012   AST 78* 06/18/2012   NA 139 06/18/2012   K 3.9 06/18/2012   CL 101 06/18/2012   CREATININE 0.9 06/18/2012   BUN 16.0 06/18/2012   CO2 28 06/18/2012   INR 0.98 11/20/2011    ASSESSMENT AND PLAN:   1. HIV: He is on Truvada and Isentress per ID.  2. Hyperlipidemia: He is on gemfibrozil.  3. PTSD: He is on Lexapro with his psychiatrist. His mood while going through chemo has been stable.  4. Hypertension: Good control with Ziac.  5. Hyperglycemia:  Glucose has normalized today. He will followup with his PCP. 6.  GERD:  He was advised not to take PPI or antacid which can interfere with his HIV meds.  I advised him on behavioral changes such as 3-4 hours between dinner and going to bed; no EtOH/carbonated drinks with dinner; elevated the head of his beds.  If these do not improve his GERD, I advised him to follow up with his ID physician for advise on acceptable meds for GERD while being on HIV meds. 7.  Fatigue:  Possibly from chemo with underlying OSA.  I advised him to see his PCP for a referral to sleep study. 8.  Hepatic steatosis:  Due to obesity.  I advised him on regular work out for weight loss to decrease risk of progression of steatosis to cirrhosis in the future.  9.  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  He does not want to take Neurontin or Lyrica.  10.  Left cervical neck node classical Hodgkins' lymphoma: Stage II, low risk, nonbulky. There was only slightly FDG uptake in the abdomen without definitive adenopathy or FDG uptake.  - s/p 4 cycles of ABVD with complete radiographic response on restaging PET scan in 02/2012.  He is done with chemo according to randomized trials in low risk, nonbulky stage II Hodgkin's lymphoma and NCCN guideline.  - He completed consolidative neck irradiation to decrease risk of local recurrence.  - For follow up, he will have a surveillance CT in Feb 2014 unless he has concerning symptoms.   - He was referred to the finding your new normal program per his request.  The length of time of the face-to-face encounter was 15 minutes. More than 50% of time was spent counseling and coordination of care.

## 2012-07-09 ENCOUNTER — Other Ambulatory Visit: Payer: Self-pay | Admitting: Infectious Diseases

## 2012-09-10 ENCOUNTER — Telehealth: Payer: Self-pay | Admitting: Oncology

## 2012-09-10 NOTE — Telephone Encounter (Signed)
called and advised pt that Dr Gaylyn Rong would like to see the pt on 2.18.14.Marland KitchenMarland Kitchenpt ok

## 2012-09-13 ENCOUNTER — Encounter: Payer: Self-pay | Admitting: Infectious Diseases

## 2012-09-14 ENCOUNTER — Encounter: Payer: Self-pay | Admitting: Infectious Diseases

## 2012-09-16 ENCOUNTER — Other Ambulatory Visit (HOSPITAL_BASED_OUTPATIENT_CLINIC_OR_DEPARTMENT_OTHER): Payer: 59

## 2012-09-16 ENCOUNTER — Ambulatory Visit (HOSPITAL_COMMUNITY)
Admission: RE | Admit: 2012-09-16 | Discharge: 2012-09-16 | Disposition: A | Discharge status: Home / Self Care | Payer: 59 | Source: Ambulatory Visit | Attending: Oncology | Admitting: Oncology

## 2012-09-16 DIAGNOSIS — I708 Atherosclerosis of other arteries: Secondary | ICD-10-CM | POA: Insufficient documentation

## 2012-09-16 DIAGNOSIS — C8589 Other specified types of non-Hodgkin lymphoma, extranodal and solid organ sites: Secondary | ICD-10-CM | POA: Insufficient documentation

## 2012-09-16 DIAGNOSIS — C8190 Hodgkin lymphoma, unspecified, unspecified site: Secondary | ICD-10-CM

## 2012-09-16 DIAGNOSIS — R5383 Other fatigue: Secondary | ICD-10-CM

## 2012-09-16 DIAGNOSIS — R160 Hepatomegaly, not elsewhere classified: Secondary | ICD-10-CM | POA: Insufficient documentation

## 2012-09-16 DIAGNOSIS — Z9221 Personal history of antineoplastic chemotherapy: Secondary | ICD-10-CM | POA: Insufficient documentation

## 2012-09-16 DIAGNOSIS — R918 Other nonspecific abnormal finding of lung field: Secondary | ICD-10-CM | POA: Insufficient documentation

## 2012-09-16 DIAGNOSIS — C8121 Mixed cellularity classical Hodgkin lymphoma, lymph nodes of head, face, and neck: Secondary | ICD-10-CM

## 2012-09-16 DIAGNOSIS — K7689 Other specified diseases of liver: Secondary | ICD-10-CM | POA: Insufficient documentation

## 2012-09-16 LAB — CBC WITH DIFFERENTIAL/PLATELET
BASO%: 0.3 % (ref 0.0–2.0)
Eosinophils Absolute: 0.1 10*3/uL (ref 0.0–0.5)
LYMPH%: 42.2 % (ref 14.0–49.0)
MCHC: 34.4 g/dL (ref 32.0–36.0)
MONO#: 0.4 10*3/uL (ref 0.1–0.9)
NEUT#: 3.3 10*3/uL (ref 1.5–6.5)
Platelets: 227 10*3/uL (ref 140–400)
RBC: 4.7 10*6/uL (ref 4.20–5.82)
RDW: 13.1 % (ref 11.0–14.6)
WBC: 6.5 10*3/uL (ref 4.0–10.3)
lymph#: 2.8 10*3/uL (ref 0.9–3.3)

## 2012-09-16 LAB — COMPREHENSIVE METABOLIC PANEL (CC13)
ALT: 60 U/L — ABNORMAL HIGH (ref 0–55)
Albumin: 4.3 g/dL (ref 3.5–5.0)
CO2: 28 mEq/L (ref 22–29)
Calcium: 10.2 mg/dL (ref 8.4–10.4)
Chloride: 100 mEq/L (ref 98–107)
Glucose: 116 mg/dl — ABNORMAL HIGH (ref 70–99)
Sodium: 139 mEq/L (ref 136–145)
Total Bilirubin: 0.42 mg/dL (ref 0.20–1.20)
Total Protein: 8.1 g/dL (ref 6.4–8.3)

## 2012-09-16 LAB — LACTATE DEHYDROGENASE (CC13): LDH: 179 U/L (ref 125–245)

## 2012-09-16 MED ORDER — IOHEXOL 300 MG/ML  SOLN
125.0000 mL | Freq: Once | INTRAMUSCULAR | Status: AC | PRN
  Administered 2012-09-16: 125 mL via INTRAVENOUS

## 2012-09-17 ENCOUNTER — Ambulatory Visit (HOSPITAL_BASED_OUTPATIENT_CLINIC_OR_DEPARTMENT_OTHER): Payer: 59 | Admitting: Oncology

## 2012-09-17 ENCOUNTER — Telehealth: Payer: Self-pay | Admitting: Oncology

## 2012-09-17 VITALS — BP 142/84 | HR 84 | Temp 97.8°F | Resp 18 | Ht 72.0 in | Wt 294.8 lb

## 2012-09-17 DIAGNOSIS — C8121 Mixed cellularity classical Hodgkin lymphoma, lymph nodes of head, face, and neck: Secondary | ICD-10-CM

## 2012-09-17 DIAGNOSIS — C8190 Hodgkin lymphoma, unspecified, unspecified site: Secondary | ICD-10-CM

## 2012-09-17 DIAGNOSIS — G620 Drug-induced polyneuropathy: Secondary | ICD-10-CM

## 2012-09-17 DIAGNOSIS — R5383 Other fatigue: Secondary | ICD-10-CM

## 2012-09-17 DIAGNOSIS — R5381 Other malaise: Secondary | ICD-10-CM

## 2012-09-17 DIAGNOSIS — Z21 Asymptomatic human immunodeficiency virus [HIV] infection status: Secondary | ICD-10-CM

## 2012-09-17 NOTE — Telephone Encounter (Signed)
Gave pt appt for lab and MD on August 2014 , gave pt oral contrast

## 2012-09-17 NOTE — Progress Notes (Signed)
[facility]  Telephone:(336) [phone] Fax:(336) [phone]   OFFICE PROGRESS NOTE   Cc:  KHAN,JABER A, MD  DIAGNOSIS: stage II classical Hodgkins' lymphoma; mixed cellularity type, (at least 3 different areas of disease in bilateral neck).   PAST THERAPY: started on 11/24/2011 ABVD d1, 15 every 4 weeks with Neulasta the day after. His last dose of chemo was administered on 03/01/2012.  He also underwent consolidative radiation.   CURRENT THERAPY:  Watchful observation.   INTERVAL HISTORY: Jon Shannon 47 y.o. male returns to clinic today by himself.  He complained of neuropathy in the fingers and toes.  He has problem with fastening his jewelry.  He makes occasional mistakes with typing.  He denied problem with dropping utensils.  He stumps his toes against furniture since he cannot feel his toes at time.  He denied anorexia, weight loss, palpable nodes, night sweat.  He has bilateral shoulder pain and has not been lifting weight.  He does not do aerobic exercises.  He feels fatigue all the time.  He does not have the drive to do anything.  The rest of the 14 point review of system was negative.    Past Medical History  Diagnosis Date  . Hyperlipidemia 08/07/2011  . Hodgkin's lymphoma 11/09/2011  . HIV INFECTION 11/30/2009    no history of opportunistic infection  . HYPERTENSION 11/30/2009  . Hypertriglyceridemia   . PTSD (post-traumatic stress disorder)   . DJD (degenerative joint disease)   . Hepatitis A     resolved.   Marland Kitchen Hx of radiation therapy 04/03/12 -04/29/12    Hodgkin's disease -neck    Past Surgical History  Procedure Laterality Date  . Tonsillectomy      as teenager  . Left cervical node excisional biopsy  10/2011  . Left index finger trauma repair    . Powerport      Current Outpatient Prescriptions  Medication Sig Dispense Refill  . B Complex-C (B-COMPLEX WITH VITAMIN C) tablet Take 1 tablet by mouth daily.      . bisoprolol-hydrochlorothiazide  (ZIAC) 5-6.25 MG per tablet Take 1 tablet by mouth every morning.       . Cholecalciferol (VITAMIN D3) 1000 UNITS tablet Take 1,000 Units by mouth daily.        . diphenhydrAMINE (BENADRYL) 25 MG tablet Take 25 mg by mouth at bedtime as needed. For sleep      . emtricitabine-tenofovir (TRUVADA) 200-300 MG per tablet Take 1 tablet by mouth daily.  30 tablet  6  . escitalopram (LEXAPRO) 10 MG tablet Take 10 mg by mouth daily.        . fish oil-omega-3 fatty acids 1000 MG capsule Take 1 g by mouth daily.      Marland Kitchen GARLIC OIL PO Take 1 tablet by mouth daily.      Marland Kitchen gemfibrozil (LOPID) 600 MG tablet TAKE 1 TABLET BY MOUTH 2 TIMES DAILY.  60 tablet  11  . loratadine (CLARITIN) 10 MG tablet Take 10 mg by mouth daily.        . raltegravir (ISENTRESS) 400 MG tablet Take 1 tablet (400 mg total) by mouth 2 (two) times daily.  60 tablet  660  . vitamin C (ASCORBIC ACID) 500 MG tablet Take 1,000 mg by mouth daily.       No current facility-administered medications for this visit.    ALLERGIES:  is allergic to shrimp.  REVIEW OF SYSTEMS:  The rest of the 14-point review  of system was negative.   Filed Vitals:   09/17/12 0909  BP: 142/84  Pulse: 84  Temp: 97.8 F (36.6 C)  Resp: 18   Wt Readings from Last 3 Encounters:  09/17/12 294 lb 12.8 oz (133.72 kg)  06/18/12 280 lb (127.007 kg)  06/11/12 281 lb (127.461 kg)   ECOG Performance status: 0-1  PHYSICAL EXAMINATION:  General: Obese male in no acute distress. Eyes: no scleral icterus. ENT: There were no oropharyngeal lesions. Neck was without thyromegaly. Lymphatics: Negative cervical, supraclavicular or axillary adenopathy. Left cervical neck node excisional scar has healed without any erythema, purulent discharge or pain on palpation. I could not appreciate any definite cervical adenopathy. Respiratory: lungs were clear bilaterally without wheezing or crackles. Cardiovascular: Regular rate and rhythm, S1/S2, without murmur, rub or gallop. There  was no pedal edema. GI: abdomen was soft, flat, nontender, nondistended, without organomegaly. Muscoloskeletal: no spinal tenderness of palpation of vertebral spine. Skin exam was without echymosis, petichae. Neuro exam was nonfocal. Patient was able to get on and off exam table without assistance. Gait was normal. Patient was alerted and oriented. Attention was good. Language was appropriate. Mood was normal without depression. Speech was not pressured. Thought content was not tangential. The nail over the left big toe was missing. The nail bed appeared pink with granulation tissue. There was no purulent discharge.    LABORATORY/RADIOLOGY DATA:  Lab Results  Component Value Date   WBC 6.5 09/16/2012   HGB 14.6 09/16/2012   HCT 42.5 09/16/2012   PLT 227 09/16/2012   GLUCOSE 116* 09/16/2012   CHOL 177 05/27/2012   TRIG 274* 05/27/2012   HDL 31* 05/27/2012   LDLCALC 91 05/27/2012   ALKPHOS 59 09/16/2012   ALT 60* 09/16/2012   AST 40* 09/16/2012   NA 139 09/16/2012   K 4.1 09/16/2012   CL 100 09/16/2012   CREATININE 1.0 09/16/2012   BUN 13.6 09/16/2012   CO2 28 09/16/2012   INR 0.98 11/20/2011    IMAGING:  I personally reviewed the following CT scan and showed the images to the patient  CT NECK, CHEST, ABDOMEN AND PELVIS WITH CONTRAST  Technique: Multidetector CT imaging of the neck, chest, abdomen  and pelvis was performed using the standard protocol following the  bolus administration of intravenous contrast.  Contrast: OMNIPAQUE IOHEXOL 300 MG/ML SOLN  Comparison: PET CT 03/18/2012. CTs of the neck, chest, abdomen  and pelvis 11/17/2011.  CT NECK  Findings: Small cervical lymph nodes appear unchanged. There are  no pathologically enlarged lymph nodes. No lesions of the  pharyngeal mucosal space are identified. The salivary and thyroid  glands appear unremarkable. Mild thickening of the platysmus muscle  on the left is unchanged.  The visualized intracranial and orbital contents  appear  unremarkable. The paranasal sinuses are clear. There are no  worrisome osseous findings.  IMPRESSION:  Stable examination. No evidence of recurrent cervical lymphoma.  CT CHEST  Findings: There are no pathologically enlarged mediastinal, hilar  or axillary lymph nodes. Small axillary lymph nodes bilaterally  are unchanged.  The heart and great vessels demonstrate no suspicious findings.  There is no pleural or pericardial effusion. A 5 mm left lower  lobe pulmonary nodule on image 42 is unchanged. The other tiny left  lower lobe nodule on image 34 appears slightly smaller. The lungs  are otherwise clear.  IMPRESSION:  Stable chest CT. No evidence of recurrent lymphoma.  CT ABDOMEN AND PELVIS  Findings: Severe hepatic steatosis  and hepatomegaly are again  noted. No focal lesions are identified. The spleen remains stable  in size and shows no focal abnormality. The gallbladder, biliary  system, pancreas, adrenal glands and kidneys appear normal.  Scattered mildly prominent abdominal pelvic lymph nodes are  unchanged. There is a node in the porta caval space which measures  1.6 cm short axis on image 67. An aortocaval node measures 1.2 cm  on image 75. A left pelvic sidewall node measures 9 mm on image  114. No progressive adenopathy is seen. There is stable mild  iliac atherosclerosis and mildly prominent fat in both inguinal  canals.  The osseous structures appear unchanged with hemangiomas in the  thoracic and lumbar spine.  IMPRESSION:  Stable abdominal pelvic CT. Small residual abdominal pelvic lymph  nodes are unchanged. No evidence of recurrent lymphoma.  Persistent hepatic steatosis and hepatomegaly.   ASSESSMENT AND PLAN:   1. HIV: He is on Truvada and Isentress per ID.  2. Hyperlipidemia: He is on gemfibrozil.  3. PTSD: He is on Lexapro with his psychiatrist.  4. Hypertension: Good control with Ziac.  5.  Fatigue: I sent for TSH to rule out hypothyroid from  radiation.  6.  Hepatic steatosis:  Due to obesity.  I advised him again on aerobic exercise.  7.  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  Other etiologies:  HIV?  He did not want to take Neurontin.  8.   Left cervical neck node classical Hodgkins' lymphoma: Stage II, low risk, nonbulky. There was only slightly FDG uptake in the abdomen without definitive adenopathy or FDG uptake.  - s/p 4 cycles of ABVD with complete radiographic response on restaging PET scan in 02/2012.  CT scan today showed that he is again in remission.  - I recommended watchful observation with again CT in about 6 months.  After two years, we'll go on yearly CT.   Patient expressed informed understanding and agreed with the recommendations.    The length of time of the face-to-face encounter was 15 minutes. More than 50% of time was spent counseling and coordination of care.

## 2012-09-17 NOTE — Patient Instructions (Signed)
1.  History of Hodgkin's lymphoma 2.  Treatment:  ABVD and radiation. 3.  Status:  In remission per today CT scan. 4.  Follow up:  In about 6 months with CT the day before.

## 2012-09-18 ENCOUNTER — Ambulatory Visit: Payer: 59 | Admitting: Oncology

## 2012-09-18 ENCOUNTER — Ambulatory Visit (HOSPITAL_COMMUNITY): Payer: 59

## 2012-11-04 ENCOUNTER — Encounter: Payer: Self-pay | Admitting: Infectious Diseases

## 2012-11-04 ENCOUNTER — Encounter: Payer: Self-pay | Admitting: Oncology

## 2012-11-14 ENCOUNTER — Encounter: Payer: Self-pay | Admitting: Radiation Oncology

## 2012-11-14 ENCOUNTER — Ambulatory Visit
Admission: RE | Admit: 2012-11-14 | Discharge: 2012-11-14 | Disposition: A | Discharge status: Home / Self Care | Payer: 59 | Source: Ambulatory Visit | Attending: Radiation Oncology | Admitting: Radiation Oncology

## 2012-11-14 VITALS — BP 130/84 | HR 80 | Temp 98.3°F | Wt 296.1 lb

## 2012-11-14 DIAGNOSIS — C8190 Hodgkin lymphoma, unspecified, unspecified site: Secondary | ICD-10-CM

## 2012-11-14 NOTE — Progress Notes (Signed)
Patient here for routine follow up completion of left neck radiation completed on 04/29/12.Doing well from standpoint of radiation.Some dry mouth.Continued neuropathy of fingers and toes.Newly diagnosed fibromyalgia and lower back pain with sciatica.

## 2012-11-14 NOTE — Progress Notes (Signed)
  Radiation Oncology         [phone]) [phone] ________________________________  Name: Jon Shannon MRN: [id number]  Date: 11/14/2012  DOB: May 27, 1966  Follow-Up Visit Note  CC: Lise Auer, MD  Lise Auer, MD  Diagnosis:   47 year old gentleman with stage IIA mixed cellularity Hodgkin's disease presenting in the neck  Interval Since Last Radiation:  6  months  Narrative:  The patient returns today for routine follow-up.  He has some xerostomia and persistent epilation, but no other treatment-related complaints.  He is suffering with fibromyalgia and sciatica currently.                              ALLERGIES:  is allergic to shrimp.  Meds: Current Outpatient Prescriptions  Medication Sig Dispense Refill  . B Complex-C (B-COMPLEX WITH VITAMIN C) tablet Take 1 tablet by mouth daily.      . bisoprolol-hydrochlorothiazide (ZIAC) 5-6.25 MG per tablet Take 1 tablet by mouth every morning.       . Cholecalciferol (VITAMIN D3) 1000 UNITS tablet Take 1,000 Units by mouth daily.        . cyclobenzaprine (FLEXERIL) 10 MG tablet Take 10 mg by mouth.      . diphenhydrAMINE (BENADRYL) 25 MG tablet Take 25 mg by mouth at bedtime as needed. For sleep      . emtricitabine-tenofovir (TRUVADA) 200-300 MG per tablet Take 1 tablet by mouth daily.  30 tablet  6  . escitalopram (LEXAPRO) 10 MG tablet Take 10 mg by mouth daily.        . fish oil-omega-3 fatty acids 1000 MG capsule Take 1 g by mouth daily.      Marland Kitchen GARLIC OIL PO Take 1 tablet by mouth daily.      Marland Kitchen gemfibrozil (LOPID) 600 MG tablet TAKE 1 TABLET BY MOUTH 2 TIMES DAILY.  60 tablet  11  . ibuprofen (ADVIL,MOTRIN) 800 MG tablet Take by mouth every 8 (eight) hours as needed for pain.      Marland Kitchen loratadine (CLARITIN) 10 MG tablet Take 10 mg by mouth daily.        . raltegravir (ISENTRESS) 400 MG tablet Take 1 tablet (400 mg total) by mouth 2 (two) times daily.  60 tablet  660  . vitamin C (ASCORBIC ACID) 500 MG tablet Take 1,000 mg by mouth daily.        No current facility-administered medications for this encounter.    Physical Findings: The patient is in no acute distress. Patient is alert and oriented.  weight is 296 lb 1.6 oz (134.31 kg). His temperature is 98.3 F (36.8 C). His blood pressure is 130/84 and his pulse is 80. His oxygen saturation is 97%. .Neck is free of adenopathy.  Waldeyer's ring is symmetric on direct inspection.  No significant changes.  Lab Findings: Lab Results  Component Value Date   WBC 6.5 09/16/2012   HGB 14.6 09/16/2012   HCT 42.5 09/16/2012   MCV 90.5 09/16/2012   PLT 227 09/16/2012   Impression:  The patient has no evidence of recurrence.  Plan:  Follow-up in 6 months.  _____________________________________  Artist Pais. Kathrynn Running, M.D.

## 2012-11-21 ENCOUNTER — Ambulatory Visit: Payer: 59 | Admitting: Radiation Oncology

## 2012-12-01 ENCOUNTER — Encounter: Payer: Self-pay | Admitting: Infectious Diseases

## 2012-12-16 ENCOUNTER — Other Ambulatory Visit: Payer: Self-pay | Admitting: Infectious Disease

## 2012-12-16 ENCOUNTER — Other Ambulatory Visit: Payer: 59

## 2012-12-16 DIAGNOSIS — Z113 Encounter for screening for infections with a predominantly sexual mode of transmission: Secondary | ICD-10-CM

## 2012-12-16 DIAGNOSIS — B20 Human immunodeficiency virus [HIV] disease: Secondary | ICD-10-CM

## 2012-12-16 DIAGNOSIS — E781 Pure hyperglyceridemia: Secondary | ICD-10-CM

## 2012-12-16 LAB — CBC
MCH: 30.7 pg (ref 26.0–34.0)
MCV: 87.6 fL (ref 78.0–100.0)
Platelets: 215 10*3/uL (ref 150–400)
RBC: 4.59 MIL/uL (ref 4.22–5.81)
RDW: 13.8 % (ref 11.5–15.5)

## 2012-12-16 LAB — COMPREHENSIVE METABOLIC PANEL
ALT: 56 U/L — ABNORMAL HIGH (ref 0–53)
AST: 46 U/L — ABNORMAL HIGH (ref 0–37)
Creat: 1.19 mg/dL (ref 0.50–1.35)
Sodium: 138 mEq/L (ref 135–145)
Total Bilirubin: 0.4 mg/dL (ref 0.3–1.2)
Total Protein: 7.5 g/dL (ref 6.0–8.3)

## 2012-12-16 LAB — LIPID PANEL: Total CHOL/HDL Ratio: 7.3 Ratio

## 2012-12-17 LAB — T-HELPER CELL (CD4) - (RCID CLINIC ONLY)
CD4 % Helper T Cell: 29 % — ABNORMAL LOW (ref 33–55)
CD4 T Cell Abs: 640 uL (ref 400–2700)

## 2012-12-17 LAB — RPR

## 2012-12-18 LAB — HIV-1 RNA QUANT-NO REFLEX-BLD
HIV 1 RNA Quant: <20 copies/mL (ref <20)
HIV-1 RNA Quant, Log: <1.3 {Log} (ref <1.30)

## 2012-12-27 ENCOUNTER — Encounter: Payer: Self-pay | Admitting: Infectious Diseases

## 2012-12-27 ENCOUNTER — Telehealth: Payer: Self-pay | Admitting: *Deleted

## 2012-12-27 NOTE — Telephone Encounter (Signed)
Pt needs to restart as written, come in for labs in 1 month. If he does not want to take bid can change to different med qday

## 2012-12-27 NOTE — Telephone Encounter (Signed)
Patient called stating he has missed his second dose of Isentress for anywhere from 4 to 18 days. He has only been taking it once daily in error. He is now concerned about resistance, please advise. Jon Shannon

## 2012-12-30 ENCOUNTER — Ambulatory Visit (INDEPENDENT_AMBULATORY_CARE_PROVIDER_SITE_OTHER): Payer: 59 | Admitting: Infectious Diseases

## 2012-12-30 ENCOUNTER — Encounter: Payer: Self-pay | Admitting: Infectious Diseases

## 2012-12-30 VITALS — BP 143/86 | HR 73 | Temp 98.3°F | Ht 72.0 in | Wt 298.0 lb

## 2012-12-30 DIAGNOSIS — Z23 Encounter for immunization: Secondary | ICD-10-CM

## 2012-12-30 DIAGNOSIS — R413 Other amnesia: Secondary | ICD-10-CM

## 2012-12-30 DIAGNOSIS — C8190 Hodgkin lymphoma, unspecified, unspecified site: Secondary | ICD-10-CM

## 2012-12-30 DIAGNOSIS — I10 Essential (primary) hypertension: Secondary | ICD-10-CM

## 2012-12-30 DIAGNOSIS — E119 Type 2 diabetes mellitus without complications: Secondary | ICD-10-CM

## 2012-12-30 DIAGNOSIS — R4189 Other symptoms and signs involving cognitive functions and awareness: Secondary | ICD-10-CM | POA: Insufficient documentation

## 2012-12-30 DIAGNOSIS — B20 Human immunodeficiency virus [HIV] disease: Secondary | ICD-10-CM

## 2012-12-30 DIAGNOSIS — E785 Hyperlipidemia, unspecified: Secondary | ICD-10-CM

## 2012-12-30 NOTE — Assessment & Plan Note (Addendum)
He's doing very well. Has gotten back to taking his art as was written. Update his TDaP today. Offered/reufsed condoms.  will see him back in 6 months.

## 2012-12-30 NOTE — Progress Notes (Signed)
  Subjective:    Patient ID: Jon Shannon, male    DOB: [birth year], 47 y.o.   MRN: [id number]  HPI 47 yo M with HIV+ 09-2009, (G190A mutation). He was started on ISN/TRV. He also has a hx of hyperlipidemia (on lopid) and Hodgkin's Lymphoma. Has now finished his CTX (AVVD)and XRT (plan for repeat CT scan in around August). Marland Kitchen Follows with Dr Park Breed for his HTN, steroid induced DM.  Having trouble with memory- forgetting things, loosing words. Has missed several days of ISN since last visit. Not sure how many doses he missed.   HIV 1 RNA Quant (copies/mL)  Date Value  12/16/2012 <20   05/27/2012 26*  01/22/2012 <20      CD4 T Cell Abs (cmm)  Date Value  12/16/2012 640   05/27/2012 670   01/22/2012 640       Review of Systems  Constitutional: Negative for appetite change and unexpected weight change.  Respiratory: Negative for shortness of breath.   Cardiovascular: Negative for chest pain.  Gastrointestinal: Negative for diarrhea and constipation.  Genitourinary: Negative for difficulty urinating.  Neurological: Positive for numbness.  Psychiatric/Behavioral: The patient is nervous/anxious.        Objective:   Physical Exam  Constitutional: He appears well-developed and well-nourished.  HENT:  Mouth/Throat: No oropharyngeal exudate.  Eyes: EOM are normal. Pupils are equal, round, and reactive to light.  Neck: Neck supple.  Cardiovascular: Normal rate, regular rhythm and normal heart sounds.   Pulmonary/Chest: Effort normal and breath sounds normal.  Abdominal: Soft. Bowel sounds are normal. There is no tenderness.  Lymphadenopathy:    He has no cervical adenopathy.  Psychiatric: He has a normal mood and affect. Thought content normal.          Assessment & Plan:

## 2012-12-30 NOTE — Assessment & Plan Note (Signed)
Has f/u with Dr Park Breed, encouraged pt to continue to exercise.

## 2012-12-30 NOTE — Assessment & Plan Note (Signed)
Given pt's hx of lymphoma, will send him for MRI. I suggested most likely due to stress.

## 2012-12-30 NOTE — Assessment & Plan Note (Signed)
I encouraged pt to f/u with Dr Park Breed, he may need to be restarted on Metformin.

## 2012-12-30 NOTE — Addendum Note (Signed)
Addended by: Lurlean Leyden on: 12/30/2012 10:45 AM   Modules accepted: Orders

## 2012-12-30 NOTE — Progress Notes (Signed)
HPI: Jon Shannon is a 47 y.o. male with HIV currently under excellent control. He has a long hx of hyperlipidemia currently on gemfibrozil and fish oil. Also has hx of Hodgkin's Lymphoma and steroid induced DM.  Allergies: Allergies  Allergen Reactions  . Shrimp (Shellfish Allergy) Anaphylaxis    Vitals: Temp: 98.3 F (36.8 C) (06/02 0842) Temp src: Oral (06/02 0842) BP: 143/86 mmHg (06/02 0842) Pulse Rate: 73 (06/02 0842)  Past Medical History: Past Medical History  Diagnosis Date  . Hyperlipidemia 08/07/2011  . Hodgkin's lymphoma 11/09/2011  . HIV INFECTION 11/30/2009    no history of opportunistic infection  . HYPERTENSION 11/30/2009  . Hypertriglyceridemia   . PTSD (post-traumatic stress disorder)   . DJD (degenerative joint disease)   . Hepatitis A     resolved.   Marland Kitchen Hx of radiation therapy 04/03/12 -04/29/12    Hodgkin's disease -neck    Social History: History   Social History  . Marital Status: Single    Spouse Name: N/A    Number of Children: 0  . Years of Education: 17   Occupational History  . Job Dispensing optician [employer]   Social History Main Topics  . Smoking status: Former Smoker -- 1.00 packs/day    Types: Cigarettes    Quit date: 07/01/2011  . Smokeless tobacco: Never Used  . Alcohol Use: No  . Drug Use: No  . Sexually Active: Not Currently     Comment: pt. declined condoms   Other Topics Concern  . None   Social History Narrative  . None    Current Regimen: ISN/TRV  Labs: HIV 1 RNA Quant (copies/mL)  Date Value  12/16/2012 <20   05/27/2012 26*  01/22/2012 <20      CD4 T Cell Abs (cmm)  Date Value  12/16/2012 640   05/27/2012 670   01/22/2012 640      Hep B S Ab (no units)  Date Value  11/30/2009 POS*     Hepatitis B Surface Ag (no units)  Date Value  11/30/2009 NEG      HCV Ab (no units)  Date Value  11/30/2009 NEG     CrCl: Estimated Creatinine Clearance: 109.2 ml/min (by C-G formula based on Cr of 1.19).  Lipids:     Component Value Date/Time   CHOL 183 12/16/2012 1540   TRIG 620* 12/16/2012 1540   HDL 25* 12/16/2012 1540   CHOLHDL 7.3 12/16/2012 1540   VLDL NOT CALC 12/16/2012 1540   LDLCALC Comment:   Not calculated due to Triglyceride >400. Suggest ordering Direct LDL (Unit Code: 16109).   Total Cholesterol/HDL Ratio:CHD Risk                        Coronary Heart Disease Risk Table                                        Men       Women          1/2 Average Risk              3.4        3.3              Average Risk              5.0        4.4  2X Average Risk              9.6        7.1           3X Average Risk             23.4       11.0 Use the calculated Patient Ratio above and the CHD Risk table  to determine the patient's CHD Risk. ATP III Classification (LDL):       < 100        mg/dL         Optimal      409 - 129     mg/dL         Near or Above Optimal      130 - 159     mg/dL         Borderline High      160 - 189     mg/dL         High       > 811        mg/dL         Very High   04/13/7828 1540    Assessment: Spoke to patient about his elevated TGs. He is unsure if he was fasting when they were drawn, but they have been >400 many times in the past as well. Also spoke about getting his A1c checked by his PCP if it has not been recently checked to see if his CBGs have returned to normal or if they are contributing to his elevated TGs.   Recommendations: Continue current ART and gemfibrozil Increase Fish oil to 1g twice a day, would consider up to 2g twice a day if TGs remain elevated.  Drue Stager, PharmD [facility] 12/30/2012, 1:05 PM

## 2012-12-30 NOTE — Assessment & Plan Note (Signed)
Greatly appreciate Dr Lodema Pilot expertise.

## 2012-12-31 NOTE — Telephone Encounter (Signed)
Patient notified

## 2013-01-06 ENCOUNTER — Other Ambulatory Visit: Payer: Self-pay | Admitting: Infectious Diseases

## 2013-01-06 ENCOUNTER — Encounter: Payer: Self-pay | Admitting: Infectious Diseases

## 2013-01-06 DIAGNOSIS — R413 Other amnesia: Secondary | ICD-10-CM

## 2013-01-09 ENCOUNTER — Ambulatory Visit (HOSPITAL_COMMUNITY)
Admission: RE | Admit: 2013-01-09 | Discharge: 2013-01-09 | Disposition: A | Discharge status: Home / Self Care | Payer: 59 | Source: Ambulatory Visit | Attending: Infectious Diseases | Admitting: Infectious Diseases

## 2013-01-09 DIAGNOSIS — R4789 Other speech disturbances: Secondary | ICD-10-CM | POA: Insufficient documentation

## 2013-01-09 DIAGNOSIS — C8190 Hodgkin lymphoma, unspecified, unspecified site: Secondary | ICD-10-CM | POA: Insufficient documentation

## 2013-01-09 DIAGNOSIS — R413 Other amnesia: Secondary | ICD-10-CM

## 2013-01-09 MED ORDER — GADOBENATE DIMEGLUMINE 529 MG/ML IV SOLN
20.0000 mL | Freq: Once | INTRAVENOUS | Status: AC
  Administered 2013-01-09: 20 mL via INTRAVENOUS

## 2013-01-17 NOTE — Telephone Encounter (Signed)
error 

## 2013-01-25 IMAGING — PT NM PET TUM IMG INITIAL (PI) SKULL BASE T - THIGH
1 of 6 series · 1 of 25 positions shown · non-contrast
Comparison: CT 11/17/2011

CLINICAL DATA: Initial treatment strategy for Hodgkin's lymphoma
with cervical adenopathy.

NUCLEAR MEDICINE PET SKULL BASE TO THIGH
Fasting Blood Glucose:  151
TECHNIQUE: 14.8 mCi F-18 FDG was injected intravenously.   CT data
was obtained and used for attenuation correction and anatomic
localization only.  (This was not acquired as a diagnostic CT
examination.) Additional exam technical data entered  on
technologist worksheet.

[Series 2: ct images · axial · 3.8mm · 0.98mm/px · 1 of 266 slices shown]
[im 266/266  brain]
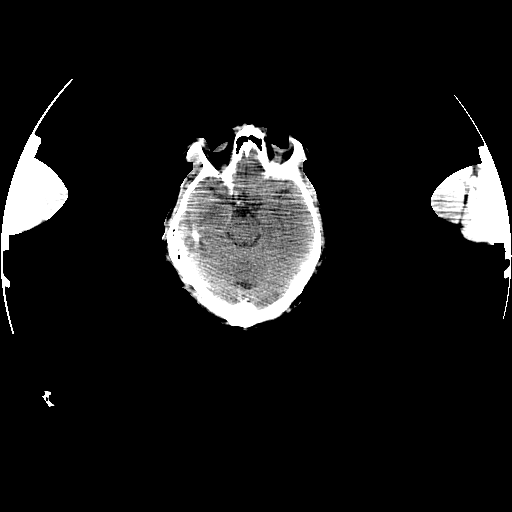

[1 of 25 positions shown; findings below may reference images not displayed]

FINDINGS: Neck: There is a focus of abnormal hypermetabolic activity at the
biopsy site just beneath the left platysmus (image 35) with SUV max
= 4.7.  There is a single hypermetabolic left level II lymph node
which measures 11 mm (image 38) with SUV max = 4.0 (image 42 of the
PET imaging, mild misregistration).  No additional hypermetabolic
cervical lymph nodes.

Chest: No hypermetabolic axillary or mediastinal nodes.  6 mm
nodule at the left lung base does not have associated metabolic
activity.

Abdomen / Pelvis:No abnormal hypermetabolic active within the
spleen which is normal size.  Small periaortic lymph nodes
described on comparison CT do not have associate hypermetabolic
activity.  No abnormal activity within the liver.

Within the pelvis, the left obturator node has questionable
hypermetabolic activity.  There is a focus of activity in the
region of the lymph node (image 221) which is not as large as the
lymph node ( SUV max = 4.1).

Skeleton:No focal hypermetabolic activity to suggest skeletal
metastasis.
IMPRESSION: 1..  Hypermetabolic activity in the left neck at the site of biopsy
as well as a single left level II lymph node.
2.  No evidence metastasis in the thorax.  Small pulmonary nodule.
Recommend attention on follow-up.
3.  Questionable metabolic activity and left obturator lymph node.
No hypermetabolic activity within the small  periaortic lymph
nodes.

## 2013-01-30 ENCOUNTER — Other Ambulatory Visit: Payer: 59

## 2013-01-30 DIAGNOSIS — B20 Human immunodeficiency virus [HIV] disease: Secondary | ICD-10-CM

## 2013-01-30 LAB — CBC
HCT: 39.5 % (ref 39.0–52.0)
MCHC: 34.9 g/dL (ref 30.0–36.0)
MCV: 88.4 fL (ref 78.0–100.0)
RDW: 13.6 % (ref 11.5–15.5)

## 2013-01-30 LAB — COMPREHENSIVE METABOLIC PANEL
AST: 37 U/L (ref 0–37)
Alkaline Phosphatase: 61 U/L (ref 39–117)
BUN: 13 mg/dL (ref 6–23)
Creat: 0.97 mg/dL (ref 0.50–1.35)

## 2013-02-03 LAB — HIV-1 RNA QUANT-NO REFLEX-BLD: HIV-1 RNA Quant, Log: <1.3 {Log} (ref <1.30)

## 2013-02-07 ENCOUNTER — Encounter: Payer: Self-pay | Admitting: Infectious Diseases

## 2013-02-19 ENCOUNTER — Other Ambulatory Visit: Payer: Self-pay | Admitting: Licensed Clinical Social Worker

## 2013-02-19 DIAGNOSIS — B20 Human immunodeficiency virus [HIV] disease: Secondary | ICD-10-CM

## 2013-02-19 MED ORDER — EMTRICITABINE-TENOFOVIR DF 200-300 MG PO TABS: 1.0000 | ORAL_TABLET | Freq: Every day | ORAL | Status: DC

## 2013-02-21 ENCOUNTER — Other Ambulatory Visit: Payer: Self-pay | Admitting: *Deleted

## 2013-02-21 DIAGNOSIS — B20 Human immunodeficiency virus [HIV] disease: Secondary | ICD-10-CM

## 2013-02-21 MED ORDER — RALTEGRAVIR POTASSIUM 400 MG PO TABS: 400.0000 mg | ORAL_TABLET | Freq: Two times a day (BID) | ORAL | Status: DC

## 2013-02-21 MED ORDER — EMTRICITABINE-TENOFOVIR DF 200-300 MG PO TABS: 1.0000 | ORAL_TABLET | Freq: Every day | ORAL | Status: DC

## 2013-02-28 ENCOUNTER — Other Ambulatory Visit: Payer: Self-pay | Admitting: *Deleted

## 2013-02-28 DIAGNOSIS — B20 Human immunodeficiency virus [HIV] disease: Secondary | ICD-10-CM

## 2013-02-28 MED ORDER — EMTRICITABINE-TENOFOVIR DF 200-300 MG PO TABS: 1.0000 | ORAL_TABLET | Freq: Every day | ORAL | Status: DC

## 2013-02-28 MED ORDER — RALTEGRAVIR POTASSIUM 400 MG PO TABS: 400.0000 mg | ORAL_TABLET | Freq: Two times a day (BID) | ORAL | Status: DC

## 2013-02-28 NOTE — Telephone Encounter (Signed)
Patient pharmacy called and advised they had not received the Rx. Gave a verbal and changed it to 30 day supply as they will not pay for a 90 day supply.

## 2013-03-14 ENCOUNTER — Ambulatory Visit (HOSPITAL_COMMUNITY)
Admission: RE | Admit: 2013-03-14 | Discharge: 2013-03-14 | Disposition: A | Discharge status: Home / Self Care | Payer: 59 | Source: Ambulatory Visit | Attending: Oncology | Admitting: Oncology

## 2013-03-14 ENCOUNTER — Other Ambulatory Visit (HOSPITAL_BASED_OUTPATIENT_CLINIC_OR_DEPARTMENT_OTHER): Payer: 59

## 2013-03-14 DIAGNOSIS — R599 Enlarged lymph nodes, unspecified: Secondary | ICD-10-CM | POA: Insufficient documentation

## 2013-03-14 DIAGNOSIS — R161 Splenomegaly, not elsewhere classified: Secondary | ICD-10-CM | POA: Insufficient documentation

## 2013-03-14 DIAGNOSIS — R911 Solitary pulmonary nodule: Secondary | ICD-10-CM | POA: Insufficient documentation

## 2013-03-14 DIAGNOSIS — C8190 Hodgkin lymphoma, unspecified, unspecified site: Secondary | ICD-10-CM

## 2013-03-14 DIAGNOSIS — R413 Other amnesia: Secondary | ICD-10-CM

## 2013-03-14 DIAGNOSIS — R5383 Other fatigue: Secondary | ICD-10-CM

## 2013-03-14 DIAGNOSIS — R5381 Other malaise: Secondary | ICD-10-CM

## 2013-03-14 DIAGNOSIS — B20 Human immunodeficiency virus [HIV] disease: Secondary | ICD-10-CM

## 2013-03-14 DIAGNOSIS — R160 Hepatomegaly, not elsewhere classified: Secondary | ICD-10-CM | POA: Insufficient documentation

## 2013-03-14 DIAGNOSIS — K7689 Other specified diseases of liver: Secondary | ICD-10-CM | POA: Insufficient documentation

## 2013-03-14 LAB — COMPREHENSIVE METABOLIC PANEL (CC13)
AST: 54 U/L — ABNORMAL HIGH (ref 5–34)
Albumin: 4.1 g/dL (ref 3.5–5.0)
Alkaline Phosphatase: 71 U/L (ref 40–150)
Potassium: 4.4 mEq/L (ref 3.5–5.1)
Sodium: 138 mEq/L (ref 136–145)
Total Bilirubin: 0.34 mg/dL (ref 0.20–1.20)
Total Protein: 8.3 g/dL (ref 6.4–8.3)

## 2013-03-14 LAB — CBC WITH DIFFERENTIAL/PLATELET
EOS%: 0.9 % (ref 0.0–7.0)
MCH: 32.2 pg (ref 27.2–33.4)
MCHC: 35.1 g/dL (ref 32.0–36.0)
MCV: 91.8 fL (ref 79.3–98.0)
MONO%: 6.7 % (ref 0.0–14.0)
RBC: 4.43 10*6/uL (ref 4.20–5.82)
RDW: 13.3 % (ref 11.0–14.6)

## 2013-03-14 MED ORDER — IOHEXOL 300 MG/ML  SOLN
100.0000 mL | Freq: Once | INTRAMUSCULAR | Status: AC | PRN
  Administered 2013-03-14: 100 mL via INTRAVENOUS

## 2013-03-17 ENCOUNTER — Ambulatory Visit (HOSPITAL_BASED_OUTPATIENT_CLINIC_OR_DEPARTMENT_OTHER): Payer: 59 | Admitting: Oncology

## 2013-03-17 ENCOUNTER — Telehealth: Payer: Self-pay | Admitting: Hematology and Oncology

## 2013-03-17 ENCOUNTER — Encounter: Payer: Self-pay | Admitting: Oncology

## 2013-03-17 VITALS — BP 139/85 | HR 71 | Temp 99.5°F | Resp 18 | Ht 72.0 in | Wt 290.4 lb

## 2013-03-17 DIAGNOSIS — B20 Human immunodeficiency virus [HIV] disease: Secondary | ICD-10-CM

## 2013-03-17 DIAGNOSIS — C8190 Hodgkin lymphoma, unspecified, unspecified site: Secondary | ICD-10-CM

## 2013-03-17 DIAGNOSIS — G5790 Unspecified mononeuropathy of unspecified lower limb: Secondary | ICD-10-CM

## 2013-03-17 DIAGNOSIS — I10 Essential (primary) hypertension: Secondary | ICD-10-CM

## 2013-03-17 DIAGNOSIS — C8121 Mixed cellularity classical Hodgkin lymphoma, lymph nodes of head, face, and neck: Secondary | ICD-10-CM

## 2013-03-17 DIAGNOSIS — F4310 Post-traumatic stress disorder, unspecified: Secondary | ICD-10-CM

## 2013-03-17 NOTE — Progress Notes (Signed)
[facility]  [phone] [phone]   OFFICE PROGRESS NOTE   Cc:  KHAN,JABER A, MD  DIAGNOSIS: stage II classical Hodgkins' lymphoma; mixed cellularity type, (at least 3 different areas of disease in bilateral neck).   PAST THERAPY: started on 11/24/2011 ABVD d1, 15 every 4 weeks with Neulasta the day after. His last dose of chemo was administered on 03/01/2012.  He also underwent consolidative radiation.   CURRENT THERAPY:  Watchful observation.   INTERVAL HISTORY: Jon Shannon 47 y.o. male returns to clinic today by himself.  He complained of neuropathy in the fingers and toes; this is stable. He denied anorexia, weight loss, palpable nodes, night sweat.  Wants to begin working out again.   The rest of the 14 point review of system was negative.    Past Medical History  Diagnosis Date  . Hyperlipidemia 08/07/2011  . Hodgkin's lymphoma 11/09/2011  . HIV INFECTION 11/30/2009    no history of opportunistic infection  . HYPERTENSION 11/30/2009  . Hypertriglyceridemia   . PTSD (post-traumatic stress disorder)   . DJD (degenerative joint disease)   . Hepatitis A     resolved.   Marland Kitchen Hx of radiation therapy 04/03/12 -04/29/12    Hodgkin's disease -neck    Past Surgical History  Procedure Laterality Date  . Tonsillectomy      as teenager  . Left cervical node excisional biopsy  10/2011  . Left index finger trauma repair    . Powerport      Current Outpatient Prescriptions  Medication Sig Dispense Refill  . B Complex-C (B-COMPLEX WITH VITAMIN C) tablet Take 1 tablet by mouth daily.      . bisoprolol-hydrochlorothiazide (ZIAC) 5-6.25 MG per tablet Take 1 tablet by mouth every morning.       . Cholecalciferol (VITAMIN D3) 1000 UNITS tablet Take 1,000 Units by mouth daily.        . cyclobenzaprine (FLEXERIL) 10 MG tablet Take 10 mg by mouth.      . diphenhydrAMINE (BENADRYL) 25 MG tablet Take 25 mg by mouth at bedtime as needed. For sleep      .  emtricitabine-tenofovir (TRUVADA) 200-300 MG per tablet Take 1 tablet by mouth daily.  30 tablet  11  . escitalopram (LEXAPRO) 10 MG tablet Take 10 mg by mouth daily.        . fish oil-omega-3 fatty acids 1000 MG capsule Take 1 g by mouth daily.      Marland Kitchen GARLIC OIL PO Take 1 tablet by mouth daily.      Marland Kitchen gemfibrozil (LOPID) 600 MG tablet TAKE 1 TABLET BY MOUTH 2 TIMES DAILY.  60 tablet  11  . ibuprofen (ADVIL,MOTRIN) 800 MG tablet Take by mouth every 8 (eight) hours as needed for pain.      Marland Kitchen loratadine (CLARITIN) 10 MG tablet Take 10 mg by mouth daily.        . raltegravir (ISENTRESS) 400 MG tablet Take 1 tablet (400 mg total) by mouth 2 (two) times daily.  60 tablet  11  . vitamin C (ASCORBIC ACID) 500 MG tablet Take 1,000 mg by mouth daily.       No current facility-administered medications for this visit.    ALLERGIES:  is allergic to shrimp.  REVIEW OF SYSTEMS:  The rest of the 14-point review of system was negative.   Filed Vitals:   03/17/13 0838  BP: 139/85  Pulse: 71  Temp: 99.5 F (37.5 C)  Resp: 18   Wt Readings from Last 3 Encounters:  03/17/13 290 lb 6.4 oz (131.725 kg)  12/30/12 298 lb (135.172 kg)  11/14/12 296 lb 1.6 oz (134.31 kg)   ECOG Performance status: 0-1  PHYSICAL EXAMINATION:  General: Obese male in no acute distress. Eyes: no scleral icterus. ENT: There were no oropharyngeal lesions. Neck was without thyromegaly. Lymphatics: Negative cervical, supraclavicular or axillary adenopathy. Left cervical neck node excisional scar has healed without any erythema, purulent discharge or pain on palpation. I could not appreciate any definite cervical adenopathy. Respiratory: lungs were clear bilaterally without wheezing or crackles. Cardiovascular: Regular rate and rhythm, S1/S2, without murmur, rub or gallop. There was no pedal edema. GI: abdomen was soft, flat, nontender, nondistended, without organomegaly. Muscoloskeletal: no spinal tenderness of palpation of  vertebral spine. Skin exam was without echymosis, petichae. Neuro exam was nonfocal. Patient was able to get on and off exam table without assistance. Gait was normal. Patient was alerted and oriented. Attention was good. Language was appropriate. Mood was normal without depression. Speech was not pressured. Thought content was not tangential.     LABORATORY/RADIOLOGY DATA:  Lab Results  Component Value Date   WBC 6.6 03/14/2013   HGB 14.3 03/14/2013   HCT 40.7 03/14/2013   PLT 227 03/14/2013   GLUCOSE 202* 03/14/2013   CHOL 183 12/16/2012   TRIG 620* 12/16/2012   HDL 25* 12/16/2012   LDLCALC Comment:   Not calculated due to Triglyceride >400. Suggest ordering Direct LDL (Unit Code: 13086).   Total Cholesterol/HDL Ratio:CHD Risk                        Coronary Heart Disease Risk Table                                        Men       Women          1/2 Average Risk              3.4        3.3              Average Risk              5.0        4.4           2X Average Risk              9.6        7.1           3X Average Risk             23.4       11.0 Use the calculated Patient Ratio above and the CHD Risk table  to determine the patient's CHD Risk. ATP III Classification (LDL):       < 100        mg/dL         Optimal      578 - 129     mg/dL         Near or Above Optimal      130 - 159     mg/dL         Borderline High      160 - 189     mg/dL         High       >  190        mg/dL         Very High   1/61/0960   ALKPHOS 71 03/14/2013   ALT 51 03/14/2013   AST 54* 03/14/2013   NA 138 03/14/2013   K 4.4 03/14/2013   CL 98 01/30/2013   CREATININE 1.2 03/14/2013   BUN 12.8 03/14/2013   CO2 28 03/14/2013   INR 0.98 11/20/2011    IMAGING:    *RADIOLOGY REPORT*  Clinical Data: History of Hodgkin's lymphoma.  CT NECK WITH CONTRAST  Technique: Multidetector CT imaging of the neck was performed with  intravenous contrast.  Contrast: OMNIPAQUE IOHEXOL 300 MG/ML SOLN  Comparison: CT 09/16/2012. PET study  03/18/2012.  Findings: There are no enlarged lymph nodes on either side of the  neck. Both parotid glands are normal. Both submandibular glands  are normal. The thyroid gland is normal. Arterial and venous  structures are patent. No other soft tissue lesion.  IMPRESSION:  Negative CT scan of the neck. No evidence of residual or recurrent  lymphoma.  Original Report Authenticated By: Paulina Fusi, M.D.  EXAM:  CT CHEST, ABDOMEN, AND PELVIS WITH CONTRAST  TECHNIQUE:  Multidetector CT imaging of the chest, abdomen and pelvis was  performed following the standard protocol during bolus  administration of intravenous contrast.  CONTRAST: OMNIPAQUE IOHEXOL 300 MG/ML SOLN  COMPARISON: 09/16/2012  FINDINGS:  CT CHEST FINDINGS  No evidence of mediastinal or hilar lymphadenopathy. No other sites  of adenopathy seen within the thorax. No evidence of chest wall  mass.  No evidence of pleural or pericardial effusion. No evidence of  pulmonary infiltrate or central endobronchial lesion. A 5 mm  noncalcified nodule in the posterior left lower lobe on image 43 is  stable, as well as a 4 mm nodule in the left upper lobe on image 22.  No new or enlarging pulmonary nodules or masses are identified. No  suspicious bone lesions identified.  CT ABDOMEN AND PELVIS FINDINGS  Moderate severe hepatic steatosis and hepatomegaly are stable. No  liver masses are identified. Mild splenomegaly is stable with spleen  measuring approximately 15 cm in length. No splenic lesions  identified.  1.5 cm portacaval lymph node is stable in size as well as mild  retroperitoneal lymphadenopathy in the aortocaval and left  periaortic spaces with the largest lymph node measuring 12 mm on  image 76. No new or increased areas of lymphadenopathy are  identified within the abdomen or pelvis.  The pancreas, adrenal glands, and kidneys are normal appearance. No  evidence of hydronephrosis. No evidence of inflammatory  process or  abnormal fluid collections. No evidence of dilated bowel loops or  bowel wall thickening. No suspicious bone lesions identified.  IMPRESSION:  CT CHEST IMPRESSION  Stable chest CT. No evidence of recurrent lymphoma or other active  disease.  CT ABDOMEN AND PELVIS IMPRESSION  Stable mild abdominal retroperitoneal and portacaval  lymphadenopathy. Stable mild splenomegaly.  Stable hepatic steatosis and hepatomegaly.  No new or progressive disease identified within the abdomen or  pelvis.  Electronically Signed  By: Myles Rosenthal  On: 03/14/2013 17:36   ASSESSMENT AND PLAN:   1. HIV: He is on Truvada and Isentress per ID.  2. Hyperlipidemia: He is on gemfibrozil.  3. PTSD: He is on Lexapro with his psychiatrist.  4. Hypertension: Good control with Ziac.  5.  Fatigue: Improving. TSH was normal in Feb 2014. 6.  Hepatic steatosis:  Due to obesity.  I  advised him again on aerobic exercise.  7.  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  Other etiologies:  HIV?  He did not want to take Neurontin. Neuropathy is stable. 8.   Left cervical neck node classical Hodgkins' lymphoma: Stage II, low risk, nonbulky. There was only slightly FDG uptake in the abdomen without definitive adenopathy or FDG uptake.  - s/p 4 cycles of ABVD with complete radiographic response on restaging PET scan in 02/2012.  CT scan today showed that he is again in remission.  - I recommended watchful observation with again CT in about 6 months.  After two years, we'll go on yearly CT.   Patient expressed informed understanding and agreed with the recommendations.    The length of time of the face-to-face encounter was 30 minutes. More than 50% of time was spent counseling and coordination of care.

## 2013-03-17 NOTE — Telephone Encounter (Signed)
Gave pt appt for lab and Md on February 2014, gave pt oral contrast for Ct before MD visit

## 2013-04-23 ENCOUNTER — Ambulatory Visit (INDEPENDENT_AMBULATORY_CARE_PROVIDER_SITE_OTHER): Payer: 59 | Admitting: *Deleted

## 2013-04-23 DIAGNOSIS — Z23 Encounter for immunization: Secondary | ICD-10-CM

## 2013-05-08 ENCOUNTER — Ambulatory Visit
Admission: RE | Admit: 2013-05-08 | Discharge: 2013-05-08 | Disposition: A | Discharge status: Home / Self Care | Payer: 59 | Source: Ambulatory Visit | Attending: Radiation Oncology | Admitting: Radiation Oncology

## 2013-05-08 VITALS — BP 139/80 | HR 76 | Temp 98.3°F | Ht 72.0 in | Wt 277.1 lb

## 2013-05-08 DIAGNOSIS — C8190 Hodgkin lymphoma, unspecified, unspecified site: Secondary | ICD-10-CM

## 2013-05-08 NOTE — Progress Notes (Signed)
Jon Shannon here for follow up after treat to his left neck.  He denies pain but has been having occasional pain in his belly and testicals.  He rates the pain as a 6/10.  He said it started 2-3 weeks ago.  He has a headache today and thinks it is due to his sinuses.  He does have fatigue.  He reports a dry mouth and has a white sore on the tip of his tongue.  He is able to eat both solids and liquids.  He has lost 13 lbs since August.  He denies nausea and poor appetite.  The skin on his left neck is intact.

## 2013-05-11 ENCOUNTER — Encounter: Payer: Self-pay | Admitting: Radiation Oncology

## 2013-05-11 NOTE — Progress Notes (Signed)
Radiation Oncology         [phone]) [phone] ________________________________  Name: Jon Shannon MRN: [id number]  Date: 05/08/2013  DOB: [birth year]  Follow-Up Visit Note  CC: Lise Auer, MD  Victorino December, MD  Diagnosis:   47 year old gentleman with stage IIA mixed cellularity Hodgkin's disease presenting in the neck s/p consolidative Radiotherapy following 4 x ABVD - 04/03/2012-04/29/2012 to 30.6 Gy in 18 fraction in the form of involved site radiotherapy (ISRT) using IMRT with TomoTherapy   Interval Since Last Radiation:  12  months  Narrative:  The patient returns today for routine follow-up.  He has some xerostomia and taste alteration, but no other treatment-related complaints. He is suffering with occasional abdominal complaints.                              ALLERGIES:  is allergic to shrimp.  Meds: Current Outpatient Prescriptions  Medication Sig Dispense Refill  . aspirin 81 MG tablet Take 81 mg by mouth daily.      . B Complex-C (B-COMPLEX WITH VITAMIN C) tablet Take 1 tablet by mouth daily.      . bisoprolol-hydrochlorothiazide (ZIAC) 5-6.25 MG per tablet Take 1 tablet by mouth every morning.       . Cholecalciferol (VITAMIN D3) 1000 UNITS tablet Take 1,000 Units by mouth daily.        . diphenhydrAMINE (BENADRYL) 25 MG tablet Take 25 mg by mouth at bedtime as needed. For sleep      . emtricitabine-tenofovir (TRUVADA) 200-300 MG per tablet Take 1 tablet by mouth daily.  30 tablet  11  . escitalopram (LEXAPRO) 10 MG tablet Take 10 mg by mouth daily.        . famotidine (PEPCID AC) 10 MG chewable tablet Chew 10 mg by mouth 2 (two) times daily.      . fish oil-omega-3 fatty acids 1000 MG capsule Take 1 g by mouth daily.      Marland Kitchen GARLIC OIL PO Take 1 tablet by mouth daily.      Marland Kitchen gemfibrozil (LOPID) 600 MG tablet TAKE 1 TABLET BY MOUTH 2 TIMES DAILY.  60 tablet  11  . ibuprofen (ADVIL,MOTRIN) 800 MG tablet Take by mouth every 8 (eight) hours as needed for pain.      Marland Kitchen loratadine  (CLARITIN) 10 MG tablet Take 10 mg by mouth daily.        . raltegravir (ISENTRESS) 400 MG tablet Take 1 tablet (400 mg total) by mouth 2 (two) times daily.  60 tablet  11  . vitamin C (ASCORBIC ACID) 500 MG tablet Take 1,000 mg by mouth daily.      . cyclobenzaprine (FLEXERIL) 10 MG tablet Take 10 mg by mouth.       No current facility-administered medications for this encounter.    Physical Findings: The patient is in no acute distress. Patient is alert and oriented.  height is 6' (1.829 m) and weight is 277 lb 1.6 oz (125.692 kg). His temperature is 98.3 F (36.8 C). His blood pressure is 139/80 and his pulse is 76. His oxygen saturation is 97%. . Neck is free of adenopathy. Waldeyer's ring is symmetric on direct inspection.  No significant changes.  Lab Findings: Results for Jon Shannon, Jon Shannon (MRN [id number]) as of 05/11/2013 11:44  Ref. Range 09/17/2012 10:53  TSH Latest Range: 0.350-4.500 uIU/mL 3.111    Lab Results  Component Value Date   WBC 6.6 03/14/2013  HGB 14.3 03/14/2013   HCT 40.7 03/14/2013   MCV 91.8 03/14/2013   PLT 227 03/14/2013   Radiographic Findings: 03/14/13 EXAM:  CT CHEST, ABDOMEN, AND PELVIS WITH CONTRAST  TECHNIQUE:  Multidetector CT imaging of the chest, abdomen and pelvis was  performed following the standard protocol during bolus  administration of intravenous contrast.  CONTRAST: OMNIPAQUE IOHEXOL 300 MG/ML SOLN  COMPARISON: 09/16/2012  FINDINGS:  CT CHEST FINDINGS  No evidence of mediastinal or hilar lymphadenopathy. No other sites  of adenopathy seen within the thorax. No evidence of chest wall  mass.  No evidence of pleural or pericardial effusion. No evidence of  pulmonary infiltrate or central endobronchial lesion. A 5 mm  noncalcified nodule in the posterior left lower lobe on image 43 is  stable, as well as a 4 mm nodule in the left upper lobe on image 22.  No new or enlarging pulmonary nodules or masses are identified. No    suspicious bone lesions identified.  CT ABDOMEN AND PELVIS FINDINGS  Moderate severe hepatic steatosis and hepatomegaly are stable. No  liver masses are identified. Mild splenomegaly is stable with spleen  measuring approximately 15 cm in length. No splenic lesions  identified.  1.5 cm portacaval lymph node is stable in size as well as mild  retroperitoneal lymphadenopathy in the aortocaval and left  periaortic spaces with the largest lymph node measuring 12 mm on  image 76. No new or increased areas of lymphadenopathy are  identified within the abdomen or pelvis.  The pancreas, adrenal glands, and kidneys are normal appearance. No  evidence of hydronephrosis. No evidence of inflammatory process or  abnormal fluid collections. No evidence of dilated bowel loops or  bowel wall thickening. No suspicious bone lesions identified.  IMPRESSION:  CT CHEST IMPRESSION  Stable chest CT. No evidence of recurrent lymphoma or other active  disease.  CT ABDOMEN AND PELVIS IMPRESSION  Stable mild abdominal retroperitoneal and portacaval  lymphadenopathy. Stable mild splenomegaly.  Stable hepatic steatosis and hepatomegaly.  No new or progressive disease identified within the abdomen or  pelvis.  Electronically Signed  By: Myles Rosenthal  On: 03/14/2013 17:36   Impression:  The patient has no evidence of recurrence.  Plan:  F/u in 6 months.  _____________________________________  Artist Pais. Kathrynn Running, M.D.

## 2013-05-12 ENCOUNTER — Encounter: Payer: Self-pay | Admitting: Infectious Diseases

## 2013-05-13 ENCOUNTER — Encounter: Payer: Self-pay | Admitting: Oncology

## 2013-05-13 ENCOUNTER — Encounter: Payer: Self-pay | Admitting: Radiation Oncology

## 2013-05-14 ENCOUNTER — Telehealth: Payer: Self-pay | Admitting: *Deleted

## 2013-05-14 NOTE — Telephone Encounter (Signed)
Pt states he weighs 265.5 lbs today on Gym scale.  That is over 20 lb wt loss in 2 months.  He denies any change in appetite,  States eating well. He has increased his fluid intake.  He continues to exercise, but not any more than usual in the past few months.  Denies any other symptoms except some occasional rib pain.   Per Dr. Bertis Ruddy, schedule pt to be seen in office on 10/30 at 2 pm.  Pt notified and POF sent to scheduling to add onto MD schedule.

## 2013-05-15 ENCOUNTER — Telehealth: Payer: Self-pay | Admitting: Hematology and Oncology

## 2013-05-15 ENCOUNTER — Ambulatory Visit: Payer: 59 | Admitting: Radiation Oncology

## 2013-05-15 NOTE — Telephone Encounter (Signed)
sched 10.30.14 appt....pt already aware per pof

## 2013-05-28 ENCOUNTER — Other Ambulatory Visit: Payer: Self-pay | Admitting: *Deleted

## 2013-05-28 DIAGNOSIS — B20 Human immunodeficiency virus [HIV] disease: Secondary | ICD-10-CM

## 2013-05-28 MED ORDER — RALTEGRAVIR POTASSIUM 400 MG PO TABS: 400.0000 mg | ORAL_TABLET | Freq: Two times a day (BID) | ORAL | Status: DC

## 2013-05-28 MED ORDER — EMTRICITABINE-TENOFOVIR DF 200-300 MG PO TABS: 1.0000 | ORAL_TABLET | Freq: Every day | ORAL | Status: DC

## 2013-05-29 ENCOUNTER — Telehealth: Payer: Self-pay | Admitting: *Deleted

## 2013-05-29 ENCOUNTER — Encounter: Payer: Self-pay | Admitting: Hematology and Oncology

## 2013-05-29 ENCOUNTER — Ambulatory Visit (HOSPITAL_BASED_OUTPATIENT_CLINIC_OR_DEPARTMENT_OTHER): Payer: 59 | Admitting: Lab

## 2013-05-29 ENCOUNTER — Telehealth: Payer: Self-pay | Admitting: Hematology and Oncology

## 2013-05-29 ENCOUNTER — Encounter: Payer: 59 | Admitting: Nutrition

## 2013-05-29 ENCOUNTER — Ambulatory Visit (HOSPITAL_BASED_OUTPATIENT_CLINIC_OR_DEPARTMENT_OTHER): Payer: 59 | Admitting: Hematology and Oncology

## 2013-05-29 VITALS — BP 128/85 | HR 76 | Temp 97.0°F | Resp 18 | Ht 72.0 in | Wt 265.0 lb

## 2013-05-29 DIAGNOSIS — C8190 Hodgkin lymphoma, unspecified, unspecified site: Secondary | ICD-10-CM

## 2013-05-29 DIAGNOSIS — C8121 Mixed cellularity classical Hodgkin lymphoma, lymph nodes of head, face, and neck: Secondary | ICD-10-CM

## 2013-05-29 DIAGNOSIS — B20 Human immunodeficiency virus [HIV] disease: Secondary | ICD-10-CM

## 2013-05-29 DIAGNOSIS — N508 Other specified disorders of male genital organs: Secondary | ICD-10-CM

## 2013-05-29 DIAGNOSIS — R634 Abnormal weight loss: Secondary | ICD-10-CM

## 2013-05-29 HISTORY — DX: Abnormal weight loss: R63.4

## 2013-05-29 LAB — CBC WITH DIFFERENTIAL/PLATELET
Basophils Absolute: 0 10*3/uL (ref 0.0–0.1)
EOS%: 1 % (ref 0.0–7.0)
Eosinophils Absolute: 0.1 10*3/uL (ref 0.0–0.5)
HGB: 14.8 g/dL (ref 13.0–17.1)
MCH: 30.7 pg (ref 27.2–33.4)
MONO%: 5.8 % (ref 0.0–14.0)
NEUT#: 2.7 10*3/uL (ref 1.5–6.5)
RBC: 4.83 10*6/uL (ref 4.20–5.82)
RDW: 13 % (ref 11.0–14.6)
lymph#: 2.8 10*3/uL (ref 0.9–3.3)

## 2013-05-29 LAB — COMPREHENSIVE METABOLIC PANEL (CC13)
Albumin: 4 g/dL (ref 3.5–5.0)
Anion Gap: 14 mEq/L — ABNORMAL HIGH (ref 3–11)
BUN: 20.3 mg/dL (ref 7.0–26.0)
CO2: 24 mEq/L (ref 22–29)
Calcium: 10.6 mg/dL — ABNORMAL HIGH (ref 8.4–10.4)
Chloride: 96 mEq/L — ABNORMAL LOW (ref 98–109)
Creatinine: 1.3 mg/dL (ref 0.7–1.3)
Potassium: 4 mEq/L (ref 3.5–5.1)

## 2013-05-29 LAB — T4, FREE: Free T4: 1.03 ng/dL (ref 0.80–1.80)

## 2013-05-29 LAB — LACTATE DEHYDROGENASE (CC13): LDH: 128 U/L (ref 125–245)

## 2013-05-29 LAB — TSH CHCC: TSH: 2.638 m(IU)/L (ref 0.320–4.118)

## 2013-05-29 LAB — HEPATITIS B SURFACE ANTIGEN: Hepatitis B Surface Ag: NEGATIVE

## 2013-05-29 NOTE — Telephone Encounter (Signed)
Gave pt appt for lab and MD for November, sent pt to labs today

## 2013-05-29 NOTE — Telephone Encounter (Signed)
I am doubtful tape worm to be the cause I will review his scans with him in his next visit. If unrevealing, he may need a colonoscopy

## 2013-05-29 NOTE — Telephone Encounter (Signed)
Pt left VM asking if Dr. Bertis Shannon thinks pt could possibly have a tape worm?  Asks if this could cause his weight loss and abd pain?

## 2013-05-29 NOTE — Progress Notes (Signed)
[facility] OFFICE PROGRESS NOTE  Patient Care Team: Lise Auer as PCP - General (Family Medicine) Ginnie Smart, MD as PCP - Infectious Diseases (Infectious Diseases) Christia Reading, MD (Otolaryngology) Lise Auer as Referring Physician (Family Medicine)  DIAGNOSIS: History of Hodgkin lymphoma for further management  SUMMARY OF ONCOLOGIC HISTORY: This is the patient's was diagnosed with classical Hodgkin lymphoma, stage IIA last year after presenting initially with palpable lymphadenopathy in his neck. The patient was treated with several cycles of ABVD from April 2013 to August 2013. Subsequently he underwent consolidation radiation treatment. His last CT scan from August 2014 showed no evidence of disease recurrence.  INTERVAL HISTORY: Jon Shannon 47 y.o. male returns for further followup. He is not doing well. He denies any new lymphadenopathy. However he is very concerned about progressive weight loss. Over the last month or so, he has lost more than 10 pounds of weight for unknown reasons. He denies any anorexia. No night sweats. He also felt a lump in his left scrotum several weeks ago which she thought was a cyst. He tried to express a lump and noticed some blood tinged discharge from it.  I have reviewed the past medical history, past surgical history, social history and family history with the patient and they are unchanged from previous note.  ALLERGIES:  is allergic to shrimp.  MEDICATIONS:  Current Outpatient Prescriptions  Medication Sig Dispense Refill  . aspirin 81 MG tablet Take 81 mg by mouth daily.      . B Complex-C (B-COMPLEX WITH VITAMIN C) tablet Take 1 tablet by mouth daily.      . bisoprolol-hydrochlorothiazide (ZIAC) 5-6.25 MG per tablet Take 1 tablet by mouth every morning.       . Cholecalciferol (VITAMIN D3) 1000 UNITS tablet Take 1,000 Units by mouth daily.        . cyclobenzaprine (FLEXERIL) 10 MG tablet Take 10 mg by mouth.      .  diphenhydrAMINE (BENADRYL) 25 MG tablet Take 25 mg by mouth at bedtime as needed. For sleep      . emtricitabine-tenofovir (TRUVADA) 200-300 MG per tablet Take 1 tablet by mouth daily.  30 tablet  6  . escitalopram (LEXAPRO) 10 MG tablet Take 10 mg by mouth daily.        . famotidine (PEPCID AC) 10 MG chewable tablet Chew 10 mg by mouth 2 (two) times daily.      . fish oil-omega-3 fatty acids 1000 MG capsule Take 1 g by mouth daily.      Marland Kitchen GARLIC OIL PO Take 1 tablet by mouth daily.      Marland Kitchen gemfibrozil (LOPID) 600 MG tablet TAKE 1 TABLET BY MOUTH 2 TIMES DAILY.  60 tablet  11  . ibuprofen (ADVIL,MOTRIN) 800 MG tablet Take by mouth every 8 (eight) hours as needed for pain.      Marland Kitchen loratadine (CLARITIN) 10 MG tablet Take 10 mg by mouth daily.        . raltegravir (ISENTRESS) 400 MG tablet Take 1 tablet (400 mg total) by mouth 2 (two) times daily.  60 tablet  6  . vitamin C (ASCORBIC ACID) 500 MG tablet Take 1,000 mg by mouth daily.       No current facility-administered medications for this visit.    REVIEW OF SYSTEMS:   Constitutional: Denies fevers, chills Eyes: Denies blurriness of vision Ears, nose, mouth, throat, and face: Denies mucositis or sore throat Respiratory: Denies cough, dyspnea or  wheezes Cardiovascular: Denies palpitation, chest discomfort or lower extremity swelling Gastrointestinal:  Denies nausea, heartburn or change in bowel habits Skin: Denies abnormal skin rashes Neurological:Denies numbness, tingling or new weaknesses Behavioral/Psych: Mood is stable, no new changes  All other systems were reviewed with the patient and are negative.  PHYSICAL EXAMINATION: ECOG PERFORMANCE STATUS: 1 - Symptomatic but completely ambulatory  Filed Vitals:   05/29/13 1412  BP: 128/85  Pulse: 76  Temp: 97 F (36.1 C)  Resp: 18   Filed Weights   05/29/13 1412  Weight: 265 lb (120.203 kg)    GENERAL:alert, no distress and comfortable. Patient is morbidly obese SKIN: skin  color, texture, turgor are normal, no rashes or significant lesions EYES: normal, Conjunctiva are pink and non-injected, sclera clear OROPHARYNX:no exudate, no erythema and lips, buccal mucosa, and tongue normal  NECK: supple, thyroid normal size, non-tender, without nodularity LYMPH:  no palpable lymphadenopathy in the cervical, axillary or inguinal LUNGS: clear to auscultation and percussion with normal breathing effort HEART: regular rate & rhythm and no murmurs and no lower extremity edema ABDOMEN:abdomen soft, non-tender and normal bowel sounds. Unable to appreciate splenomegaly due to morbid obesity Musculoskeletal:no cyanosis of digits and no clubbing  NEURO: alert & oriented x 3 with fluent speech, no focal motor/sensory deficits External genitalia look normal. I am not able to appreciate a lump in his left scrotum  LABORATORY DATA:  I have reviewed the data as listed    Component Value Date/Time   NA 134* 05/29/2013 1459   NA 137 01/30/2013 0925   K 4.0 05/29/2013 1459   K 4.6 01/30/2013 0925   CL 98 01/30/2013 0925   CL 100 09/16/2012 1514   CO2 24 05/29/2013 1459   CO2 28 01/30/2013 0925   GLUCOSE 359* 05/29/2013 1459   GLUCOSE 224* 01/30/2013 0925   GLUCOSE 116* 09/16/2012 1514   BUN 20.3 05/29/2013 1459   BUN 13 01/30/2013 0925   CREATININE 1.3 05/29/2013 1459   CREATININE 0.97 01/30/2013 0925   CREATININE 0.90 03/18/2012 0937   CALCIUM 10.6* 05/29/2013 1459   CALCIUM 9.8 01/30/2013 0925   PROT 8.7* 05/29/2013 1459   PROT 7.2 01/30/2013 0925   ALBUMIN 4.0 05/29/2013 1459   ALBUMIN 4.7 01/30/2013 0925   AST 34 05/29/2013 1459   AST 37 01/30/2013 0925   ALT 41 05/29/2013 1459   ALT 55* 01/30/2013 0925   ALKPHOS 81 05/29/2013 1459   ALKPHOS 61 01/30/2013 0925   BILITOT 0.43 05/29/2013 1459   BILITOT 0.5 01/30/2013 0925    No results found for this basename: SPEP, UPEP,  kappa and lambda light chains    Lab Results  Component Value Date   WBC 5.9 05/29/2013   NEUTROABS 2.7 05/29/2013    HGB 14.8 05/29/2013   HCT 43.0 05/29/2013   MCV 89.1 05/29/2013   PLT 241 05/29/2013      Chemistry      Component Value Date/Time   NA 134* 05/29/2013 1459   NA 137 01/30/2013 0925   K 4.0 05/29/2013 1459   K 4.6 01/30/2013 0925   CL 98 01/30/2013 0925   CL 100 09/16/2012 1514   CO2 24 05/29/2013 1459   CO2 28 01/30/2013 0925   BUN 20.3 05/29/2013 1459   BUN 13 01/30/2013 0925   CREATININE 1.3 05/29/2013 1459   CREATININE 0.97 01/30/2013 0925   CREATININE 0.90 03/18/2012 0937   GLU 286* 11/24/2011 1446      Component Value Date/Time  CALCIUM 10.6* 05/29/2013 1459   CALCIUM 9.8 01/30/2013 0925   ALKPHOS 81 05/29/2013 1459   ALKPHOS 61 01/30/2013 0925   AST 34 05/29/2013 1459   AST 37 01/30/2013 0925   ALT 41 05/29/2013 1459   ALT 55* 01/30/2013 0925   BILITOT 0.43 05/29/2013 1459   BILITOT 0.5 01/30/2013 0925       RADIOGRAPHIC STUDIES: I have personally reviewed the radiological images as listed and agreed with the findings in the report. His last CT scan from August were reviewed which show no evidence of disease recurrence   ASSESSMENT:  #1 history of Hodgkin lymphoma #2 progressive weight loss #3 recent lump in the left scrotum #4 HIV infection  PLAN:  I am concerned about possible disease recurrence. On the background history of HIV infection, the patient would be at risk of getting other opportunistic infection or high-grade lymphoma. I recommend PET/CT scan as soon as possible to rule out recurrence of disease. I will forward additional blood work today to rule out disease recurrence.  Orders Placed This Encounter  Procedures  . NM PET Image Initial (PI) Skull Base To Thigh    Standing Status: Future     Number of Occurrences:      Standing Expiration Date: 05/29/2014    Order Specific Question:  Reason for Exam (SYMPTOM  OR DIAGNOSIS REQUIRED)    Answer:  Hx lymphoma, now with weight loss, recent new lump in left scrotum, Hx HIV inf    Order Specific Question:   Preferred imaging location?    Answer:  [facility]  . Comprehensive metabolic panel    Standing Status: Future     Number of Occurrences: 1     Standing Expiration Date: 05/29/2014  . CBC with Differential    Standing Status: Future     Number of Occurrences: 1     Standing Expiration Date: 02/18/2014  . Lactate dehydrogenase    Standing Status: Future     Number of Occurrences: 1     Standing Expiration Date: 05/29/2014  . TSH    Standing Status: Future     Number of Occurrences: 1     Standing Expiration Date: 05/29/2014  . T4, free    Standing Status: Future     Number of Occurrences: 1     Standing Expiration Date: 05/29/2014  . Hepatitis B core antibody, IgM    Standing Status: Future     Number of Occurrences: 1     Standing Expiration Date: 05/29/2014  . Hepatitis B surface antibody    Standing Status: Future     Number of Occurrences: 1     Standing Expiration Date: 05/29/2014  . Hepatitis B surface antigen    Standing Status: Future     Number of Occurrences: 1     Standing Expiration Date: 05/29/2014   All questions were answered. The patient knows to call the clinic with any problems, questions or concerns. No barriers to learning was detected.   [facility], Areal Cochrane, MD 05/29/2013 7:03 PM

## 2013-05-30 ENCOUNTER — Ambulatory Visit: Payer: 59 | Admitting: Nutrition

## 2013-05-30 NOTE — Progress Notes (Signed)
Patient is a 47 year old male with history of Hodgkin's lymphoma.  Past medical history also includes HIV, hyperlipidemia, PTSD, hypertension, hepatitis.  Medications include B complex vitamins, vitamin D3, Lexapro, omega-3 fatty acids, Lopid, garlic oil, and vitamin C.  Labs include sodium 134, glucose 359, triglycerides 620, and TSH 2.638.  These were done on October 30.  Height: 6 feet 0 inch. Weight: 265 pounds. Usual body weight: 290 pounds documented 03/17/2013. BMI: 35.93.  Patient complains of fatigue.  Patient denies change in oral intake or exercise over the past 3 months.  He has been going to the gym 3-4 days weekly.  He typically lifts weights and reports he completes a cardiovascular workout  approximately 25 minutes about 4 days a week.  Dietary recall reveals patient is consuming 3 meals with one to 2 snacks daily.  Total calories consumed estimated at approximately 3000 calories on day of recall.  Patient has had a 25 pound weight loss since August 18.  He denies he has ever been diagnosed with diabetes.  Patient did report he was on medication during last chemotherapy treatment for elevated blood sugars.  He no longer takes "diabetic medication".  Nutrition diagnosis: Unintended weight loss related to impaired nutrient utilization as evidenced by 9% weight loss over 2 months and glucose level of 359.  Intervention: Patient educated on carbohydrate-controlled diet.  Recommended patient discontinue sweetened beverages and highly concentrated sweetened desserts.  Recommended portion control, especially carbohydrate containing foods.  Referred patient to primary physician for evaluation of hyperglycemia.  I provided fact sheet for patient to take with him.  I answered his questions. Teach back method used.  Monitoring, evaluation, goals: Patient will decrease simple carbohydrates and followup with primary M.D. for further evaluation of elevated blood sugar levels.  Next visit:  Patient has my contact information for questions regarding diet.

## 2013-06-02 ENCOUNTER — Encounter: Payer: Self-pay | Admitting: Hematology and Oncology

## 2013-06-02 ENCOUNTER — Telehealth: Payer: Self-pay | Admitting: *Deleted

## 2013-06-02 NOTE — Telephone Encounter (Signed)
Returning pt's phone call from last week,  He was asking if he could have a tape worm?  He also sent email asking about test results.  Pt reports he has seen his PCP for his elevated blood sugar and they did a lot of lab work on him.  He sees PCP again on 11/11 and then Dr. Bertis Ruddy on 11/12 next week.  Instructed him to have PCP either fax over lab results or bring them with him on next visit.  Informed him Dr. Bertis Ruddy said she would refer pt to GI doctor if his PET scan is unrevealing, but she says it is unlikely he has a tape worm.  Did inform pt his CBC was wnl and his thyroid levels were also wnl last week.  He is scheduled for PET on 11/7 and asks if he needs to fast for this test?   Instructed pt to call Radiology for instructions.  He verbalized understanding.

## 2013-06-04 ENCOUNTER — Encounter: Payer: Self-pay | Admitting: Hematology and Oncology

## 2013-06-04 ENCOUNTER — Telehealth: Payer: Self-pay | Admitting: *Deleted

## 2013-06-04 NOTE — Telephone Encounter (Signed)
Pt left a message stating his A1C was 12.9. MD at [facility] has prescribed Metformin 500 mg bid. They want to know if this will be a problem with any of his current meds.Marland KitchenMarland Kitchen

## 2013-06-05 ENCOUNTER — Encounter: Payer: Self-pay | Admitting: Hematology and Oncology

## 2013-06-05 NOTE — Progress Notes (Signed)
Pet scan 06/05/13 approved [id number] 06/05/13-07/20/13.

## 2013-06-06 ENCOUNTER — Encounter (HOSPITAL_COMMUNITY): Payer: Self-pay

## 2013-06-06 ENCOUNTER — Ambulatory Visit (HOSPITAL_COMMUNITY)
Admission: RE | Admit: 2013-06-06 | Discharge: 2013-06-06 | Disposition: A | Discharge status: Home / Self Care | Payer: 59 | Source: Ambulatory Visit | Attending: Hematology and Oncology | Admitting: Hematology and Oncology

## 2013-06-06 DIAGNOSIS — N508 Other specified disorders of male genital organs: Secondary | ICD-10-CM | POA: Insufficient documentation

## 2013-06-06 DIAGNOSIS — C8190 Hodgkin lymphoma, unspecified, unspecified site: Secondary | ICD-10-CM

## 2013-06-06 DIAGNOSIS — K7689 Other specified diseases of liver: Secondary | ICD-10-CM | POA: Insufficient documentation

## 2013-06-06 DIAGNOSIS — Z21 Asymptomatic human immunodeficiency virus [HIV] infection status: Secondary | ICD-10-CM | POA: Insufficient documentation

## 2013-06-06 DIAGNOSIS — Z87898 Personal history of other specified conditions: Secondary | ICD-10-CM | POA: Insufficient documentation

## 2013-06-06 MED ORDER — FLUDEOXYGLUCOSE F - 18 (FDG) INJECTION
20.1000 | Freq: Once | INTRAVENOUS | Status: AC | PRN
  Administered 2013-06-06: 20.1 via INTRAVENOUS

## 2013-06-10 ENCOUNTER — Telehealth: Payer: Self-pay | Admitting: Nutrition

## 2013-06-10 ENCOUNTER — Other Ambulatory Visit: Payer: Self-pay | Admitting: Hematology and Oncology

## 2013-06-10 DIAGNOSIS — C8190 Hodgkin lymphoma, unspecified, unspecified site: Secondary | ICD-10-CM

## 2013-06-10 NOTE — Telephone Encounter (Signed)
Patient called reporting he was diagnosed with diabetes.  He is on 2 oral medications prescribed by his primary physician.  He has questions about foods that are permitted.  I did answered his diabetic diet questions.  However, patient does require further diabetic education.  He was referred to his physician for referral to [facility].

## 2013-06-11 ENCOUNTER — Ambulatory Visit: Payer: 59 | Admitting: Hematology and Oncology

## 2013-06-11 ENCOUNTER — Telehealth: Payer: Self-pay | Admitting: Hematology and Oncology

## 2013-06-11 NOTE — Telephone Encounter (Signed)
s.w. pt and advised on May appt for 2015...pt ok and aware

## 2013-06-18 ENCOUNTER — Other Ambulatory Visit: Payer: 59

## 2013-06-18 DIAGNOSIS — B20 Human immunodeficiency virus [HIV] disease: Secondary | ICD-10-CM

## 2013-06-18 DIAGNOSIS — Z113 Encounter for screening for infections with a predominantly sexual mode of transmission: Secondary | ICD-10-CM

## 2013-06-18 LAB — CBC WITH DIFFERENTIAL/PLATELET
Basophils Relative: 0 % (ref 0–1)
Eosinophils Absolute: 0.1 10*3/uL (ref 0.0–0.7)
Eosinophils Relative: 1 % (ref 0–5)
Hemoglobin: 14.7 g/dL (ref 13.0–17.0)
Lymphs Abs: 2.5 10*3/uL (ref 0.7–4.0)
MCH: 30.9 pg (ref 26.0–34.0)
MCHC: 35.3 g/dL (ref 30.0–36.0)
MCV: 87.4 fL (ref 78.0–100.0)
Monocytes Absolute: 0.3 10*3/uL (ref 0.1–1.0)
Monocytes Relative: 6 % (ref 3–12)
Neutrophils Relative %: 49 % (ref 43–77)
RBC: 4.76 MIL/uL (ref 4.22–5.81)

## 2013-06-18 NOTE — Addendum Note (Signed)
Addended by: Mariea Clonts D on: 06/18/2013 04:52 PM   Modules accepted: Orders

## 2013-06-19 ENCOUNTER — Other Ambulatory Visit: Payer: 59

## 2013-06-19 DIAGNOSIS — B20 Human immunodeficiency virus [HIV] disease: Secondary | ICD-10-CM

## 2013-06-19 LAB — T-HELPER CELL (CD4) - (RCID CLINIC ONLY)
CD4 % Helper T Cell: 26 % — ABNORMAL LOW (ref 33–55)
CD4 T Cell Abs: 750 /uL (ref 400–2700)

## 2013-06-20 LAB — COMPREHENSIVE METABOLIC PANEL
ALT: 54 U/L — ABNORMAL HIGH (ref 0–53)
AST: 41 U/L — ABNORMAL HIGH (ref 0–37)
Albumin: 4.5 g/dL (ref 3.5–5.2)
Alkaline Phosphatase: 49 U/L (ref 39–117)
Calcium: 9.7 mg/dL (ref 8.4–10.5)
Chloride: 101 mEq/L (ref 96–112)
Creat: 1.05 mg/dL (ref 0.50–1.35)
Potassium: 3.9 mEq/L (ref 3.5–5.3)
Total Protein: 7.3 g/dL (ref 6.0–8.3)

## 2013-07-02 ENCOUNTER — Encounter: Payer: Self-pay | Admitting: Infectious Diseases

## 2013-07-02 ENCOUNTER — Ambulatory Visit (INDEPENDENT_AMBULATORY_CARE_PROVIDER_SITE_OTHER): Payer: 59 | Admitting: Infectious Diseases

## 2013-07-02 VITALS — BP 106/70 | Temp 98.0°F | Wt 265.4 lb

## 2013-07-02 DIAGNOSIS — C8190 Hodgkin lymphoma, unspecified, unspecified site: Secondary | ICD-10-CM

## 2013-07-02 DIAGNOSIS — B20 Human immunodeficiency virus [HIV] disease: Secondary | ICD-10-CM

## 2013-07-02 DIAGNOSIS — E119 Type 2 diabetes mellitus without complications: Secondary | ICD-10-CM

## 2013-07-02 DIAGNOSIS — Z113 Encounter for screening for infections with a predominantly sexual mode of transmission: Secondary | ICD-10-CM

## 2013-07-02 DIAGNOSIS — Z79899 Other long term (current) drug therapy: Secondary | ICD-10-CM

## 2013-07-02 MED ORDER — CANAGLIFLOZIN 100 MG PO TABS: 1.0000 | ORAL_TABLET | Freq: Every morning | ORAL | Status: DC

## 2013-07-02 NOTE — Assessment & Plan Note (Signed)
He appears to be improving. Encouraged him to lose wt. Will f/u with PCP.

## 2013-07-02 NOTE — Assessment & Plan Note (Signed)
His most recent screening was (-). Greatly appreciate their f/u.

## 2013-07-02 NOTE — Progress Notes (Signed)
   Subjective:    Patient ID: Jon Shannon, male    DOB: 12-23-1965, 47 y.o.   MRN: [id number]  HPI 47 yo M with HIV+, stage IIa Hodgkin's lymphoma. He was recently seen by onc for ? Of recurrence, wt loss (~26#). He had a PET that was (-) and CT scan that also did not show recurrence.  Newly Dx with DM as well. No polyuria, vision has decreased in last 1-2 months. L > R. Started on metformin.  Has sores on R 1st and 2nd toes from blisters. States he stumbles a lot, attributes to his DM.   HIV 1 RNA Quant (copies/mL)  Date Value  06/18/2013 <20   01/30/2013 <20   12/16/2012 <20      CD4 T Cell Abs (/uL)  Date Value  06/18/2013 750   01/30/2013 680   12/16/2012 640    Review of Systems  Constitutional: Positive for unexpected weight change. Negative for appetite change.  Respiratory: Negative for shortness of breath.   Cardiovascular: Negative for chest pain.  Gastrointestinal: Negative for diarrhea and constipation.  Endocrine: Positive for polyuria.  Genitourinary: Negative for difficulty urinating.  Neurological: Positive for numbness.  had numbness in feet since CTX.      Objective:   Physical Exam  Constitutional: He appears well-developed and well-nourished.  HENT:  Mouth/Throat: No oropharyngeal exudate.  Eyes: EOM are normal. Pupils are equal, round, and reactive to light.  Neck: Neck supple.  Cardiovascular: Normal rate, regular rhythm and normal heart sounds.   Pulmonary/Chest: Effort normal and breath sounds normal.  Abdominal: Soft. Bowel sounds are normal. He exhibits no distension. There is no tenderness.  Musculoskeletal: He exhibits no edema.       Feet:  Lymphadenopathy:    He has no cervical adenopathy.          Assessment & Plan:

## 2013-07-02 NOTE — Assessment & Plan Note (Addendum)
Doing very well. vax are up to date, hep A and B immune. Not sexually active. Will see him back in 6 months.

## 2013-07-08 ENCOUNTER — Other Ambulatory Visit: Payer: Self-pay | Admitting: Infectious Diseases

## 2013-07-15 ENCOUNTER — Encounter: Payer: Self-pay | Admitting: Infectious Diseases

## 2013-08-14 ENCOUNTER — Telehealth: Payer: Self-pay | Admitting: *Deleted

## 2013-08-14 ENCOUNTER — Encounter: Payer: Self-pay | Admitting: Hematology and Oncology

## 2013-08-14 ENCOUNTER — Encounter: Payer: Self-pay | Admitting: *Deleted

## 2013-08-14 NOTE — Telephone Encounter (Signed)
Called patient to inquire about date of service and name of lab test being denied.  Left message with this request requesting he cal to give information to financial team.  Awaiting return call or Mychart  Reply from patient.

## 2013-08-15 ENCOUNTER — Encounter: Payer: Self-pay | Admitting: Hematology and Oncology

## 2013-08-15 NOTE — Progress Notes (Signed)
I spoke with patient and he sent copy of bill for labs and he said they advised him need proof that the labs were necessary. [employer] is requesting or they will not pay [facility] lab bill. I left message for nurse to get with patient and/or dr and how it works for dr to give proof(letter or medical notes) for insurance to pay claim.

## 2013-08-25 ENCOUNTER — Encounter: Payer: Self-pay | Admitting: Hematology and Oncology

## 2013-08-25 ENCOUNTER — Other Ambulatory Visit: Payer: Self-pay | Admitting: Hematology and Oncology

## 2013-08-27 ENCOUNTER — Encounter: Payer: Self-pay | Admitting: Hematology and Oncology

## 2013-09-04 ENCOUNTER — Other Ambulatory Visit: Payer: Self-pay | Admitting: Hematology and Oncology

## 2013-09-04 ENCOUNTER — Encounter: Payer: Self-pay | Admitting: Hematology and Oncology

## 2013-09-04 ENCOUNTER — Telehealth: Payer: Self-pay | Admitting: Hematology and Oncology

## 2013-09-04 ENCOUNTER — Other Ambulatory Visit: Payer: Self-pay | Admitting: *Deleted

## 2013-09-04 DIAGNOSIS — K625 Hemorrhage of anus and rectum: Secondary | ICD-10-CM

## 2013-09-04 HISTORY — DX: Hemorrhage of anus and rectum: K62.5

## 2013-09-04 NOTE — Telephone Encounter (Signed)
PT STATES THAT HE WAS SENT AN EMAIL REGaRDING THAT HE NEEDS TO SEE HIS PRIMARY CARE MD FIRST BEFORE SEEING A GI MD SINCE HIS PRIMARY CARE MD CAN FEEL FOR LUMPS AND ANY ABNORMALITIES. THE PT IS AWARE TO CONTACT us WITH ANY FINDINGS AND LET us KNOW IF HE WANTS TO GO FURTHER WITH THIS . WILL NOT SCHEDULE THE GI EVAL UNTIL THE PT CONTACTS [place] .

## 2013-09-16 ENCOUNTER — Other Ambulatory Visit (HOSPITAL_COMMUNITY): Payer: 59

## 2013-09-16 ENCOUNTER — Other Ambulatory Visit: Payer: 59 | Admitting: Lab

## 2013-09-17 ENCOUNTER — Ambulatory Visit: Payer: 59

## 2013-09-22 ENCOUNTER — Encounter: Payer: Self-pay | Admitting: Hematology and Oncology

## 2013-10-07 ENCOUNTER — Encounter: Payer: Self-pay | Admitting: Hematology and Oncology

## 2013-11-06 ENCOUNTER — Ambulatory Visit
Admission: RE | Admit: 2013-11-06 | Discharge: 2013-11-06 | Disposition: A | Discharge status: Home / Self Care | Payer: 59 | Source: Ambulatory Visit | Attending: Radiation Oncology | Admitting: Radiation Oncology

## 2013-11-06 ENCOUNTER — Encounter: Payer: Self-pay | Admitting: Radiation Oncology

## 2013-11-06 VITALS — BP 132/77 | HR 66 | Temp 98.5°F | Wt 263.8 lb

## 2013-11-06 DIAGNOSIS — C8190 Hodgkin lymphoma, unspecified, unspecified site: Secondary | ICD-10-CM

## 2013-11-06 NOTE — Progress Notes (Signed)
Radiation Oncology         [phone]) [phone] ________________________________  Name: Jon Shannon MRN: [id number]  Date: 11/06/2013  DOB: [birth year]  Follow-Up Visit Note  CC: [name], MD  [name], MD  Diagnosis:   48 year old gentleman with stage IIA mixed cellularity Hodgkin's disease presenting in the neck s/p consolidative Radiotherapy following 4 x ABVD - 04/03/2012-04/29/2012 to 30.6 Gy in 18 fraction in the form of involved site radiotherapy (ISRT) using IMRT with TomoTherapy     Interval Since Last Radiation:  18  months  Narrative:  The patient returns today for routine follow-up.  He denies pain.  No problems with swallowing.  Newly diagnosed diabetic type 11 since October 2014.No scans since last visit.Last TSH May 29, 2013                               ALLERGIES:  is allergic to shrimp.  Meds: Current Outpatient Prescriptions  Medication Sig Dispense Refill  . aspirin 81 MG tablet Take 81 mg by mouth daily.      . B Complex-C (B-COMPLEX WITH VITAMIN C) tablet Take 1 tablet by mouth daily.      . bisoprolol-hydrochlorothiazide (ZIAC) 5-6.25 MG per tablet Take 1 tablet by mouth every morning.       . Canagliflozin 100 MG TABS Take 1 tablet (100 mg total) by mouth every morning.  30 tablet  6  . Cholecalciferol (VITAMIN D3) 1000 UNITS tablet Take 1,000 Units by mouth daily.        Marland Kitchen DHEA 10 MG CAPS Take by mouth.      . diphenhydrAMINE (BENADRYL) 25 MG tablet Take 25 mg by mouth at bedtime as needed. For sleep      . emtricitabine-tenofovir (TRUVADA) 200-300 MG per tablet Take 1 tablet by mouth daily.  30 tablet  6  . escitalopram (LEXAPRO) 10 MG tablet Take 10 mg by mouth daily.        . famotidine (PEPCID AC) 10 MG chewable tablet Chew 10 mg by mouth 2 (two) times daily.      . fish oil-omega-3 fatty acids 1000 MG capsule Take 1 g by mouth daily.      Marland Kitchen GARLIC OIL PO Take 1 tablet by mouth daily.      Marland Kitchen ibuprofen (ADVIL,MOTRIN) 800 MG tablet Take by mouth every  8 (eight) hours as needed for pain.      Marland Kitchen loratadine (CLARITIN) 10 MG tablet Take 10 mg by mouth daily.        . metFORMIN (GLUCOPHAGE) 500 MG tablet Take 500 mg by mouth 2 (two) times daily with a meal.      . raltegravir (ISENTRESS) 400 MG tablet Take 1 tablet (400 mg total) by mouth 2 (two) times daily.  60 tablet  6  . vitamin C (ASCORBIC ACID) 500 MG tablet Take 1,000 mg by mouth daily.      . propranolol (INNOPRAN XL) 120 MG 24 hr capsule Take 120 mg by mouth at bedtime.       No current facility-administered medications for this encounter.    Physical Findings: The patient is in no acute distress. Patient is alert and oriented.  weight is 263 lb 12.8 oz (119.659 kg). His temperature is 98.5 F (36.9 C). His blood pressure is 132/77 and his pulse is 66. His oxygen saturation is 96%. .  No significant changes.  Lab Findings: Lab Results  Component  Value Date   WBC 5.6 06/18/2013   HGB 14.7 06/18/2013   HCT 41.6 06/18/2013   MCV 87.4 06/18/2013   PLT 273 06/18/2013    Impression:  The patient is NED.  He has some xerostomia, no other post-radiation adverse effects  Plan:  Follow-up in one year.  _____________________________________  Sheral Apley. Tammi Klippel, M.D.

## 2013-11-06 NOTE — Progress Notes (Signed)
Patient here for routine follow up completion of radiation to neck in September 2013.Denies pain.No problems with swallowing.Newly diagnosed diabetic type 11 since October 2014.No scans since last visit.Last TSH May 29, 2013 .

## 2013-12-03 ENCOUNTER — Ambulatory Visit (HOSPITAL_BASED_OUTPATIENT_CLINIC_OR_DEPARTMENT_OTHER): Payer: 59 | Admitting: Hematology and Oncology

## 2013-12-03 ENCOUNTER — Encounter: Payer: Self-pay | Admitting: Infectious Diseases

## 2013-12-03 ENCOUNTER — Other Ambulatory Visit (HOSPITAL_BASED_OUTPATIENT_CLINIC_OR_DEPARTMENT_OTHER): Payer: 59

## 2013-12-03 VITALS — BP 124/79 | HR 71 | Temp 98.6°F | Resp 20 | Ht 72.0 in | Wt 267.8 lb

## 2013-12-03 DIAGNOSIS — C8121 Mixed cellularity classical Hodgkin lymphoma, lymph nodes of head, face, and neck: Secondary | ICD-10-CM

## 2013-12-03 DIAGNOSIS — B20 Human immunodeficiency virus [HIV] disease: Secondary | ICD-10-CM

## 2013-12-03 DIAGNOSIS — C8190 Hodgkin lymphoma, unspecified, unspecified site: Secondary | ICD-10-CM

## 2013-12-03 LAB — CBC WITH DIFFERENTIAL/PLATELET
BASO%: 0.2 % (ref 0.0–2.0)
BASOS ABS: 0 10*3/uL (ref 0.0–0.1)
EOS%: 0.9 % (ref 0.0–7.0)
Eosinophils Absolute: 0.1 10*3/uL (ref 0.0–0.5)
HCT: 46.2 % (ref 38.4–49.9)
HEMOGLOBIN: 15.4 g/dL (ref 13.0–17.1)
LYMPH#: 2.7 10*3/uL (ref 0.9–3.3)
LYMPH%: 26.7 % (ref 14.0–49.0)
MCH: 29.8 pg (ref 27.2–33.4)
MCHC: 33.3 g/dL (ref 32.0–36.0)
MCV: 89.4 fL (ref 79.3–98.0)
MONO#: 0.6 10*3/uL (ref 0.1–0.9)
MONO%: 5.6 % (ref 0.0–14.0)
NEUT#: 6.6 10*3/uL — ABNORMAL HIGH (ref 1.5–6.5)
NEUT%: 66.6 % (ref 39.0–75.0)
Platelets: 249 10*3/uL (ref 140–400)
RBC: 5.17 10*6/uL (ref 4.20–5.82)
RDW: 14 % (ref 11.0–14.6)
WBC: 9.9 10*3/uL (ref 4.0–10.3)

## 2013-12-03 LAB — COMPREHENSIVE METABOLIC PANEL (CC13)
ALT: 29 U/L (ref 0–55)
ANION GAP: 11 meq/L (ref 3–11)
AST: 22 U/L (ref 5–34)
Albumin: 4.2 g/dL (ref 3.5–5.0)
Alkaline Phosphatase: 61 U/L (ref 40–150)
BILIRUBIN TOTAL: 0.39 mg/dL (ref 0.20–1.20)
BUN: 20.1 mg/dL (ref 7.0–26.0)
CO2: 24 meq/L (ref 22–29)
CREATININE: 1.1 mg/dL (ref 0.7–1.3)
Calcium: 10.3 mg/dL (ref 8.4–10.4)
Chloride: 106 mEq/L (ref 98–109)
GLUCOSE: 147 mg/dL — AB (ref 70–140)
Potassium: 3.8 mEq/L (ref 3.5–5.1)
Sodium: 141 mEq/L (ref 136–145)
Total Protein: 7.9 g/dL (ref 6.4–8.3)

## 2013-12-03 LAB — LACTATE DEHYDROGENASE (CC13): LDH: 187 U/L (ref 125–245)

## 2013-12-03 NOTE — Progress Notes (Signed)
[facility] OFFICE PROGRESS NOTE  Patient Care Team: [name], MD as PCP - General (Family Medicine) [name], MD as PCP - Infectious Diseases (Infectious Diseases) [name], MD (Otolaryngology) [name], MD as Referring Physician (Family Medicine)  DIAGNOSIS: Hodgkin lymphoma, no evidence of recurrence  SUMMARY OF ONCOLOGIC HISTORY: Thispatient was diagnosed with classical Hodgkin lymphoma, stage IIA last year after presenting initially with palpable lymphadenopathy in his neck. The patient was treated with several cycles of ABVD from April 2013 to August 2013. Subsequently he underwent consolidation radiation treatment. His PET/CT scan from November 2014 showed no evidence of disease recurrence.  INTERVAL HISTORY: Jon Shannon 48 y.o. male returns for further followup. Recently, he developed upper respiratory tract infection with fever and productive cough. It is resolving. He denies any recent lymphadenopathy, night sweats or abnormal weight loss  I have reviewed the past medical history, past surgical history, social history and family history with the patient and they are unchanged from previous note.  ALLERGIES:  is allergic to shrimp.  MEDICATIONS:  Current Outpatient Prescriptions  Medication Sig Dispense Refill  . ACCU-CHEK AVIVA PLUS test strip       . aspirin 81 MG tablet Take 81 mg by mouth daily.      . B Complex-C (B-COMPLEX WITH VITAMIN C) tablet Take 1 tablet by mouth daily.      . bisoprolol-hydrochlorothiazide (ZIAC) 5-6.25 MG per tablet Take 1 tablet by mouth every morning.       . Canagliflozin 100 MG TABS Take 1 tablet (100 mg total) by mouth every morning.  30 tablet  6  . Cholecalciferol (VITAMIN D3) 1000 UNITS tablet Take 1,000 Units by mouth daily.        Marland Kitchen DHEA 10 MG CAPS Take by mouth.      . diphenhydrAMINE (BENADRYL) 25 MG tablet Take 25 mg by mouth at bedtime as needed. For sleep      . emtricitabine-tenofovir  (TRUVADA) 200-300 MG per tablet Take 1 tablet by mouth daily.  30 tablet  6  . escitalopram (LEXAPRO) 10 MG tablet Take 10 mg by mouth daily.        . famotidine (PEPCID AC) 10 MG chewable tablet Chew 10 mg by mouth 2 (two) times daily.      . fish oil-omega-3 fatty acids 1000 MG capsule Take 1 g by mouth daily.      Marland Kitchen GARLIC OIL PO Take 1 tablet by mouth daily.      Marland Kitchen ibuprofen (ADVIL,MOTRIN) 800 MG tablet Take by mouth every 8 (eight) hours as needed for pain.      Marland Kitchen loratadine (CLARITIN) 10 MG tablet Take 10 mg by mouth daily.        . metFORMIN (GLUCOPHAGE) 500 MG tablet Take 500 mg by mouth 2 (two) times daily with a meal.      . QNASL 80 MCG/ACT AERS       . raltegravir (ISENTRESS) 400 MG tablet Take 1 tablet (400 mg total) by mouth 2 (two) times daily.  60 tablet  6  . vitamin C (ASCORBIC ACID) 500 MG tablet Take 1,000 mg by mouth daily.      . propranolol (INNOPRAN XL) 120 MG 24 hr capsule Take 120 mg by mouth at bedtime.       No current facility-administered medications for this visit.    REVIEW OF SYSTEMS:   Constitutional: Denies fevers, chills or abnormal weight loss Eyes: Denies blurriness of vision  Ears, nose, mouth, throat, and face: Denies mucositis or sore throat Cardiovascular: Denies palpitation, chest discomfort or lower extremity swelling Gastrointestinal:  Denies nausea, heartburn or change in bowel habits Skin: Denies abnormal skin rashes Lymphatics: Denies new lymphadenopathy or easy bruising Neurological:Denies numbness, tingling or new weaknesses Behavioral/Psych: Mood is stable, no new changes  All other systems were reviewed with the patient and are negative.  PHYSICAL EXAMINATION: ECOG PERFORMANCE STATUS: 1 - Symptomatic but completely ambulatory  Filed Vitals:   12/03/13 0856  BP: 124/79  Pulse: 71  Temp: 98.6 F (37 C)  Resp: 20   Filed Weights   12/03/13 0856  Weight: 267 lb 12.8 oz (121.473 kg)    GENERAL:alert, no distress and  comfortable. He is morbidly obese SKIN: skin color, texture, turgor are normal, no rashes or significant lesions EYES: normal, Conjunctiva are pink and non-injected, sclera clear OROPHARYNX:no exudate, no erythema and lips, buccal mucosa, and tongue normal  NECK: supple, thyroid normal size, non-tender, without nodularity LYMPH:  no palpable lymphadenopathy in the cervical, axillary or inguinal LUNGS: clear to auscultation and percussion with normal breathing effort HEART: regular rate & rhythm and no murmurs and no lower extremity edema ABDOMEN:abdomen soft, non-tender and normal bowel sounds Musculoskeletal:no cyanosis of digits and no clubbing  NEURO: alert & oriented x 3 with fluent speech, no focal motor/sensory deficits  LABORATORY DATA:  I have reviewed the data as listed    Component Value Date/Time   NA 141 12/03/2013 0822   NA 139 06/18/2013 1500   K 3.8 12/03/2013 0822   K 3.9 06/18/2013 1500   CL 101 06/18/2013 1500   CL 100 09/16/2012 1514   CO2 24 12/03/2013 0822   CO2 23 06/18/2013 1500   GLUCOSE 147* 12/03/2013 0822   GLUCOSE 103* 06/18/2013 1500   GLUCOSE 116* 09/16/2012 1514   BUN 20.1 12/03/2013 0822   BUN 19 06/18/2013 1500   CREATININE 1.1 12/03/2013 0822   CREATININE 1.05 06/18/2013 1500   CREATININE 0.90 03/18/2012 0937   CALCIUM 10.3 12/03/2013 0822   CALCIUM 9.7 06/18/2013 1500   PROT 7.9 12/03/2013 0822   PROT 7.3 06/18/2013 1500   ALBUMIN 4.2 12/03/2013 0822   ALBUMIN 4.5 06/18/2013 1500   AST 22 12/03/2013 0822   AST 41* 06/18/2013 1500   ALT 29 12/03/2013 0822   ALT 54* 06/18/2013 1500   ALKPHOS 61 12/03/2013 0822   ALKPHOS 49 06/18/2013 1500   BILITOT 0.39 12/03/2013 0822   BILITOT 0.4 06/18/2013 1500   GFRNONAA >89 05/27/2012 1528   GFRAA >89 05/27/2012 1528    No results found for this basename: SPEP,  UPEP,   kappa and lambda light chains    Lab Results  Component Value Date   WBC 9.9 12/03/2013   NEUTROABS 6.6* 12/03/2013   HGB 15.4 12/03/2013   HCT 46.2  12/03/2013   MCV 89.4 12/03/2013   PLT 249 12/03/2013      Chemistry      Component Value Date/Time   NA 141 12/03/2013 0822   NA 139 06/18/2013 1500   K 3.8 12/03/2013 0822   K 3.9 06/18/2013 1500   CL 101 06/18/2013 1500   CL 100 09/16/2012 1514   CO2 24 12/03/2013 0822   CO2 23 06/18/2013 1500   BUN 20.1 12/03/2013 0822   BUN 19 06/18/2013 1500   CREATININE 1.1 12/03/2013 0822   CREATININE 1.05 06/18/2013 1500   CREATININE 0.90 03/18/2012 0937   GLU 286* 11/24/2011 1446  Component Value Date/Time   CALCIUM 10.3 12/03/2013 0822   CALCIUM 9.7 06/18/2013 1500   ALKPHOS 61 12/03/2013 0822   ALKPHOS 49 06/18/2013 1500   AST 22 12/03/2013 0822   AST 41* 06/18/2013 1500   ALT 29 12/03/2013 0822   ALT 54* 06/18/2013 1500   BILITOT 0.39 12/03/2013 0822   BILITOT 0.4 06/18/2013 1500     ASSESSMENT & PLAN:  #1 history of Hodgkin lymphoma He has no evidence of recurrence. I will only follow him clinically from now on. I would defer imaging study unless he had signs and symptoms to suggest disease recurrence. I will see him in 6 months with history, physical examination and blood work. I attempted to contact his infectious disease physician to space out our visits so that he will see one of [place] every 3 months with blood work and clinical visit. #2 HIV infection His total white blood cell count is normal. He will continue his treatment under the care of his infectious disease consultant.  Orders Placed This Encounter  Procedures  . CBC with Differential    Standing Status: Future     Number of Occurrences:      Standing Expiration Date: 12/03/2014  . Comprehensive metabolic panel    Standing Status: Future     Number of Occurrences:      Standing Expiration Date: 12/03/2014  . Lactate dehydrogenase    Standing Status: Future     Number of Occurrences:      Standing Expiration Date: 12/03/2014   All questions were answered. The patient knows to call the clinic with any problems, questions or  concerns. No barriers to learning was detected. I spent 15 minutes counseling the patient face to face. The total time spent in the appointment was 20 minutes and more than 50% was on counseling and review of test results     Heath Lark, MD 12/03/2013 4:26 PM

## 2013-12-05 ENCOUNTER — Telehealth: Payer: Self-pay | Admitting: Hematology and Oncology

## 2013-12-05 NOTE — Telephone Encounter (Signed)
lvm for pt regarding to NOv appt...mailed pt appt sched/avs and letter

## 2013-12-14 ENCOUNTER — Other Ambulatory Visit: Payer: Self-pay | Admitting: Infectious Diseases

## 2013-12-14 DIAGNOSIS — B20 Human immunodeficiency virus [HIV] disease: Secondary | ICD-10-CM

## 2013-12-29 ENCOUNTER — Other Ambulatory Visit: Payer: 59

## 2013-12-29 DIAGNOSIS — B20 Human immunodeficiency virus [HIV] disease: Secondary | ICD-10-CM

## 2013-12-29 DIAGNOSIS — Z113 Encounter for screening for infections with a predominantly sexual mode of transmission: Secondary | ICD-10-CM

## 2013-12-29 DIAGNOSIS — Z79899 Other long term (current) drug therapy: Secondary | ICD-10-CM

## 2013-12-29 LAB — CBC WITH DIFFERENTIAL/PLATELET
Basophils Absolute: 0 10*3/uL (ref 0.0–0.1)
Basophils Relative: 0 % (ref 0–1)
Eosinophils Absolute: 0.1 10*3/uL (ref 0.0–0.7)
Eosinophils Relative: 1 % (ref 0–5)
HCT: 42.6 % (ref 39.0–52.0)
HEMOGLOBIN: 15.1 g/dL (ref 13.0–17.0)
LYMPHS ABS: 3.1 10*3/uL (ref 0.7–4.0)
LYMPHS PCT: 52 % — AB (ref 12–46)
MCH: 30.7 pg (ref 26.0–34.0)
MCHC: 35.4 g/dL (ref 30.0–36.0)
MCV: 86.6 fL (ref 78.0–100.0)
MONOS PCT: 7 % (ref 3–12)
Monocytes Absolute: 0.4 10*3/uL (ref 0.1–1.0)
NEUTROS PCT: 40 % — AB (ref 43–77)
Neutro Abs: 2.4 10*3/uL (ref 1.7–7.7)
PLATELETS: 272 10*3/uL (ref 150–400)
RBC: 4.92 MIL/uL (ref 4.22–5.81)
RDW: 14.7 % (ref 11.5–15.5)
WBC: 5.9 10*3/uL (ref 4.0–10.5)

## 2013-12-29 LAB — COMPLETE METABOLIC PANEL WITH GFR
ALT: 25 U/L (ref 0–53)
AST: 21 U/L (ref 0–37)
Albumin: 4.8 g/dL (ref 3.5–5.2)
Alkaline Phosphatase: 57 U/L (ref 39–117)
BUN: 24 mg/dL — ABNORMAL HIGH (ref 6–23)
CALCIUM: 10.2 mg/dL (ref 8.4–10.5)
CHLORIDE: 100 meq/L (ref 96–112)
CO2: 25 meq/L (ref 19–32)
Creat: 1.02 mg/dL (ref 0.50–1.35)
GFR, Est Non African American: 87 mL/min
Glucose, Bld: 109 mg/dL — ABNORMAL HIGH (ref 70–99)
Potassium: 4.3 mEq/L (ref 3.5–5.3)
SODIUM: 136 meq/L (ref 135–145)
TOTAL PROTEIN: 7.5 g/dL (ref 6.0–8.3)
Total Bilirubin: 0.3 mg/dL (ref 0.2–1.2)

## 2013-12-29 LAB — LIPID PANEL
CHOL/HDL RATIO: 5.9 ratio
CHOLESTEROL: 160 mg/dL (ref 0–200)
HDL: 27 mg/dL — ABNORMAL LOW (ref >39)
Triglycerides: 694 mg/dL — ABNORMAL HIGH (ref <150)

## 2013-12-30 LAB — HIV-1 RNA QUANT-NO REFLEX-BLD
HIV 1 RNA Quant: <20 copies/mL (ref <20)
HIV-1 RNA Quant, Log: <1.3 {Log} (ref <1.30)

## 2013-12-30 LAB — RPR

## 2013-12-30 LAB — T-HELPER CELL (CD4) - (RCID CLINIC ONLY)
CD4 % Helper T Cell: 27 % — ABNORMAL LOW (ref 33–55)
CD4 T Cell Abs: 850 /uL (ref 400–2700)

## 2014-01-12 ENCOUNTER — Ambulatory Visit (INDEPENDENT_AMBULATORY_CARE_PROVIDER_SITE_OTHER): Payer: 59 | Admitting: Infectious Diseases

## 2014-01-12 ENCOUNTER — Encounter: Payer: Self-pay | Admitting: Infectious Diseases

## 2014-01-12 VITALS — BP 145/84 | HR 71 | Temp 98.3°F | Wt 272.0 lb

## 2014-01-12 DIAGNOSIS — B20 Human immunodeficiency virus [HIV] disease: Secondary | ICD-10-CM

## 2014-01-12 DIAGNOSIS — E119 Type 2 diabetes mellitus without complications: Secondary | ICD-10-CM

## 2014-01-12 DIAGNOSIS — C8190 Hodgkin lymphoma, unspecified, unspecified site: Secondary | ICD-10-CM

## 2014-01-12 DIAGNOSIS — E781 Pure hyperglyceridemia: Secondary | ICD-10-CM

## 2014-01-12 MED ORDER — RALTEGRAVIR POTASSIUM 400 MG PO TABS: 400.0000 mg | ORAL_TABLET | Freq: Two times a day (BID) | ORAL | Status: DC

## 2014-01-12 MED ORDER — EMTRICITABINE-TENOFOVIR DF 200-300 MG PO TABS: 1.0000 | ORAL_TABLET | Freq: Every day | ORAL | Status: DC

## 2014-01-12 NOTE — Assessment & Plan Note (Signed)
Has restarted his rx for this. Will see him back in 6 months, will recheck his lipids then.

## 2014-01-12 NOTE — Progress Notes (Signed)
   Subjective:    Patient ID: Jon Shannon, male    DOB: 04-Aug-1965, 48 y.o.   MRN: [id number]  HPI 48 yo M with HIV+, stage IIa Hodgkin's lymphoma. He was recently seen by onc for ? Of recurrence, wt loss (~26#). He had a PET that was (-) and CT scan that also did not show recurrence.  Has DM as well from steroids. Today has been feeling down, tired of being alone.  At last Onc f/u 5-6 he was told his "test results cam back as close to perfect as possible".  Has had some worsening of his allergic sx, taking metformin as well now. going to gym now (5-6/week).  Had quit taking his lopid due to cost recently. Has restarted.   HIV 1 RNA Quant (copies/mL)  Date Value  12/29/2013 <20   06/18/2013 <20   01/30/2013 <20      CD4 T Cell Abs (/uL)  Date Value  12/29/2013 850   06/18/2013 750   01/30/2013 680    Lab Results  Component Value Date   CHOL 160 12/29/2013   HDL 27* 12/29/2013   LDLCALC NOT CALC 12/29/2013   TRIG 694* 12/29/2013   CHOLHDL 5.9 12/29/2013    No ophtho in last year.   Review of Systems  Constitutional: Negative for appetite change and unexpected weight change.  Eyes: Negative for visual disturbance.  Gastrointestinal: Negative for diarrhea and constipation.  Genitourinary: Negative for difficulty urinating.  Neurological: Positive for numbness.       Objective:   Physical Exam  Constitutional: He appears well-developed and well-nourished.  HENT:  Mouth/Throat: No oropharyngeal exudate.  Eyes: EOM are normal. Pupils are equal, round, and reactive to light.  Neck: Normal range of motion. Neck supple.  Cardiovascular: Normal rate, regular rhythm and normal heart sounds.   Pulmonary/Chest: Effort normal and breath sounds normal.  Abdominal: Soft. Bowel sounds are normal. He exhibits no distension. There is no tenderness.  Musculoskeletal: He exhibits no edema.  No diabetic foot lesions.   Lymphadenopathy:    He has no cervical adenopathy.  Neurological:  Decreased  light touch in LE.           Assessment & Plan:

## 2014-01-12 NOTE — Assessment & Plan Note (Signed)
Has evidence of neuropathy. Appreciate partnering with PCP.

## 2014-01-12 NOTE — Assessment & Plan Note (Signed)
Will continue to f/u with onc. Greatly appreciate partnering with them.

## 2014-01-12 NOTE — Assessment & Plan Note (Addendum)
He is doing very well. Offered/refused condoms. vax up to date. Will check his HLA at f/u to see if he can change to once daily. [place] [facility]. Will see him back in 6 months.

## 2014-03-24 ENCOUNTER — Encounter: Payer: Self-pay | Admitting: Infectious Diseases

## 2014-03-24 NOTE — Telephone Encounter (Signed)
Patient sent email via mychart with a question about h. Pylori.  Please advise. [name], RN ________________ I was reading information on [facility] and have a question to ask with my history for Hodgkins Lymphoma and my use of my HIV meds. Do I need to have an urea breath test to check for H. Pylori and if so when would I be able to have the test run? I don't want to sound like a hypochondriac and it may just be me associating the bloated belly and occasional stomache pains, along with back pains in lower back on each side of my spine. Please let me know what you think.  Jon Shannon

## 2014-04-27 ENCOUNTER — Encounter: Payer: Self-pay | Admitting: Infectious Diseases

## 2014-04-30 ENCOUNTER — Encounter: Payer: Self-pay | Admitting: Infectious Diseases

## 2014-05-27 ENCOUNTER — Telehealth: Payer: Self-pay | Admitting: Hematology and Oncology

## 2014-05-27 NOTE — Telephone Encounter (Signed)
pt called to r/s 11/12 appt to 11/16 - phas new d/t.

## 2014-05-27 NOTE — Telephone Encounter (Signed)
returned pt call and lvm advising to call back with another d.t that he can come

## 2014-06-11 ENCOUNTER — Encounter: Payer: Self-pay | Admitting: Infectious Diseases

## 2014-06-11 ENCOUNTER — Ambulatory Visit: Payer: 59 | Admitting: Hematology and Oncology

## 2014-06-11 ENCOUNTER — Other Ambulatory Visit: Payer: 59

## 2014-06-15 ENCOUNTER — Ambulatory Visit (HOSPITAL_BASED_OUTPATIENT_CLINIC_OR_DEPARTMENT_OTHER): Payer: 59 | Admitting: Hematology and Oncology

## 2014-06-15 ENCOUNTER — Encounter: Payer: Self-pay | Admitting: Hematology and Oncology

## 2014-06-15 ENCOUNTER — Ambulatory Visit: Payer: 59

## 2014-06-15 ENCOUNTER — Telehealth: Payer: Self-pay | Admitting: *Deleted

## 2014-06-15 ENCOUNTER — Other Ambulatory Visit (HOSPITAL_BASED_OUTPATIENT_CLINIC_OR_DEPARTMENT_OTHER): Payer: 59

## 2014-06-15 ENCOUNTER — Telehealth: Payer: Self-pay | Admitting: Hematology and Oncology

## 2014-06-15 VITALS — BP 129/67 | HR 77 | Temp 98.1°F | Resp 18 | Ht 72.0 in | Wt 279.3 lb

## 2014-06-15 DIAGNOSIS — B20 Human immunodeficiency virus [HIV] disease: Secondary | ICD-10-CM

## 2014-06-15 DIAGNOSIS — Z23 Encounter for immunization: Secondary | ICD-10-CM

## 2014-06-15 DIAGNOSIS — R748 Abnormal levels of other serum enzymes: Secondary | ICD-10-CM

## 2014-06-15 DIAGNOSIS — E66812 Obesity, class 2: Secondary | ICD-10-CM | POA: Insufficient documentation

## 2014-06-15 DIAGNOSIS — R7989 Other specified abnormal findings of blood chemistry: Secondary | ICD-10-CM

## 2014-06-15 DIAGNOSIS — L989 Disorder of the skin and subcutaneous tissue, unspecified: Secondary | ICD-10-CM

## 2014-06-15 DIAGNOSIS — C8190 Hodgkin lymphoma, unspecified, unspecified site: Secondary | ICD-10-CM

## 2014-06-15 DIAGNOSIS — Z8571 Personal history of Hodgkin lymphoma: Secondary | ICD-10-CM

## 2014-06-15 DIAGNOSIS — E669 Obesity, unspecified: Secondary | ICD-10-CM | POA: Insufficient documentation

## 2014-06-15 HISTORY — DX: Other specified abnormal findings of blood chemistry: R79.89

## 2014-06-15 LAB — COMPREHENSIVE METABOLIC PANEL (CC13)
ALK PHOS: 62 U/L (ref 40–150)
ALT: 40 U/L (ref 0–55)
AST: 27 U/L (ref 5–34)
Albumin: 4.5 g/dL (ref 3.5–5.0)
Anion Gap: 11 mEq/L (ref 3–11)
BILIRUBIN TOTAL: 0.34 mg/dL (ref 0.20–1.20)
BUN: 19.4 mg/dL (ref 7.0–26.0)
CO2: 29 meq/L (ref 22–29)
Calcium: 10.6 mg/dL — ABNORMAL HIGH (ref 8.4–10.4)
Chloride: 101 mEq/L (ref 98–109)
Creatinine: 1.5 mg/dL — ABNORMAL HIGH (ref 0.7–1.3)
Glucose: 138 mg/dl (ref 70–140)
Potassium: 4 mEq/L (ref 3.5–5.1)
SODIUM: 141 meq/L (ref 136–145)
TOTAL PROTEIN: 8.1 g/dL (ref 6.4–8.3)

## 2014-06-15 LAB — CBC WITH DIFFERENTIAL/PLATELET
BASO%: 0.2 % (ref 0.0–2.0)
Basophils Absolute: 0 10*3/uL (ref 0.0–0.1)
EOS%: 0.8 % (ref 0.0–7.0)
Eosinophils Absolute: 0.1 10*3/uL (ref 0.0–0.5)
HCT: 46.8 % (ref 38.4–49.9)
HGB: 15.6 g/dL (ref 13.0–17.1)
LYMPH%: 48.6 % (ref 14.0–49.0)
MCH: 30.6 pg (ref 27.2–33.4)
MCHC: 33.3 g/dL (ref 32.0–36.0)
MCV: 91.8 fL (ref 79.3–98.0)
MONO#: 0.4 10*3/uL (ref 0.1–0.9)
MONO%: 6.6 % (ref 0.0–14.0)
NEUT#: 2.6 10*3/uL (ref 1.5–6.5)
NEUT%: 43.8 % (ref 39.0–75.0)
Platelets: 253 10*3/uL (ref 140–400)
RBC: 5.1 10*6/uL (ref 4.20–5.82)
RDW: 12.8 % (ref 11.0–14.6)
WBC: 5.9 10*3/uL (ref 4.0–10.3)
lymph#: 2.9 10*3/uL (ref 0.9–3.3)

## 2014-06-15 LAB — LACTATE DEHYDROGENASE (CC13): LDH: 163 U/L (ref 125–245)

## 2014-06-15 MED ORDER — INFLUENZA VAC SPLIT QUAD 0.5 ML IM SUSY
0.5000 mL | PREFILLED_SYRINGE | Freq: Once | INTRAMUSCULAR | Status: AC
  Administered 2014-06-15: 0.5 mL via INTRAMUSCULAR
  Filled 2014-06-15: qty 0.5

## 2014-06-15 NOTE — Progress Notes (Signed)
[facility] OFFICE PROGRESS NOTE  Patient Care Team: [name], MD as PCP - General (Family Medicine) [name], MD as PCP - Infectious Diseases (Infectious Diseases) [name], MD (Otolaryngology) [name], MD as Referring Physician (Family Medicine)  SUMMARY OF ONCOLOGIC HISTORY:  Thispatient was diagnosed with classical Hodgkin lymphoma, stage IIA last year after presenting initially with palpable lymphadenopathy in his neck. The patient was treated with several cycles of ABVD from April 2013 to August 2013. Subsequently he underwent consolidation radiation treatment. His PET/CT scan from November 2014 showed no evidence of disease recurrence.  INTERVAL HISTORY: Please see below for problem oriented charting. He had recent acute gout attack. The patient is morbidly obese. He also complained of new lesions in both years. Denies recent infection. No new lymphadenopathy.  REVIEW OF SYSTEMS:   Constitutional: Denies fevers, chills or abnormal weight loss Eyes: Denies blurriness of vision Ears, nose, mouth, throat, and face: Denies mucositis or sore throat Respiratory: Denies cough, dyspnea or wheezes Cardiovascular: Denies palpitation, chest discomfort or lower extremity swelling Gastrointestinal:  Denies nausea, heartburn or change in bowel habits Lymphatics: Denies new lymphadenopathy or easy bruising Neurological:Denies numbness, tingling or new weaknesses Behavioral/Psych: Mood is stable, no new changes  All other systems were reviewed with the patient and are negative.  I have reviewed the past medical history, past surgical history, social history and family history with the patient and they are unchanged from previous note.  ALLERGIES:  is allergic to shrimp.  MEDICATIONS:  Current Outpatient Prescriptions  Medication Sig Dispense Refill  . ACCU-CHEK AVIVA PLUS test strip     . aspirin 81 MG tablet Take 81 mg by mouth daily.    . B  Complex-C (B-COMPLEX WITH VITAMIN C) tablet Take 1 tablet by mouth daily.    . bisoprolol-hydrochlorothiazide (ZIAC) 5-6.25 MG per tablet Take 1 tablet by mouth every morning.     . Canagliflozin 100 MG TABS Take 1 tablet (100 mg total) by mouth every morning. 30 tablet 6  . Cholecalciferol (VITAMIN D3) 1000 UNITS tablet Take 1,000 Units by mouth daily.      Marland Kitchen DHEA 10 MG CAPS Take by mouth.    . diphenhydrAMINE (BENADRYL) 25 MG tablet Take 25 mg by mouth at bedtime as needed. For sleep    . emtricitabine-tenofovir (TRUVADA) 200-300 MG per tablet Take 1 tablet by mouth daily. 90 tablet 6  . escitalopram (LEXAPRO) 10 MG tablet Take 10 mg by mouth daily.      . famotidine (PEPCID AC) 10 MG chewable tablet Chew 10 mg by mouth 2 (two) times daily.    . fish oil-omega-3 fatty acids 1000 MG capsule Take 1 g by mouth daily.    Marland Kitchen GARLIC OIL PO Take 1 tablet by mouth daily.    Marland Kitchen ibuprofen (ADVIL,MOTRIN) 800 MG tablet Take by mouth every 8 (eight) hours as needed for pain.    Marland Kitchen loratadine (CLARITIN) 10 MG tablet Take 10 mg by mouth daily.      . metFORMIN (GLUCOPHAGE) 500 MG tablet Take 500 mg by mouth 2 (two) times daily with a meal.    . raltegravir (ISENTRESS) 400 MG tablet Take 1 tablet (400 mg total) by mouth 2 (two) times daily. 120 tablet 6  . vitamin C (ASCORBIC ACID) 500 MG tablet Take 1,000 mg by mouth daily.    . propranolol (INNOPRAN XL) 120 MG 24 hr capsule Take 120 mg by mouth at bedtime.  No current facility-administered medications for this visit.    PHYSICAL EXAMINATION: ECOG PERFORMANCE STATUS: 1 - Symptomatic but completely ambulatory  Filed Vitals:   06/15/14 1329  BP: 129/67  Pulse: 77  Temp: 98.1 F (36.7 C)  Resp: 18   Filed Weights   06/15/14 1329  Weight: 279 lb 4.8 oz (126.69 kg)    GENERAL:alert, no distress and comfortable. He is morbidly obese SKIN: skin color, texture, turgor are normal, no rashes or significant lesions. No other lesions on both ears  suspicious for calcium deposits EYES: normal, Conjunctiva are pink and non-injected, sclera clear OROPHARYNX:no exudate, no erythema and lips, buccal mucosa, and tongue normal  NECK: supple, thyroid normal size, non-tender, without nodularity LYMPH:  no palpable lymphadenopathy in the cervical, axillary or inguinal LUNGS: clear to auscultation and percussion with normal breathing effort HEART: regular rate & rhythm and no murmurs and no lower extremity edema ABDOMEN:abdomen soft, non-tender and normal bowel sounds Musculoskeletal:no cyanosis of digits and no clubbing  NEURO: alert & oriented x 3 with fluent speech, no focal motor/sensory deficits  LABORATORY DATA:  I have reviewed the data as listed    Component Value Date/Time   NA 141 06/15/2014 1312   NA 136 12/29/2013 0906   K 4.0 06/15/2014 1312   K 4.3 12/29/2013 0906   CL 100 12/29/2013 0906   CL 100 09/16/2012 1514   CO2 29 06/15/2014 1312   CO2 25 12/29/2013 0906   GLUCOSE 138 06/15/2014 1312   GLUCOSE 109* 12/29/2013 0906   GLUCOSE 116* 09/16/2012 1514   BUN 19.4 06/15/2014 1312   BUN 24* 12/29/2013 0906   CREATININE 1.5* 06/15/2014 1312   CREATININE 1.02 12/29/2013 0906   CREATININE 0.90 03/18/2012 0937   CALCIUM 10.6* 06/15/2014 1312   CALCIUM 10.2 12/29/2013 0906   PROT 8.1 06/15/2014 1312   PROT 7.5 12/29/2013 0906   ALBUMIN 4.5 06/15/2014 1312   ALBUMIN 4.8 12/29/2013 0906   AST 27 06/15/2014 1312   AST 21 12/29/2013 0906   ALT 40 06/15/2014 1312   ALT 25 12/29/2013 0906   ALKPHOS 62 06/15/2014 1312   ALKPHOS 57 12/29/2013 0906   BILITOT 0.34 06/15/2014 1312   BILITOT 0.3 12/29/2013 0906   GFRNONAA 87 12/29/2013 0906   GFRAA >89 12/29/2013 0906    No results found for: SPEP, UPEP  Lab Results  Component Value Date   WBC 5.9 06/15/2014   NEUTROABS 2.6 06/15/2014   HGB 15.6 06/15/2014   HCT 46.8 06/15/2014   MCV 91.8 06/15/2014   PLT 253 06/15/2014      Chemistry      Component Value  Date/Time   NA 141 06/15/2014 1312   NA 136 12/29/2013 0906   K 4.0 06/15/2014 1312   K 4.3 12/29/2013 0906   CL 100 12/29/2013 0906   CL 100 09/16/2012 1514   CO2 29 06/15/2014 1312   CO2 25 12/29/2013 0906   BUN 19.4 06/15/2014 1312   BUN 24* 12/29/2013 0906   CREATININE 1.5* 06/15/2014 1312   CREATININE 1.02 12/29/2013 0906   CREATININE 0.90 03/18/2012 0937   GLU 286* 11/24/2011 1446      Component Value Date/Time   CALCIUM 10.6* 06/15/2014 1312   CALCIUM 10.2 12/29/2013 0906   ALKPHOS 62 06/15/2014 1312   ALKPHOS 57 12/29/2013 0906   AST 27 06/15/2014 1312   AST 21 12/29/2013 0906   ALT 40 06/15/2014 1312   ALT 25 12/29/2013 0906   BILITOT 0.34 06/15/2014  1312   BILITOT 0.3 12/29/2013 0906      ASSESSMENT & PLAN:  Stage IIA Classical Hodgkin's Lymphoma, Mixed Cellularity Clinically, he has no signs of recurrence. I see him back in 6 months with repeat history, physical examination and blood work. Once he is closer to 3 years out from his prior treatment, I will see him on a yearly basis.  Elevated serum creatinine The cause is unknown could be due to dehydration. I recommend increase fluid hydration and recheck in 1 month.  Human immunodeficiency virus (HIV) disease He is receiving anti-retroviral treatment. CBC is within normal limits. He will continue retroviral treatment under the guidance of his infectious disease physician.  Morbid obesity He is morbidly obese. I recommend dietary modification and weight loss. The patient will attempt to lose 10 pounds of weight by his next visit.  Skin lesions He has new skin lesions on his ears which I suspect is due to calcium deposit. I recommend dermatology evaluation but the patient declined.  We discussed the importance of preventive care and reviewed the vaccination programs. He does not have any prior allergic reactions to influenza vaccination. He agrees to proceed with influenza vaccination today and we will  administer it today at the clinic.  Orders Placed This Encounter  Procedures  . CBC with Differential    Standing Status: Future     Number of Occurrences:      Standing Expiration Date: 07/20/2015  . Comprehensive metabolic panel    Standing Status: Future     Number of Occurrences:      Standing Expiration Date: 07/20/2015  . Lactate dehydrogenase    Standing Status: Future     Number of Occurrences:      Standing Expiration Date: 07/20/2015  . Comprehensive metabolic panel    Standing Status: Future     Number of Occurrences:      Standing Expiration Date: 07/20/2015   All questions were answered. The patient knows to call the clinic with any problems, questions or concerns. No barriers to learning was detected. I spent 25 minutes counseling the patient face to face. The total time spent in the appointment was 30 minutes and more than 50% was on counseling and review of test results     [facility], [place], MD 06/15/2014 2:21 PM

## 2014-06-15 NOTE — Assessment & Plan Note (Signed)
He is morbidly obese. I recommend dietary modification and weight loss. The patient will attempt to lose 10 pounds of weight by his next visit.

## 2014-06-15 NOTE — Telephone Encounter (Signed)
-----   Message from Heath Lark, MD sent at 06/15/2014  2:08 PM EST ----- Regarding: labs He has elevated Cr and Calcium likely due to dehydration. Recommend increase oral fluids and recheck in 1 month. I will place a POF for that ----- Message -----    From: [facility]    Sent: 06/15/2014   1:26 PM      To: Heath Lark, MD

## 2014-06-15 NOTE — Assessment & Plan Note (Signed)
Clinically, he has no signs of recurrence. I see him back in 6 months with repeat history, physical examination and blood work. Once he is closer to 3 years out from his prior treatment, I will see him on a yearly basis.

## 2014-06-15 NOTE — Assessment & Plan Note (Signed)
He has new skin lesions on his ears which I suspect is due to calcium deposit. I recommend dermatology evaluation but the patient declined.

## 2014-06-15 NOTE — Telephone Encounter (Signed)
Left message with note below 

## 2014-06-15 NOTE — Assessment & Plan Note (Signed)
He is receiving anti-retroviral treatment. CBC is within normal limits. He will continue retroviral treatment under the guidance of his infectious disease physician. 

## 2014-06-15 NOTE — Assessment & Plan Note (Signed)
The cause is unknown could be due to dehydration. I recommend increase fluid hydration and recheck in 1 month.

## 2014-06-15 NOTE — Telephone Encounter (Signed)
Pt confirmed labs/ov per 11/16 POF, gave pt AVS.... KJ °

## 2014-07-01 ENCOUNTER — Other Ambulatory Visit: Payer: 59

## 2014-07-01 DIAGNOSIS — B20 Human immunodeficiency virus [HIV] disease: Secondary | ICD-10-CM

## 2014-07-01 DIAGNOSIS — E781 Pure hyperglyceridemia: Secondary | ICD-10-CM

## 2014-07-01 LAB — CBC WITH DIFFERENTIAL/PLATELET
Basophils Absolute: 0 10*3/uL (ref 0.0–0.1)
Basophils Relative: 0 % (ref 0–1)
EOS ABS: 0.1 10*3/uL (ref 0.0–0.7)
Eosinophils Relative: 1 % (ref 0–5)
HEMATOCRIT: 43.7 % (ref 39.0–52.0)
HEMOGLOBIN: 15.4 g/dL (ref 13.0–17.0)
Lymphocytes Relative: 54 % — ABNORMAL HIGH (ref 12–46)
Lymphs Abs: 2.8 10*3/uL (ref 0.7–4.0)
MCH: 30.7 pg (ref 26.0–34.0)
MCHC: 35.2 g/dL (ref 30.0–36.0)
MCV: 87.1 fL (ref 78.0–100.0)
MPV: 9.8 fL (ref 9.4–12.4)
Monocytes Absolute: 0.4 10*3/uL (ref 0.1–1.0)
Monocytes Relative: 8 % (ref 3–12)
NEUTROS PCT: 37 % — AB (ref 43–77)
Neutro Abs: 1.9 10*3/uL (ref 1.7–7.7)
Platelets: 263 10*3/uL (ref 150–400)
RBC: 5.02 MIL/uL (ref 4.22–5.81)
RDW: 13.5 % (ref 11.5–15.5)
WBC: 5.1 10*3/uL (ref 4.0–10.5)

## 2014-07-01 LAB — LIPID PANEL
Cholesterol: 176 mg/dL (ref 0–200)
HDL: 23 mg/dL — ABNORMAL LOW (ref >39)
Total CHOL/HDL Ratio: 7.7 Ratio
Triglycerides: 1073 mg/dL — ABNORMAL HIGH (ref <150)

## 2014-07-01 LAB — COMPLETE METABOLIC PANEL WITH GFR
ALBUMIN: 4.8 g/dL (ref 3.5–5.2)
ALK PHOS: 61 U/L (ref 39–117)
ALT: 45 U/L (ref 0–53)
AST: 33 U/L (ref 0–37)
BUN: 17 mg/dL (ref 6–23)
CALCIUM: 10.2 mg/dL (ref 8.4–10.5)
CHLORIDE: 102 meq/L (ref 96–112)
CO2: 27 mEq/L (ref 19–32)
Creat: 1.08 mg/dL (ref 0.50–1.35)
GFR, EST NON AFRICAN AMERICAN: 80 mL/min
GLUCOSE: 118 mg/dL — AB (ref 70–99)
Potassium: 3.9 mEq/L (ref 3.5–5.3)
Sodium: 139 mEq/L (ref 135–145)
Total Bilirubin: 0.3 mg/dL (ref 0.2–1.2)
Total Protein: 7.6 g/dL (ref 6.0–8.3)

## 2014-07-02 LAB — HIV-1 RNA QUANT-NO REFLEX-BLD: HIV-1 RNA Quant, Log: <1.3 {Log} (ref <1.30)

## 2014-07-02 LAB — T-HELPER CELLS (CD4) COUNT (NOT AT ARMC)
CD4 % Helper T Cell: 26 % — ABNORMAL LOW (ref 33–55)
CD4 T Cell Abs: 730 /uL (ref 400–2700)

## 2014-07-08 LAB — HLA B*5701: HLA-B 5701 W/RFLX HLA-B HIGH: NEGATIVE

## 2014-07-15 ENCOUNTER — Ambulatory Visit (INDEPENDENT_AMBULATORY_CARE_PROVIDER_SITE_OTHER): Payer: 59 | Admitting: Infectious Diseases

## 2014-07-15 ENCOUNTER — Other Ambulatory Visit: Payer: Self-pay | Admitting: Pharmacist Clinician (PhC)/ Clinical Pharmacy Specialist

## 2014-07-15 ENCOUNTER — Encounter: Payer: Self-pay | Admitting: Infectious Diseases

## 2014-07-15 VITALS — BP 138/86 | HR 71 | Temp 98.5°F | Wt 277.0 lb

## 2014-07-15 DIAGNOSIS — B20 Human immunodeficiency virus [HIV] disease: Secondary | ICD-10-CM

## 2014-07-15 DIAGNOSIS — Z113 Encounter for screening for infections with a predominantly sexual mode of transmission: Secondary | ICD-10-CM

## 2014-07-15 DIAGNOSIS — F341 Dysthymic disorder: Secondary | ICD-10-CM

## 2014-07-15 DIAGNOSIS — E1142 Type 2 diabetes mellitus with diabetic polyneuropathy: Secondary | ICD-10-CM

## 2014-07-15 DIAGNOSIS — E781 Pure hyperglyceridemia: Secondary | ICD-10-CM

## 2014-07-15 DIAGNOSIS — M1000 Idiopathic gout, unspecified site: Secondary | ICD-10-CM

## 2014-07-15 DIAGNOSIS — C8190 Hodgkin lymphoma, unspecified, unspecified site: Secondary | ICD-10-CM

## 2014-07-15 DIAGNOSIS — M109 Gout, unspecified: Secondary | ICD-10-CM | POA: Insufficient documentation

## 2014-07-15 MED ORDER — ELVITEG-COBIC-EMTRICIT-TENOFAF 150-150-200-10 MG PO TABS: 1.0000 | ORAL_TABLET | Freq: Every day | ORAL | Status: DC

## 2014-07-15 NOTE — Assessment & Plan Note (Signed)
He is hep A and B immune. He is doing well. Will change him to genvoya to reduce his pill burden and his renal risk.  Will see him back in 3-4 months to re-asses. He has has gotten flu shot. He needs PNVX next year.

## 2014-07-15 NOTE — Progress Notes (Signed)
Patient ID: Jon Shannon, male   DOB: 1965-08-15, 48 y.o.   MRN: [id number] HPI: Jon Shannon is a 48 y.o. male who is here for his f/u of his HIV  Allergies: Allergies  Allergen Reactions  . Shrimp [Shellfish Allergy] Anaphylaxis    Vitals: Temp: 98.5 F (36.9 C) (12/16 0847) Temp Source: Oral (12/16 0847) BP: 138/86 mmHg (12/16 0847) Pulse Rate: 71 (12/16 0847)  Past Medical History: Past Medical History  Diagnosis Date  . Hyperlipidemia 08/07/2011  . Hodgkin's lymphoma 11/09/2011  . HIV INFECTION 11/30/2009    no history of opportunistic infection  . HYPERTENSION 11/30/2009  . Hypertriglyceridemia   . PTSD (post-traumatic stress disorder)   . DJD (degenerative joint disease)   . Hepatitis A     resolved.   Marland Kitchen Hx of radiation therapy 04/03/12 -04/29/12    Hodgkin's disease -neck  . Weight loss 05/29/2013  . Rectal bleeding 09/04/2013  . Elevated serum creatinine 06/15/2014    Social History: History   Social History  . Marital Status: Single    Spouse Name: N/A    Number of Children: 0  . Years of Education: 74   Occupational History  . 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 Smoking status: Former Smoker -- 1.00 packs/day    Types: Cigarettes    Quit date: 07/01/2011  . Smokeless tobacco: Never Used  . Alcohol Use: No  . Drug Use: No  . Sexual Activity: Not Currently     Comment: pt. declined condoms   Other Topics Concern  . None   Social History Narrative    Previous Regimen:   Current Regimen: RAL + TRV  Labs: HIV 1 RNA QUANT (copies/mL)  Date Value  07/01/2014 <20  12/29/2013 <20  06/18/2013 <20   CD4 T CELL ABS (/uL)  Date Value  07/01/2014 730  12/29/2013 850  06/18/2013 750   HEP B S AB (no units)  Date Value  05/29/2013 POS*   HEPATITIS B SURFACE AG (no units)  Date Value  05/29/2013 NEGATIVE   HCV AB (no units)  Date Value  11/30/2009 NEG    CrCl: Estimated Creatinine Clearance: 113.3 mL/min (by C-G  formula based on Cr of 1.08).  Lipids:    Component Value Date/Time   CHOL 176 07/01/2014 0905   TRIG 1073* 07/01/2014 0905   HDL 23* 07/01/2014 0905   CHOLHDL 7.7 07/01/2014 0905   VLDL NOT CALC 07/01/2014 0905   LDLCALC NOT CALC 07/01/2014 0905    Assessment: 48 yo who is well controlled on his current regimen. His scr issue has seemed to be resolved. His triglycerides has been very elevated. We decided to simplifying him to [facility]. Discuss the new meds with him. He was recently prescribed colchicine which could interact with cobicistat. He is no longer on it. Told him to avoid it.   Recommendations: Dc RAL/TRV Start Genvoya 1 PO qday Gave him the copay card  Wilfred Lacy, PharmD Clinical Infectious [facility] 07/15/2014, 11:20 AM

## 2014-07-15 NOTE — Assessment & Plan Note (Signed)
He is getting counseling at work. He had SI recently. Feels like he is stable til he has meeting with his counselor. I offered to have him meet with [facility].

## 2014-07-15 NOTE — Progress Notes (Signed)
   Subjective:    Patient ID: Jon Shannon, male    DOB: 08-May-1966, 48 y.o.   MRN: [id number]  HPI 48 yo M with HIV+, previous stage IIa Hodgkin's lymphoma. He had onc f/u November 2015, clear.  Has DM as well from steroids. Recently seen for gout, treated with colchicine. Has completed this, did have GI upset with it. Still having hip, leg and foot pain and swelling. Being encouraged to lose wt. FSG have avg around 130-140. Has decreased his dietary meat, soft drinks, caffiene. Has numbness in feet and hands (related to CTX for his CA)..  Taking ISN/TRV Trig > 1000 Had ophtho this year  HIV 1 RNA QUANT (copies/mL)  Date Value  07/01/2014 <20  12/29/2013 <20  06/18/2013 <20   CD4 T CELL ABS (/uL)  Date Value  07/01/2014 730  12/29/2013 850  06/18/2013 750   Review of Systems  Constitutional: Negative for unexpected weight change.  Gastrointestinal: Negative for diarrhea and constipation.  Genitourinary: Negative for difficulty urinating.  Musculoskeletal: Positive for arthralgias.  Neurological: Positive for numbness.      Objective:   Physical Exam  Constitutional: He appears well-developed and well-nourished.  HENT:  Mouth/Throat: No oropharyngeal exudate.  Eyes: EOM are normal. Pupils are equal, round, and reactive to light.  Neck: Neck supple.  Cardiovascular: Normal rate, regular rhythm and normal heart sounds.   Pulmonary/Chest: Effort normal and breath sounds normal.  Abdominal: Soft. Bowel sounds are normal. He exhibits no distension. There is no tenderness.  Musculoskeletal: He exhibits no edema.       Feet:  Lymphadenopathy:    He has no cervical adenopathy.          Assessment & Plan:

## 2014-07-15 NOTE — Assessment & Plan Note (Signed)
He is watching diet. Greatly appreciate his PCP f/u.

## 2014-07-15 NOTE — Assessment & Plan Note (Signed)
Encourage diet. Greatly appreciate his PCP f/u.

## 2014-07-15 NOTE — Assessment & Plan Note (Signed)
My great appreciation to h/o for their f/u. He is doing well.

## 2014-07-15 NOTE — Assessment & Plan Note (Signed)
Encouraged him to lose wt  

## 2014-07-15 NOTE — Assessment & Plan Note (Signed)
This appears to be doing better. He will f/u with his PCP.

## 2014-07-21 ENCOUNTER — Encounter: Payer: Self-pay | Admitting: Infectious Diseases

## 2014-07-22 ENCOUNTER — Other Ambulatory Visit: Payer: Self-pay | Admitting: *Deleted

## 2014-07-22 DIAGNOSIS — B20 Human immunodeficiency virus [HIV] disease: Secondary | ICD-10-CM

## 2014-07-22 MED ORDER — RALTEGRAVIR POTASSIUM 400 MG PO TABS: 400.0000 mg | ORAL_TABLET | Freq: Two times a day (BID) | ORAL | Status: DC

## 2014-07-22 MED ORDER — EMTRICITABINE-TENOFOVIR DF 200-300 MG PO TABS: 1.0000 | ORAL_TABLET | Freq: Every day | ORAL | Status: DC

## 2014-07-29 ENCOUNTER — Telehealth: Payer: Self-pay | Admitting: *Deleted

## 2014-07-29 NOTE — Telephone Encounter (Signed)
Called [employer] for information on the appeal for Genvoya.  Per agent Nichola Sizer is not an option - it is a plan exclusion, not covered at all.  There is no option for a Prior Authorization, as the medication is not even on formulary at this time.  The lower cost alternative suggested by insurance is Triumeq (a tier II medication). [name], RN

## 2014-08-03 ENCOUNTER — Other Ambulatory Visit: Payer: Self-pay | Admitting: Licensed Clinical Social Worker

## 2014-08-03 ENCOUNTER — Encounter: Payer: Self-pay | Admitting: Infectious Diseases

## 2014-08-03 DIAGNOSIS — B20 Human immunodeficiency virus [HIV] disease: Secondary | ICD-10-CM

## 2014-08-03 MED ORDER — EMTRICITABINE-TENOFOVIR DF 200-300 MG PO TABS
1.0000 | ORAL_TABLET | Freq: Every day | ORAL | Status: DC
Start: 2014-08-03 — End: 2014-08-04

## 2014-08-03 MED ORDER — RALTEGRAVIR POTASSIUM 400 MG PO TABS
400.0000 mg | ORAL_TABLET | Freq: Two times a day (BID) | ORAL | Status: DC
Start: 2014-08-03 — End: 2014-08-04

## 2014-08-03 NOTE — Telephone Encounter (Signed)
Can stay on previous ART.  thanks

## 2014-08-04 ENCOUNTER — Other Ambulatory Visit: Payer: Self-pay | Admitting: Pharmacist Clinician (PhC)/ Clinical Pharmacy Specialist

## 2014-08-04 MED ORDER — ABACAVIR-DOLUTEGRAVIR-LAMIVUD 600-50-300 MG PO TABS: 1.0000 | ORAL_TABLET | Freq: Every day | ORAL | Status: DC

## 2014-08-04 NOTE — Progress Notes (Signed)
HPI: Jon Shannon is a 49 y.o. male who was recently here for his routine visit.   Allergies: Allergies  Allergen Reactions  . Shrimp [Shellfish Allergy] Anaphylaxis    Vitals:    Past Medical History: Past Medical History  Diagnosis Date  . Hyperlipidemia 08/07/2011  . Hodgkin's lymphoma 11/09/2011  . HIV INFECTION 11/30/2009    no history of opportunistic infection  . HYPERTENSION 11/30/2009  . Hypertriglyceridemia   . PTSD (post-traumatic stress disorder)   . DJD (degenerative joint disease)   . Hepatitis A     resolved.   Marland Kitchen Hx of radiation therapy 04/03/12 -04/29/12    Hodgkin's disease -neck  . Weight loss 05/29/2013  . Rectal bleeding 09/04/2013  . Elevated serum creatinine 06/15/2014    Social History: History   Social History  . Marital Status: Single    Spouse Name: N/A    Number of Children: 0  . Years of Education: 30   Occupational History  . 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 Smoking status: Former Smoker -- 1.00 packs/day    Types: Cigarettes    Quit date: 07/01/2011  . Smokeless tobacco: Never Used  . Alcohol Use: No  . Drug Use: No  . Sexual Activity: Not Currently     Comment: pt. declined condoms   Other Topics Concern  . Not on file   Social History Narrative    Previous Regimen:   Current Regimen: Truvada + RAL  Labs: HIV 1 RNA QUANT (copies/mL)  Date Value  07/01/2014 <20  12/29/2013 <20  06/18/2013 <20   CD4 T CELL ABS (/uL)  Date Value  07/01/2014 730  12/29/2013 850  06/18/2013 750   HEP B S AB (no units)  Date Value  05/29/2013 POS*   HEPATITIS B SURFACE AG (no units)  Date Value  05/29/2013 NEGATIVE   HCV AB (no units)  Date Value  11/30/2009 NEG    CrCl: Estimated Creatinine Clearance: 113.3 mL/min (by C-G formula based on Cr of 1.08).  Lipids:    Component Value Date/Time   CHOL 176 07/01/2014 0905   TRIG 1073* 07/01/2014 0905   HDL 23* 07/01/2014 0905   CHOLHDL 7.7 07/01/2014  0905   VLDL NOT CALC 07/01/2014 0905   LDLCALC NOT CALC 07/01/2014 0905    Assessment: 49 yo who was recently seen here for his HIV visit. He has been doing well on his current regimen. We decided to change him over to [place] but his insurance has not added it to formulary yet. He had some baseline scr issue so we are trying to avoid TDF. D/w Dr. Johnnye Sima, we are going to use Triumeq instead to avoid TDF. His HLA is neg.  Triumeq is on their formulary.   Recommendations: Stop RAL/TRV Start Triumeq 1 PO qday  Wilfred Lacy, PharmD Clinical Infectious [facility] 08/04/2014, 4:11 PM

## 2014-11-02 ENCOUNTER — Other Ambulatory Visit: Payer: 59

## 2014-11-02 DIAGNOSIS — E781 Pure hyperglyceridemia: Secondary | ICD-10-CM

## 2014-11-02 DIAGNOSIS — B20 Human immunodeficiency virus [HIV] disease: Secondary | ICD-10-CM

## 2014-11-02 DIAGNOSIS — Z113 Encounter for screening for infections with a predominantly sexual mode of transmission: Secondary | ICD-10-CM

## 2014-11-02 LAB — COMPLETE METABOLIC PANEL WITH GFR
ALBUMIN: 4.9 g/dL (ref 3.5–5.2)
ALK PHOS: 60 U/L (ref 39–117)
ALT: 32 U/L (ref 0–53)
AST: 24 U/L (ref 0–37)
BUN: 23 mg/dL (ref 6–23)
CHLORIDE: 99 meq/L (ref 96–112)
CO2: 27 mEq/L (ref 19–32)
Calcium: 10 mg/dL (ref 8.4–10.5)
Creat: 1.1 mg/dL (ref 0.50–1.35)
GFR, Est African American: >89 mL/min
GFR, Est Non African American: 78 mL/min
Glucose, Bld: 125 mg/dL — ABNORMAL HIGH (ref 70–99)
POTASSIUM: 4.1 meq/L (ref 3.5–5.3)
Sodium: 141 mEq/L (ref 135–145)
TOTAL PROTEIN: 7.7 g/dL (ref 6.0–8.3)
Total Bilirubin: 0.4 mg/dL (ref 0.2–1.2)

## 2014-11-02 LAB — CBC
HCT: 43.3 % (ref 39.0–52.0)
Hemoglobin: 15.4 g/dL (ref 13.0–17.0)
MCH: 31.6 pg (ref 26.0–34.0)
MCHC: 35.6 g/dL (ref 30.0–36.0)
MCV: 88.7 fL (ref 78.0–100.0)
MPV: 9.2 fL (ref 8.6–12.4)
Platelets: 273 10*3/uL (ref 150–400)
RBC: 4.88 MIL/uL (ref 4.22–5.81)
RDW: 14 % (ref 11.5–15.5)
WBC: 6.2 10*3/uL (ref 4.0–10.5)

## 2014-11-02 LAB — LIPID PANEL
Cholesterol: 202 mg/dL — ABNORMAL HIGH (ref 0–200)
HDL: 26 mg/dL — ABNORMAL LOW (ref >=40)
Total CHOL/HDL Ratio: 7.8 Ratio
Triglycerides: 912 mg/dL — ABNORMAL HIGH (ref <150)

## 2014-11-02 LAB — RPR

## 2014-11-03 LAB — T-HELPER CELL (CD4) - (RCID CLINIC ONLY)
CD4 T CELL ABS: 810 /uL (ref 400–2700)
CD4 T CELL HELPER: 24 % — AB (ref 33–55)

## 2014-11-03 LAB — HIV-1 RNA QUANT-NO REFLEX-BLD: HIV 1 RNA Quant: <20 copies/mL (ref <20)

## 2014-11-05 ENCOUNTER — Encounter: Payer: Self-pay | Admitting: Radiation Oncology

## 2014-11-05 ENCOUNTER — Ambulatory Visit
Admission: RE | Admit: 2014-11-05 | Discharge: 2014-11-05 | Disposition: A | Discharge status: Home / Self Care | Payer: 59 | Source: Ambulatory Visit | Attending: Radiation Oncology | Admitting: Radiation Oncology

## 2014-11-05 VITALS — BP 142/86 | HR 73 | Temp 97.9°F | Resp 20 | Wt 277.5 lb

## 2014-11-05 DIAGNOSIS — C8190 Hodgkin lymphoma, unspecified, unspecified site: Secondary | ICD-10-CM

## 2014-11-05 NOTE — Progress Notes (Signed)
Radiation Oncology         [phone]) [phone] ________________________________  Name: Jon Shannon MRN: [id number]  Date: 11/05/2014  DOB: [birth year]  Follow-Up Visit Note  CC: [name], MD  [name], MD  Diagnosis:   49 year old gentleman with stage IIA mixed cellularity Hodgkin's disease presenting in the neck s/p consolidative Radiotherapy following 4 x ABVD - 04/03/2012-04/29/2012 to 30.6 Gy in 18 fraction in the form of involved site radiotherapy (ISRT) using IMRT with TomoTherapy     Interval Since Last Radiation:  30 months  Narrative:  The patient returns today for routine follow-up. Reports decreased xerostomia.  Patient reports pain in his lower left jaw x 1 week which is worse when he "sticks out his tongue". He also states 2 weeks ago he "busted a blood vessel in his right eye". His right eye is red on inner area. He states it feels irritated but states his other eye does too. He denies history of injury to right eye and states it is no better than when he first noticed it. He denies issues with eating, swallowing. He states he is tired and his feet hurt from being up since early this morning. No other c/o reported he denies B symptoms                               ALLERGIES:  is allergic to shrimp.  Meds: Current Outpatient Prescriptions  Medication Sig Dispense Refill  . Abacavir-Dolutegravir-Lamivud (TRIUMEQ) 600-50-300 MG TABS Take 1 tablet by mouth daily with breakfast. 90 tablet 6  . ACCU-CHEK AVIVA PLUS test strip     . aspirin 81 MG tablet Take 81 mg by mouth daily.    . B Complex-C (B-COMPLEX WITH VITAMIN C) tablet Take 1 tablet by mouth daily.    . bisoprolol-hydrochlorothiazide (ZIAC) 5-6.25 MG per tablet Take 1 tablet by mouth every morning.     . Cholecalciferol (VITAMIN D3) 1000 UNITS tablet Take 1,000 Units by mouth daily.      Marland Kitchen DHEA 10 MG CAPS Take by mouth.    . diphenhydrAMINE (BENADRYL) 25 MG tablet Take 25 mg by mouth at bedtime as needed. For sleep     . escitalopram (LEXAPRO) 10 MG tablet Take 10 mg by mouth daily.      . famotidine (PEPCID AC) 10 MG chewable tablet Chew 10 mg by mouth 2 (two) times daily.    . fish oil-omega-3 fatty acids 1000 MG capsule Take 1 g by mouth daily.    Marland Kitchen GARLIC OIL PO Take 1 tablet by mouth daily.    Marland Kitchen ibuprofen (ADVIL,MOTRIN) 800 MG tablet Take by mouth every 8 (eight) hours as needed for pain.    . INVOKAMET 970 700 4175 MG TABS     . loratadine (CLARITIN) 10 MG tablet Take 10 mg by mouth daily.      . vitamin C (ASCORBIC ACID) 500 MG tablet Take 1,000 mg by mouth daily.     No current facility-administered medications for this encounter.    Physical Findings: The patient is in no acute distress. Patient is alert and oriented.  weight is 277 lb 8 oz (125.873 kg). His oral temperature is 97.9 F (36.6 C). His blood pressure is 142/86 and his pulse is 73. His respiration is 20. . The oral cavity and oropharynx are symmetric with moist mucosa. The neck and supraclavicular region are free of any appreciable lymphadenopathy.  No significant changes.  Lab Findings: Lab Results  Component Value Date   WBC 6.2 11/02/2014   HGB 15.4 11/02/2014   HCT 43.3 11/02/2014   MCV 88.7 11/02/2014   PLT 273 11/02/2014    Impression:  The patient is NED.  He has some xerostomia, no other post-radiation adverse effects  Plan:  Follow-up in one year.  This document serves as a record of services personally performed by Tyler Pita, MD. It was created on his behalf by Pearlie Oyster, a trained medical scribe. The creation of this record is based on the scribe's personal observations and the provider's statements to them. This document has been checked and approved by the attending provider.     _____________________________________  Sheral Apley. Tammi Klippel, M.D.

## 2014-11-05 NOTE — Progress Notes (Signed)
Patient reports pain in his lower left jaw x 1 week which is worse when he "sticks out his tongue". He also states 2 weeks ago he "busted a blood vessel in his right eye". His right eye is red on inner area. He states it feels irritated but states his other eye does too. He denies history of injury to right eye and states it is no better than when he first noticed it. He denies issues with eating, swallowing. He states he is tired and his feet hurt from being up since early this morning. No other c/o reported. BP 142/86 mmHg  Pulse 73  Temp(Src) 97.9 F (36.6 C) (Oral)  Resp 20  Wt 277 lb 8 oz (125.873 kg)

## 2014-11-09 ENCOUNTER — Emergency Department (HOSPITAL_COMMUNITY)
Admission: EM | Admit: 2014-11-09 | Discharge: 2014-11-09 | Disposition: A | Discharge status: Home / Self Care | Payer: 59 | Attending: Emergency Medicine | Admitting: Emergency Medicine

## 2014-11-09 ENCOUNTER — Encounter (HOSPITAL_COMMUNITY): Payer: Self-pay | Admitting: Emergency Medicine

## 2014-11-09 ENCOUNTER — Emergency Department (HOSPITAL_COMMUNITY): Payer: 59

## 2014-11-09 DIAGNOSIS — Y998 Other external cause status: Secondary | ICD-10-CM | POA: Diagnosis not present

## 2014-11-09 DIAGNOSIS — Z8659 Personal history of other mental and behavioral disorders: Secondary | ICD-10-CM | POA: Diagnosis not present

## 2014-11-09 DIAGNOSIS — S134XXA Sprain of ligaments of cervical spine, initial encounter: Secondary | ICD-10-CM | POA: Diagnosis not present

## 2014-11-09 DIAGNOSIS — Y92410 Unspecified street and highway as the place of occurrence of the external cause: Secondary | ICD-10-CM | POA: Insufficient documentation

## 2014-11-09 DIAGNOSIS — I10 Essential (primary) hypertension: Secondary | ICD-10-CM | POA: Insufficient documentation

## 2014-11-09 DIAGNOSIS — Z87891 Personal history of nicotine dependence: Secondary | ICD-10-CM | POA: Insufficient documentation

## 2014-11-09 DIAGNOSIS — Z923 Personal history of irradiation: Secondary | ICD-10-CM | POA: Insufficient documentation

## 2014-11-09 DIAGNOSIS — Z791 Long term (current) use of non-steroidal anti-inflammatories (NSAID): Secondary | ICD-10-CM | POA: Diagnosis not present

## 2014-11-09 DIAGNOSIS — E785 Hyperlipidemia, unspecified: Secondary | ICD-10-CM | POA: Insufficient documentation

## 2014-11-09 DIAGNOSIS — Z8619 Personal history of other infectious and parasitic diseases: Secondary | ICD-10-CM | POA: Insufficient documentation

## 2014-11-09 DIAGNOSIS — M549 Dorsalgia, unspecified: Secondary | ICD-10-CM

## 2014-11-09 DIAGNOSIS — Z21 Asymptomatic human immunodeficiency virus [HIV] infection status: Secondary | ICD-10-CM | POA: Diagnosis not present

## 2014-11-09 DIAGNOSIS — S3992XA Unspecified injury of lower back, initial encounter: Secondary | ICD-10-CM | POA: Diagnosis not present

## 2014-11-09 DIAGNOSIS — Y9389 Activity, other specified: Secondary | ICD-10-CM | POA: Insufficient documentation

## 2014-11-09 DIAGNOSIS — Z8719 Personal history of other diseases of the digestive system: Secondary | ICD-10-CM | POA: Insufficient documentation

## 2014-11-09 DIAGNOSIS — Z79899 Other long term (current) drug therapy: Secondary | ICD-10-CM | POA: Diagnosis not present

## 2014-11-09 DIAGNOSIS — S199XXA Unspecified injury of neck, initial encounter: Secondary | ICD-10-CM | POA: Diagnosis present

## 2014-11-09 DIAGNOSIS — Z8571 Personal history of Hodgkin lymphoma: Secondary | ICD-10-CM | POA: Insufficient documentation

## 2014-11-09 DIAGNOSIS — Z7982 Long term (current) use of aspirin: Secondary | ICD-10-CM | POA: Diagnosis not present

## 2014-11-09 MED ORDER — IBUPROFEN 400 MG PO TABS
800.0000 mg | ORAL_TABLET | Freq: Once | ORAL | Status: AC
  Administered 2014-11-09: 800 mg via ORAL
  Filled 2014-11-09: qty 2

## 2014-11-09 MED ORDER — CYCLOBENZAPRINE HCL 10 MG PO TABS
10.0000 mg | ORAL_TABLET | Freq: Two times a day (BID) | ORAL | Status: DC | PRN
Start: 2014-11-09 — End: 2014-12-14

## 2014-11-09 MED ORDER — NAPROXEN 375 MG PO TABS: 375.0000 mg | ORAL_TABLET | Freq: Two times a day (BID) | ORAL | Status: DC
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
## 2014-11-09 NOTE — Discharge Instructions (Signed)
Cervical Sprain °A cervical sprain is an injury in the neck in which the strong, fibrous tissues (ligaments) that connect your neck bones stretch or tear. Cervical sprains can range from mild to severe. Severe cervical sprains can cause the neck vertebrae to be unstable. This can lead to damage of the spinal cord and can result in serious nervous system problems. The amount of time it takes for a cervical sprain to get better depends on the cause and extent of the injury. Most cervical sprains heal in 1 to 3 weeks. °CAUSES  °Severe cervical sprains may be caused by:  °· Contact sport injuries (such as from football, rugby, wrestling, hockey, auto racing, gymnastics, diving, martial arts, or boxing).   °· Motor vehicle collisions.   °· Whiplash injuries. This is an injury from a sudden forward and backward whipping movement of the head and neck.  °· Falls.   °Mild cervical sprains may be caused by:  °· Being in an awkward position, such as while cradling a telephone between your ear and shoulder.   °· Sitting in a chair that does not offer proper support.   °· Working at a poorly designed computer station.   °· Looking up or down for long periods of time.   °SYMPTOMS  °· Pain, soreness, stiffness, or a burning sensation in the front, back, or sides of the neck. This discomfort may develop immediately after the injury or slowly, 24 hours or more after the injury.   °· Pain or tenderness directly in the middle of the back of the neck.   °· Shoulder or upper back pain.   °· Limited ability to move the neck.   °· Headache.   °· Dizziness.   °· Weakness, numbness, or tingling in the hands or arms.   °· Muscle spasms.   °· Difficulty swallowing or chewing.   °· Tenderness and swelling of the neck.   °DIAGNOSIS  °Most of the time your health care provider can diagnose a cervical sprain by taking your history and doing a physical exam. Your health care provider will ask about previous neck injuries and any known neck  problems, such as arthritis in the neck. X-rays may be taken to find out if there are any other problems, such as with the bones of the neck. Other tests, such as a CT scan or MRI, may also be needed.  °TREATMENT  °Treatment depends on the severity of the cervical sprain. Mild sprains can be treated with rest, keeping the neck in place (immobilization), and pain medicines. Severe cervical sprains are immediately immobilized. Further treatment is done to help with pain, muscle spasms, and other symptoms and may include: °· Medicines, such as pain relievers, numbing medicines, or muscle relaxants.   °· Physical therapy. This may involve stretching exercises, strengthening exercises, and posture training. Exercises and improved posture can help stabilize the neck, strengthen muscles, and help stop symptoms from returning.   °HOME CARE INSTRUCTIONS  °· Put ice on the injured area.   °¨ Put ice in a plastic bag.   °¨ Place a towel between your skin and the bag.   °¨ Leave the ice on for 15-20 minutes, 3-4 times a day.   °· If your injury was severe, you may have been given a cervical collar to wear. A cervical collar is a two-piece collar designed to keep your neck from moving while it heals. °¨ Do not remove the collar unless instructed by your health care provider. °¨ If you have long hair, keep it outside of the collar. °¨ Ask your health care provider before making any adjustments to your collar. Minor   adjustments may be required over time to improve comfort and reduce pressure on your chin or on the back of your head. °¨ If you are allowed to remove the collar for cleaning or bathing, follow your health care provider's instructions on how to do so safely. °¨ Keep your collar clean by wiping it with mild soap and water and drying it completely. If the collar you have been given includes removable pads, remove them every 1-2 days and hand wash them with soap and water. Allow them to air dry. They should be completely  dry before you wear them in the collar. °¨ If you are allowed to remove the collar for cleaning and bathing, wash and dry the skin of your neck. Check your skin for irritation or sores. If you see any, tell your health care provider. °¨ Do not drive while wearing the collar.   °· Only take over-the-counter or prescription medicines for pain, discomfort, or fever as directed by your health care provider.   °· Keep all follow-up appointments as directed by your health care provider.   °· Keep all physical therapy appointments as directed by your health care provider.   °· Make any needed adjustments to your workstation to promote good posture.   °· Avoid positions and activities that make your symptoms worse.   °· Warm up and stretch before being active to help prevent problems.   °SEEK MEDICAL CARE IF:  °· Your pain is not controlled with medicine.   °· You are unable to decrease your pain medicine over time as planned.   °· Your activity level is not improving as expected.   °SEEK IMMEDIATE MEDICAL CARE IF:  °· You develop any bleeding. °· You develop stomach upset. °· You have signs of an allergic reaction to your medicine.   °· Your symptoms get worse.   °· You develop new, unexplained symptoms.   °· You have numbness, tingling, weakness, or paralysis in any part of your body.   °MAKE SURE YOU:  °· Understand these instructions. °· Will watch your condition. °· Will get help right away if you are not doing well or get worse. °Document Released: 05/14/2007 Document Revised: 07/22/2013 Document Reviewed: 01/22/2013 °[employer]® Patient Information ©2015 [employer]. This information is not intended to replace advice given to you by your health care provider. Make sure you discuss any questions you have with your health care provider. ° °Motor Vehicle Collision °It is common to have multiple bruises and sore muscles after a motor vehicle collision (MVC). These tend to feel worse for the first 24 hours. You may have  the most stiffness and soreness over the first several hours. You may also feel worse when you wake up the first morning after your collision. After this point, you will usually begin to improve with each day. The speed of improvement often depends on the severity of the collision, the number of injuries, and the location and nature of these injuries. °HOME CARE INSTRUCTIONS °· Put ice on the injured area. °¨ Put ice in a plastic bag. °¨ Place a towel between your skin and the bag. °¨ Leave the ice on for 15-20 minutes, 3-4 times a day, or as directed by your health care provider. °· Drink enough fluids to keep your urine clear or pale yellow. Do not drink alcohol. °· Take a warm shower or bath once or twice a day. This will increase blood flow to sore muscles. °· You may return to activities as directed by your caregiver. Be careful when lifting, as this may aggravate neck or back   pain. °· Only take over-the-counter or prescription medicines for pain, discomfort, or fever as directed by your caregiver. Do not use aspirin. This may increase bruising and bleeding. °SEEK IMMEDIATE MEDICAL CARE IF: °· You have numbness, tingling, or weakness in the arms or legs. °· You develop severe headaches not relieved with medicine. °· You have severe neck pain, especially tenderness in the middle of the back of your neck. °· You have changes in bowel or bladder control. °· There is increasing pain in any area of the body. °· You have shortness of breath, light-headedness, dizziness, or fainting. °· You have chest pain. °· You feel sick to your stomach (nauseous), throw up (vomit), or sweat. °· You have increasing abdominal discomfort. °· There is blood in your urine, stool, or vomit. °· You have pain in your shoulder (shoulder strap areas). °· You feel your symptoms are getting worse. °MAKE SURE YOU: °· Understand these instructions. °· Will watch your condition. °· Will get help right away if you are not doing well or get  worse. °Document Released: 07/17/2005 Document Revised: 12/01/2013 Document Reviewed: 12/14/2010 °Exit[employer]® Patient Information ©2015 [employer]. This information is not intended to replace advice given to you by your health care provider. Make sure you discuss any questions you have with your health care provider. ° °

## 2014-11-16 ENCOUNTER — Encounter: Payer: Self-pay | Admitting: Infectious Diseases

## 2014-11-16 ENCOUNTER — Ambulatory Visit (INDEPENDENT_AMBULATORY_CARE_PROVIDER_SITE_OTHER): Payer: 59 | Admitting: Infectious Diseases

## 2014-11-16 VITALS — Ht 72.0 in | Wt 273.0 lb

## 2014-11-16 DIAGNOSIS — Z23 Encounter for immunization: Secondary | ICD-10-CM

## 2014-11-16 DIAGNOSIS — Z79899 Other long term (current) drug therapy: Secondary | ICD-10-CM

## 2014-11-16 DIAGNOSIS — Z113 Encounter for screening for infections with a predominantly sexual mode of transmission: Secondary | ICD-10-CM

## 2014-11-16 DIAGNOSIS — E1165 Type 2 diabetes mellitus with hyperglycemia: Secondary | ICD-10-CM | POA: Diagnosis not present

## 2014-11-16 DIAGNOSIS — B20 Human immunodeficiency virus [HIV] disease: Secondary | ICD-10-CM

## 2014-11-16 DIAGNOSIS — R634 Abnormal weight loss: Secondary | ICD-10-CM

## 2014-11-16 NOTE — Assessment & Plan Note (Signed)
He will f/u with his PCP. Greatly appreciate his PCP.

## 2014-11-16 NOTE — Addendum Note (Signed)
Addended by: [name] on: 11/16/2014 03:39 PM   Modules accepted: Orders

## 2014-11-16 NOTE — Assessment & Plan Note (Addendum)
He is doing very well on triumeq. Will watch his lipids, watch CV sx. Offered/refused condoms.  Given pnvx booster.  Will have him seen by dental.  Will see him back in 6 months.

## 2014-11-16 NOTE — Assessment & Plan Note (Signed)
Encouraged him to loose wt.
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
## 2014-11-16 NOTE — Assessment & Plan Note (Signed)
Doing ok, encouraged to keep trying.

## 2014-11-16 NOTE — Assessment & Plan Note (Signed)
Has ortho f/u today.

## 2014-12-14 ENCOUNTER — Other Ambulatory Visit (HOSPITAL_BASED_OUTPATIENT_CLINIC_OR_DEPARTMENT_OTHER): Payer: 59

## 2014-12-14 ENCOUNTER — Encounter: Payer: Self-pay | Admitting: Hematology and Oncology

## 2014-12-14 ENCOUNTER — Telehealth: Payer: Self-pay | Admitting: *Deleted

## 2014-12-14 ENCOUNTER — Ambulatory Visit (HOSPITAL_BASED_OUTPATIENT_CLINIC_OR_DEPARTMENT_OTHER): Payer: 59 | Admitting: Hematology and Oncology

## 2014-12-14 ENCOUNTER — Telehealth: Payer: Self-pay | Admitting: Hematology and Oncology

## 2014-12-14 VITALS — BP 143/69 | HR 77 | Temp 98.2°F | Resp 22 | Ht 72.0 in | Wt 276.0 lb

## 2014-12-14 DIAGNOSIS — B20 Human immunodeficiency virus [HIV] disease: Secondary | ICD-10-CM

## 2014-12-14 DIAGNOSIS — Z8571 Personal history of Hodgkin lymphoma: Secondary | ICD-10-CM

## 2014-12-14 DIAGNOSIS — C8190 Hodgkin lymphoma, unspecified, unspecified site: Secondary | ICD-10-CM

## 2014-12-14 DIAGNOSIS — R748 Abnormal levels of other serum enzymes: Secondary | ICD-10-CM | POA: Diagnosis not present

## 2014-12-14 DIAGNOSIS — R7989 Other specified abnormal findings of blood chemistry: Secondary | ICD-10-CM

## 2014-12-14 LAB — CBC WITH DIFFERENTIAL/PLATELET
BASO%: 0.3 % (ref 0.0–2.0)
BASOS ABS: 0 10*3/uL (ref 0.0–0.1)
EOS%: 0.7 % (ref 0.0–7.0)
Eosinophils Absolute: 0 10*3/uL (ref 0.0–0.5)
HEMATOCRIT: 45.8 % (ref 38.4–49.9)
HGB: 15.6 g/dL (ref 13.0–17.1)
LYMPH#: 3 10*3/uL (ref 0.9–3.3)
LYMPH%: 49.5 % — ABNORMAL HIGH (ref 14.0–49.0)
MCH: 31.5 pg (ref 27.2–33.4)
MCHC: 34 g/dL (ref 32.0–36.0)
MCV: 92.5 fL (ref 79.3–98.0)
MONO#: 0.3 10*3/uL (ref 0.1–0.9)
MONO%: 5.5 % (ref 0.0–14.0)
NEUT%: 44 % (ref 39.0–75.0)
NEUTROS ABS: 2.7 10*3/uL (ref 1.5–6.5)
Platelets: 273 10*3/uL (ref 140–400)
RBC: 4.95 10*6/uL (ref 4.20–5.82)
RDW: 13.6 % (ref 11.0–14.6)
WBC: 6.2 10*3/uL (ref 4.0–10.3)

## 2014-12-14 LAB — COMPREHENSIVE METABOLIC PANEL (CC13)
ALT: 40 U/L (ref 0–55)
ANION GAP: 13 meq/L — AB (ref 3–11)
AST: 26 U/L (ref 5–34)
Albumin: 4.5 g/dL (ref 3.5–5.0)
Alkaline Phosphatase: 62 U/L (ref 40–150)
BILIRUBIN TOTAL: 0.31 mg/dL (ref 0.20–1.20)
BUN: 19 mg/dL (ref 7.0–26.0)
CO2: 32 mEq/L — ABNORMAL HIGH (ref 22–29)
Calcium: 10.4 mg/dL (ref 8.4–10.4)
Chloride: 98 mEq/L (ref 98–109)
Creatinine: 1.6 mg/dL — ABNORMAL HIGH (ref 0.7–1.3)
EGFR: 48 mL/min/{1.73_m2} — AB (ref >90)
Glucose: 99 mg/dl (ref 70–140)
Potassium: 4.2 mEq/L (ref 3.5–5.1)
SODIUM: 143 meq/L (ref 136–145)
TOTAL PROTEIN: 8 g/dL (ref 6.4–8.3)

## 2014-12-14 LAB — LACTATE DEHYDROGENASE (CC13): LDH: 141 U/L (ref 125–245)

## 2014-12-14 NOTE — Telephone Encounter (Signed)
Gave and pritned appt sched and avs for pt for May 2017 °

## 2014-12-14 NOTE — Assessment & Plan Note (Signed)
Clinically, he has no signs of recurrence. I see him back in 12 months with repeat history, physical examination and blood work.

## 2014-12-14 NOTE — Progress Notes (Signed)
[place] OFFICE PROGRESS NOTE  Patient Care Team: [name], MD as PCP - General (Family Medicine) [name], MD as PCP - Infectious Diseases (Infectious Diseases) [name], MD (Otolaryngology) [name], MD as Referring Physician (Family Medicine)  SUMMARY OF ONCOLOGIC HISTORY:  Thispatient was diagnosed with classical Hodgkin lymphoma, stage IIA last year after presenting initially with palpable lymphadenopathy in his neck. The patient was treated with several cycles of ABVD from April 2013 to August 2013. Subsequently he underwent consolidation radiation treatment. His PET/CT scan from November 2014 showed no evidence of disease recurrence.  INTERVAL HISTORY: Please see below for problem oriented charting. He feels well. Denies new lymphadenopathy. He struggles with morbid obesity and difficulties controlling his weight He recurrent gout on his big toes recently with skin infection under the nailbed recently.  REVIEW OF SYSTEMS:   Constitutional: Denies fevers, chills or abnormal weight loss Eyes: Denies blurriness of vision Ears, nose, mouth, throat, and face: Denies mucositis or sore throat Respiratory: Denies cough, dyspnea or wheezes Cardiovascular: Denies palpitation, chest discomfort or lower extremity swelling Gastrointestinal:  Denies nausea, heartburn or change in bowel habits Skin: Denies abnormal skin rashes Lymphatics: Denies new lymphadenopathy or easy bruising Neurological:Denies numbness, tingling or new weaknesses Behavioral/Psych: Mood is stable, no new changes  All other systems were reviewed with the patient and are negative.  I have reviewed the past medical history, past surgical history, social history and family history with the patient and they are unchanged from previous note.  ALLERGIES:  is allergic to shrimp.  MEDICATIONS:  Current Outpatient Prescriptions  Medication Sig Dispense Refill  .  Abacavir-Dolutegravir-Lamivud (TRIUMEQ) 600-50-300 MG TABS Take 1 tablet by mouth daily with breakfast. 90 tablet 6  . ACCU-CHEK AVIVA PLUS test strip     . aspirin 81 MG tablet Take 81 mg by mouth daily.    . B Complex-C (B-COMPLEX WITH VITAMIN C) tablet Take 1 tablet by mouth daily.    . bisoprolol-hydrochlorothiazide (ZIAC) 5-6.25 MG per tablet Take 1 tablet by mouth every morning.     . Cholecalciferol (VITAMIN D3) 1000 UNITS tablet Take 1,000 Units by mouth daily.      . Chromium Picolinate (CHROMIUM PICOLATE PO) Take 1 tablet by mouth daily.    Marland Kitchen CINNAMON PO Take 1 capsule by mouth daily.    Marland Kitchen DHEA 10 MG CAPS Take by mouth.    . diphenhydrAMINE (BENADRYL) 25 MG tablet Take 25 mg by mouth at bedtime as needed. For sleep    . escitalopram (LEXAPRO) 10 MG tablet Take 10 mg by mouth daily.      . famotidine (PEPCID AC) 10 MG chewable tablet Chew 10 mg by mouth 2 (two) times daily.    . fish oil-omega-3 fatty acids 1000 MG capsule Take 1 g by mouth daily.    Marland Kitchen GARLIC OIL PO Take 1 tablet by mouth daily.    Marland Kitchen ibuprofen (ADVIL,MOTRIN) 800 MG tablet Take by mouth every 8 (eight) hours as needed for pain.    . INVOKAMET [phone] MG TABS     . loratadine (CLARITIN) 10 MG tablet Take 10 mg by mouth daily.      Marland Kitchen MITIGARE 0.6 MG CAPS     . Probiotic Product (PROBIOTIC DAILY PO) Take by mouth daily.    . vitamin C (ASCORBIC ACID) 500 MG tablet Take 1,000 mg by mouth daily.     No current facility-administered medications for this visit.    PHYSICAL  EXAMINATION: ECOG PERFORMANCE STATUS: 1 - Symptomatic but completely ambulatory  Filed Vitals:   12/14/14 1452  BP: 143/69  Pulse: 77  Temp: 98.2 F (36.8 C)  Resp: 22   Filed Weights   12/14/14 1452  Weight: 276 lb (125.193 kg)    GENERAL:alert, no distress and comfortable. He is morbidly obese SKIN: skin color, texture, turgor are normal, no rashes or significant lesions EYES: normal, Conjunctiva are pink and non-injected, sclera  clear OROPHARYNX:no exudate, no erythema and lips, buccal mucosa, and tongue normal  NECK: supple, thyroid normal size, non-tender, without nodularity LYMPH:  no palpable lymphadenopathy in the cervical, axillary or inguinal LUNGS: clear to auscultation and percussion with normal breathing effort HEART: regular rate & rhythm and no murmurs and no lower extremity edema ABDOMEN:abdomen soft, non-tender and normal bowel sounds Musculoskeletal:no cyanosis of digits and no clubbing  NEURO: alert & oriented x 3 with fluent speech, no focal motor/sensory deficits  LABORATORY DATA:  I have reviewed the data as listed    Component Value Date/Time   NA 143 12/14/2014 1435   NA 141 11/02/2014 1004   K 4.2 12/14/2014 1435   K 4.1 11/02/2014 1004   CL 99 11/02/2014 1004   CL 100 09/16/2012 1514   CO2 32* 12/14/2014 1435   CO2 27 11/02/2014 1004   GLUCOSE 99 12/14/2014 1435   GLUCOSE 125* 11/02/2014 1004   GLUCOSE 116* 09/16/2012 1514   BUN 19.0 12/14/2014 1435   BUN 23 11/02/2014 1004   CREATININE 1.6* 12/14/2014 1435   CREATININE 1.10 11/02/2014 1004   CREATININE 0.90 03/18/2012 0937   CALCIUM 10.4 12/14/2014 1435   CALCIUM 10.0 11/02/2014 1004   PROT 8.0 12/14/2014 1435   PROT 7.7 11/02/2014 1004   ALBUMIN 4.5 12/14/2014 1435   ALBUMIN 4.9 11/02/2014 1004   AST 26 12/14/2014 1435   AST 24 11/02/2014 1004   ALT 40 12/14/2014 1435   ALT 32 11/02/2014 1004   ALKPHOS 62 12/14/2014 1435   ALKPHOS 60 11/02/2014 1004   BILITOT 0.31 12/14/2014 1435   BILITOT 0.4 11/02/2014 1004   GFRNONAA 78 11/02/2014 1004   GFRAA >89 11/02/2014 1004    No results found for: SPEP, UPEP  Lab Results  Component Value Date   WBC 6.2 12/14/2014   NEUTROABS 2.7 12/14/2014   HGB 15.6 12/14/2014   HCT 45.8 12/14/2014   MCV 92.5 12/14/2014   PLT 273 12/14/2014      Chemistry      Component Value Date/Time   NA 143 12/14/2014 1435   NA 141 11/02/2014 1004   K 4.2 12/14/2014 1435   K 4.1  11/02/2014 1004   CL 99 11/02/2014 1004   CL 100 09/16/2012 1514   CO2 32* 12/14/2014 1435   CO2 27 11/02/2014 1004   BUN 19.0 12/14/2014 1435   BUN 23 11/02/2014 1004   CREATININE 1.6* 12/14/2014 1435   CREATININE 1.10 11/02/2014 1004   CREATININE 0.90 03/18/2012 0937   GLU 286* 11/24/2011 1446      Component Value Date/Time   CALCIUM 10.4 12/14/2014 1435   CALCIUM 10.0 11/02/2014 1004   ALKPHOS 62 12/14/2014 1435   ALKPHOS 60 11/02/2014 1004   AST 26 12/14/2014 1435   AST 24 11/02/2014 1004   ALT 40 12/14/2014 1435   ALT 32 11/02/2014 1004   BILITOT 0.31 12/14/2014 1435   BILITOT 0.4 11/02/2014 1004      ASSESSMENT & PLAN:  Stage IIA Classical Hodgkin's Lymphoma, Mixed Cellularity  Clinically, he has no signs of recurrence. I see him back in 12 months with repeat history, physical examination and blood work.    Elevated serum creatinine  His serum creatinine was elevated. Recommend discontinuation of NSAID and his current blood pressure medication. I will forward this note to his primary care provider for blood pressure monitoring and kidney function test monitoring in the future   Human immunodeficiency virus (HIV) disease He is receiving anti-retroviral treatment. CBC is within normal limits. He will continue retroviral treatment under the guidance of his infectious disease physician.    Orders Placed This Encounter  Procedures  . CBC with Differential/Platelet    Standing Status: Future     Number of Occurrences:      Standing Expiration Date: 01/18/2016  . Comprehensive metabolic panel    Standing Status: Future     Number of Occurrences:      Standing Expiration Date: 01/18/2016  . Lactate dehydrogenase    Standing Status: Future     Number of Occurrences:      Standing Expiration Date: 01/18/2016   All questions were answered. The patient knows to call the clinic with any problems, questions or concerns. No barriers to learning was detected. I spent 15  minutes counseling the patient face to face. The total time spent in the appointment was 20 minutes and more than 50% was on counseling and review of test results     [facility], [place], MD 12/14/2014 4:23 PM  ,

## 2014-12-14 NOTE — Assessment & Plan Note (Signed)
He is receiving anti-retroviral treatment. CBC is within normal limits. He will continue retroviral treatment under the guidance of his infectious disease physician.

## 2014-12-14 NOTE — Telephone Encounter (Signed)
Instructed pt per Dr. Alvy Bimler to stop ibuprofen and Ziac due to elevated Creatinine 1.6 today.  F/u w/ PCP for BP management using another drug that is safer on kidneys.  Dr. Alvy Bimler will send note to PCP.   Pt verbalized understanding.

## 2014-12-14 NOTE — Assessment & Plan Note (Signed)
His serum creatinine was elevated. Recommend discontinuation of NSAID and his current blood pressure medication. I will forward this note to his primary care provider for blood pressure monitoring and kidney function test monitoring in the future

## 2014-12-16 ENCOUNTER — Telehealth: Payer: Self-pay | Admitting: *Deleted

## 2014-12-16 NOTE — Telephone Encounter (Signed)
TC from patient regarding message left by Dr. Calton Dach nurse yesterday.  Patient wanted to clarify which medications he is supposed to stop. His father got the message but was unclear as to exactly which medications he was to stop.  Reviewed note from [place], RN with patient thoroughly. Patient voiced understanding. He stated he would contact his PCP for BP management.

## 2015-01-15 ENCOUNTER — Encounter: Payer: Self-pay | Admitting: Infectious Diseases

## 2015-05-05 ENCOUNTER — Other Ambulatory Visit: Payer: 59

## 2015-05-05 DIAGNOSIS — B20 Human immunodeficiency virus [HIV] disease: Secondary | ICD-10-CM

## 2015-05-05 DIAGNOSIS — Z79899 Other long term (current) drug therapy: Secondary | ICD-10-CM

## 2015-05-05 DIAGNOSIS — Z113 Encounter for screening for infections with a predominantly sexual mode of transmission: Secondary | ICD-10-CM

## 2015-05-05 LAB — COMPREHENSIVE METABOLIC PANEL
ALK PHOS: 54 U/L (ref 40–115)
ALT: 35 U/L (ref 9–46)
AST: 25 U/L (ref 10–35)
Albumin: 4.8 g/dL (ref 3.6–5.1)
BILIRUBIN TOTAL: 0.4 mg/dL (ref 0.2–1.2)
BUN: 21 mg/dL (ref 7–25)
CO2: 31 mmol/L (ref 20–31)
CREATININE: 1.14 mg/dL (ref 0.70–1.33)
Calcium: 9.8 mg/dL (ref 8.6–10.3)
Chloride: 99 mmol/L (ref 98–110)
GLUCOSE: 116 mg/dL — AB (ref 65–99)
Potassium: 4.2 mmol/L (ref 3.5–5.3)
Sodium: 140 mmol/L (ref 135–146)
Total Protein: 7.2 g/dL (ref 6.1–8.1)

## 2015-05-05 LAB — CBC
HEMATOCRIT: 43.2 % (ref 39.0–52.0)
Hemoglobin: 15.2 g/dL (ref 13.0–17.0)
MCH: 30.6 pg (ref 26.0–34.0)
MCHC: 35.2 g/dL (ref 30.0–36.0)
MCV: 86.9 fL (ref 78.0–100.0)
MPV: 9.3 fL (ref 8.6–12.4)
Platelets: 248 10*3/uL (ref 150–400)
RBC: 4.97 MIL/uL (ref 4.22–5.81)
RDW: 13.3 % (ref 11.5–15.5)
WBC: 6 10*3/uL (ref 4.0–10.5)

## 2015-05-05 LAB — LIPID PANEL
Cholesterol: 182 mg/dL (ref 125–200)
HDL: 28 mg/dL — ABNORMAL LOW (ref >=40)
Total CHOL/HDL Ratio: 6.5 Ratio — ABNORMAL HIGH (ref <=5.0)
Triglycerides: 594 mg/dL — ABNORMAL HIGH (ref <150)

## 2015-05-06 LAB — T-HELPER CELL (CD4) - (RCID CLINIC ONLY)
CD4 T CELL HELPER: 28 % — AB (ref 33–55)
CD4 T Cell Abs: 900 /uL (ref 400–2700)

## 2015-05-06 LAB — RPR

## 2015-05-07 LAB — HIV-1 RNA QUANT-NO REFLEX-BLD
HIV 1 RNA Quant: <20 copies/mL (ref <20)
HIV-1 RNA Quant, Log: <1.3 {Log} (ref <1.30)

## 2015-05-19 ENCOUNTER — Ambulatory Visit (INDEPENDENT_AMBULATORY_CARE_PROVIDER_SITE_OTHER): Payer: 59 | Admitting: Infectious Diseases

## 2015-05-19 VITALS — Wt 271.0 lb

## 2015-05-19 DIAGNOSIS — F341 Dysthymic disorder: Secondary | ICD-10-CM | POA: Diagnosis not present

## 2015-05-19 DIAGNOSIS — B20 Human immunodeficiency virus [HIV] disease: Secondary | ICD-10-CM | POA: Diagnosis not present

## 2015-05-19 DIAGNOSIS — C8190 Hodgkin lymphoma, unspecified, unspecified site: Secondary | ICD-10-CM

## 2015-05-19 DIAGNOSIS — E11621 Type 2 diabetes mellitus with foot ulcer: Secondary | ICD-10-CM | POA: Diagnosis not present

## 2015-05-19 DIAGNOSIS — L97509 Non-pressure chronic ulcer of other part of unspecified foot with unspecified severity: Secondary | ICD-10-CM

## 2015-05-19 NOTE — Assessment & Plan Note (Signed)
Doing well.  Has Onc f/u next year

## 2015-05-19 NOTE — Assessment & Plan Note (Signed)
He is doing very well.  Will continue triumeq Has gotten flu shot.  Has gotten colon.  Will see him back next month.

## 2015-05-19 NOTE — Progress Notes (Signed)
   Subjective:    Patient ID: Jon Shannon, male    DOB: 02/28/1966, 49 y.o.   MRN: [id number]  HPI 49 yo M with HIV+, previous stage IIa Hodgkin's lymphoma. He had onc f/u May 2016, clear.  Has DM as well from steroids. Hypertriglyceridemia.  Previously seen for gout, treated with colchicine.  HIV 1 RNA QUANT (copies/mL)  Date Value  05/05/2015 <20  11/02/2014 <20  07/01/2014 <20   CD4 T CELL ABS (/uL)  Date Value  05/05/2015 900  11/02/2014 810  07/01/2014 730   Taking triumeq.  Is overwrought with depression.  Wondering how long it would take for him to die if he quit taking his ART.  Has gotten flu, seen ophtho, podiatry, has had f/u with PCP.  Has been getting treatment for foot ulcer ([facility], [place]).  Last A1C 5.9%, FSG 138 this AM. Trig are down from 912 to 594. Insurance has been an issue for his new anti-lipemic.   Review of Systems  Psychiatric/Behavioral: Positive for sleep disturbance, self-injury, dysphoric mood and decreased concentration. The patient is nervous/anxious.    Please see HPI. 12 point ROS o/w (-)     Objective:   Physical Exam  Constitutional: He appears well-developed and well-nourished.  Musculoskeletal:       Feet:  Psychiatric: His mood appears anxious. He exhibits a depressed mood.  insomnia, abn dreams,  He contracts for safety          Assessment & Plan:

## 2015-05-19 NOTE — Assessment & Plan Note (Signed)
He appears to be doing very well.  Has seen ophtho, podiatry.  Will f/u with PCP

## 2015-05-19 NOTE — Assessment & Plan Note (Signed)
Spent 30 min counseling him, positive reinforcement, coping skills, with pt. Had him seen by Leveda Anna as well. He will see her again tomorrow.  Again, he contracts for safety.

## 2015-05-20 ENCOUNTER — Ambulatory Visit: Payer: 59 | Admitting: *Deleted

## 2015-05-20 DIAGNOSIS — F418 Other specified anxiety disorders: Secondary | ICD-10-CM
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
## 2015-06-09 ENCOUNTER — Ambulatory Visit: Payer: 59 | Admitting: *Deleted

## 2015-06-09 ENCOUNTER — Other Ambulatory Visit: Payer: Self-pay | Admitting: *Deleted

## 2015-06-09 DIAGNOSIS — R454 Irritability and anger: Secondary | ICD-10-CM

## 2015-06-09 MED ORDER — ABACAVIR-DOLUTEGRAVIR-LAMIVUD 600-50-300 MG PO TABS: 1.0000 | ORAL_TABLET | Freq: Every day | ORAL | Status: DC

## 2015-06-09 NOTE — BH Specialist Note (Signed)
Jon Shannon was present this am for his scheduled appointment.  Client was oriented times four with good affect and dress.  Client was in better spirits today and was a bit calmer.  Client did however at one point raise his voice and communicated negativity about himself.  Client denied wanting to hurt himself or anyone else but admitted that he did not like himself very much and he recognized that this was hurting him.  Counselor educated client on self care and the importance of making sure that he had adequate self worth.  Counselor shared with client ways to enhance his self esteem and build upon learning to view himself in a more positive way.  Client provided counselor the top three areas that he feels cause him the biggest problem in his life.  Client indicated: family arguments/disagreements in his how, being alone, and work related issues. At times client responded well but other times client would blame counselor and at one point said that counselor "fucked the session up" because of a compliment.  When counselor asked why he said that, he indicated he does not take compliments well.  Counselor discussed with client the area of being alone and how that makes him feel.  Client shared that he wanted to have someone to be by his side on a regular basis.  Counselor had client think of ways to meet new people and establish some new quality relationships.  Client agreed that he was not placing himself in situations to meet new people and develop those kinds of relationships.  Counselor provided support and encouragement accordingly. Client made another appointment for two weeks out.   Rolena Infante, MA, [facility]

## 2015-06-21 ENCOUNTER — Ambulatory Visit (INDEPENDENT_AMBULATORY_CARE_PROVIDER_SITE_OTHER): Payer: 59 | Admitting: Infectious Diseases

## 2015-06-21 ENCOUNTER — Encounter: Payer: Self-pay | Admitting: Infectious Diseases

## 2015-06-21 VITALS — BP 129/86 | HR 67 | Temp 97.7°F | Ht 72.0 in | Wt 276.8 lb

## 2015-06-21 DIAGNOSIS — C8190 Hodgkin lymphoma, unspecified, unspecified site: Secondary | ICD-10-CM | POA: Diagnosis not present

## 2015-06-21 DIAGNOSIS — F341 Dysthymic disorder: Secondary | ICD-10-CM

## 2015-06-21 DIAGNOSIS — B20 Human immunodeficiency virus [HIV] disease: Secondary | ICD-10-CM | POA: Diagnosis not present

## 2015-06-21 DIAGNOSIS — Z79899 Other long term (current) drug therapy: Secondary | ICD-10-CM

## 2015-06-21 DIAGNOSIS — L97509 Non-pressure chronic ulcer of other part of unspecified foot with unspecified severity: Secondary | ICD-10-CM

## 2015-06-21 DIAGNOSIS — Z113 Encounter for screening for infections with a predominantly sexual mode of transmission: Secondary | ICD-10-CM

## 2015-06-21 DIAGNOSIS — E11621 Type 2 diabetes mellitus with foot ulcer: Secondary | ICD-10-CM

## 2015-06-21 MED ORDER — ABACAVIR-DOLUTEGRAVIR-LAMIVUD 600-50-300 MG PO TABS: 1.0000 | ORAL_TABLET | Freq: Every day | ORAL | Status: DC

## 2015-06-21 NOTE — Assessment & Plan Note (Signed)
To have f/u next year, doing well.

## 2015-06-21 NOTE — Progress Notes (Signed)
   Subjective:    Patient ID: Jon Shannon, male    DOB: 1966-03-15, 49 y.o.   MRN: [id number]  HPI 49 yo M with HIV+, previous stage IIa Hodgkin's lymphoma. He had onc f/u May 2016, clear (plan fo rf/u May 2017).  Has DM as well from steroids. Hypertriglyceridemia.  Previously seen for gout, treated with colchicine. He has multiple stressors (work, family). At his previous visit, had increased stress and has since been seeing counselor. He feels like this has helped. Feels like his mood has gone through phases. Having "alot of wild dreams", does not always feel rested on awakening.  Has gotten back to gym. Has felt positive about his. Is looking into going on [place] cruise next year.  Has been released by his podiatrist, will see again next month.    HIV 1 RNA QUANT (copies/mL)  Date Value  05/05/2015 <20  11/02/2014 <20  07/01/2014 <20   CD4 T CELL ABS (/uL)  Date Value  05/05/2015 900  11/02/2014 810  07/01/2014 730    Review of Systems  Constitutional: Negative for appetite change and unexpected weight change.  Gastrointestinal: Negative for diarrhea and constipation.  Genitourinary: Negative for difficulty urinating.  Musculoskeletal: Positive for myalgias.  Psychiatric/Behavioral: Negative for sleep disturbance, self-injury and dysphoric mood. The patient is not nervous/anxious.        Objective:   Physical Exam  Constitutional: He appears well-developed and well-nourished.  HENT:  Mouth/Throat: No oropharyngeal exudate.  Eyes: EOM are normal. Pupils are equal, round, and reactive to light.  Neck: Neck supple.  Cardiovascular: Normal rate, regular rhythm and normal heart sounds.   Abdominal: Soft. Bowel sounds are normal. There is no tenderness. There is no rebound.  Lymphadenopathy:    He has no cervical adenopathy.  Psychiatric: He has a normal mood and affect. His behavior is normal. Judgment and thought content normal.      Assessment & Plan:

## 2015-06-21 NOTE — Assessment & Plan Note (Signed)
Encouraged him to watch diet, exercise, watch weight.

## 2015-06-21 NOTE — Assessment & Plan Note (Signed)
Doing well, continue current art Will recheck his labs at f/u Not sexually active.

## 2015-06-21 NOTE — Assessment & Plan Note (Signed)
Is doing better.  My great apprecaiton to jodi.  Spoke about ways to cognitively improve his mood.

## 2015-06-30 ENCOUNTER — Ambulatory Visit: Payer: 59 | Admitting: *Deleted

## 2015-07-08 ENCOUNTER — Ambulatory Visit: Payer: 59 | Admitting: *Deleted

## 2015-07-09 ENCOUNTER — Encounter: Payer: Self-pay | Admitting: Infectious Diseases

## 2015-07-29 ENCOUNTER — Encounter: Payer: Self-pay | Admitting: Infectious Diseases

## 2015-08-06 ENCOUNTER — Ambulatory Visit: Payer: 59 | Admitting: *Deleted

## 2015-08-06 DIAGNOSIS — F603 Borderline personality disorder: Secondary | ICD-10-CM

## 2015-08-06 NOTE — BH Specialist Note (Signed)
Jon Shannon was present for his scheduled appointment today with counselor.  Client was oriented times four with good affect and dress.  Client was alert and talkative.  Client shared that things were going ok but said that he had had a few conflicts with his nephew who lives in the home with him and his mom over the holidays.  Counselor worked on addressing "dialectical" thinking.  Counselor educated client as to this term and provided information to support this way of thinking.  Client was somewhat receptive but occasionally disagree and came across dishonest for the sake of disagreeing.  Counselor gently confronted client and acknowledged the contradiction.  Client was receptive and spoke respectfully.  Counselor processed dialectical thinking with client educating client on meaning such as: black and white" thinking, avoiding blame and assumptions, learning to be more flexible and approachable, letting go of "self-righteous" indignation etc.  Counselor processed each with client until full understanding was gained. Client worked cooperatively and Hospital doctor in a role play.  The intervention for client today proved somewhat affective. Counselor made another appointment with client for next week.   Rolena Infante, MA [facility]

## 2015-08-20 ENCOUNTER — Ambulatory Visit: Payer: 59 | Admitting: *Deleted

## 2015-08-20 DIAGNOSIS — F603 Borderline personality disorder: Secondary | ICD-10-CM

## 2015-08-23 ENCOUNTER — Ambulatory Visit: Payer: 59 | Admitting: *Deleted

## 2015-08-23 NOTE — BH Specialist Note (Signed)
Tremell showed for his scheduled appointment with counselor.  Client was oriented times four with good affect and dress.  Client forgot homework assigned by counselor which was to complete a gratitude journal. Counselor reviewed with client dialectical thinking as well as "black and white" thinking.  Counselor introduced guidelines for dialectical thinking such as: moving away from "either or" thinking to "both and" thinking, looking at all sides prior passing judgement, don't assume what others are thinking, and don't expect others to know what you are thinking.  Counselor used reflective listening skills with client as he discussed these topics.client was a little more cooperative today and took the time to ponder what was being suggested and recommended when dealing with people. Although at times, client would comment on his annoyance with counselor, for the most part he was engaged appropriately. Client made another appointment for next week.   Rolena Infante, MA [facility]

## 2015-10-06 ENCOUNTER — Other Ambulatory Visit: Payer: 59

## 2015-10-06 DIAGNOSIS — Z79899 Other long term (current) drug therapy: Secondary | ICD-10-CM

## 2015-10-06 DIAGNOSIS — B20 Human immunodeficiency virus [HIV] disease: Secondary | ICD-10-CM

## 2015-10-06 DIAGNOSIS — Z113 Encounter for screening for infections with a predominantly sexual mode of transmission: Secondary | ICD-10-CM

## 2015-10-06 LAB — COMPLETE METABOLIC PANEL WITH GFR
ALT: 31 U/L (ref 9–46)
AST: 24 U/L (ref 10–35)
Albumin: 4.7 g/dL (ref 3.6–5.1)
Alkaline Phosphatase: 55 U/L (ref 40–115)
BILIRUBIN TOTAL: 0.4 mg/dL (ref 0.2–1.2)
BUN: 23 mg/dL (ref 7–25)
CO2: 25 mmol/L (ref 20–31)
CREATININE: 1.15 mg/dL (ref 0.70–1.33)
Calcium: 9.6 mg/dL (ref 8.6–10.3)
Chloride: 98 mmol/L (ref 98–110)
GFR, EST AFRICAN AMERICAN: 85 mL/min (ref >=60)
GFR, Est Non African American: 74 mL/min (ref >=60)
GLUCOSE: 126 mg/dL — AB (ref 65–99)
Potassium: 3.8 mmol/L (ref 3.5–5.3)
Sodium: 136 mmol/L (ref 135–146)
TOTAL PROTEIN: 7.3 g/dL (ref 6.1–8.1)

## 2015-10-06 LAB — CBC
HEMATOCRIT: 42.6 % (ref 39.0–52.0)
Hemoglobin: 14.7 g/dL (ref 13.0–17.0)
MCH: 30.7 pg (ref 26.0–34.0)
MCHC: 34.5 g/dL (ref 30.0–36.0)
MCV: 88.9 fL (ref 78.0–100.0)
MPV: 9.2 fL (ref 8.6–12.4)
PLATELETS: 242 10*3/uL (ref 150–400)
RBC: 4.79 MIL/uL (ref 4.22–5.81)
RDW: 13.7 % (ref 11.5–15.5)
WBC: 5.9 10*3/uL (ref 4.0–10.5)

## 2015-10-06 LAB — LIPID PANEL
Cholesterol: 193 mg/dL (ref 125–200)
HDL: 25 mg/dL — ABNORMAL LOW (ref >=40)
Total CHOL/HDL Ratio: 7.7 Ratio — ABNORMAL HIGH (ref <=5.0)
Triglycerides: 943 mg/dL — ABNORMAL HIGH (ref <150)

## 2015-10-07 LAB — URINE CYTOLOGY ANCILLARY ONLY
CHLAMYDIA, DNA PROBE: NEGATIVE
Neisseria Gonorrhea: NEGATIVE

## 2015-10-08 LAB — T-HELPER CELL (CD4) - (RCID CLINIC ONLY)
CD4 % Helper T Cell: 27 % — ABNORMAL LOW (ref 33–55)
CD4 T Cell Abs: 1010 /uL (ref 400–2700)

## 2015-10-08 LAB — RPR

## 2015-10-10 LAB — HIV-1 RNA QUANT-NO REFLEX-BLD
HIV 1 RNA Quant: 41 copies/mL — ABNORMAL HIGH (ref <20)
HIV-1 RNA Quant, Log: 1.61 Log copies/mL — ABNORMAL HIGH (ref <1.30)

## 2015-10-17 ENCOUNTER — Encounter: Payer: Self-pay | Admitting: Infectious Diseases

## 2015-10-20 ENCOUNTER — Telehealth: Payer: Self-pay | Admitting: Nutrition

## 2015-10-20 ENCOUNTER — Ambulatory Visit (INDEPENDENT_AMBULATORY_CARE_PROVIDER_SITE_OTHER): Payer: 59 | Admitting: Infectious Diseases

## 2015-10-20 ENCOUNTER — Encounter: Payer: Self-pay | Admitting: Infectious Diseases

## 2015-10-20 VITALS — BP 127/88 | HR 71 | Temp 97.8°F | Ht 72.0 in | Wt 268.0 lb

## 2015-10-20 DIAGNOSIS — B20 Human immunodeficiency virus [HIV] disease: Secondary | ICD-10-CM

## 2015-10-20 DIAGNOSIS — F17200 Nicotine dependence, unspecified, uncomplicated: Secondary | ICD-10-CM | POA: Diagnosis not present

## 2015-10-20 DIAGNOSIS — F4310 Post-traumatic stress disorder, unspecified: Secondary | ICD-10-CM | POA: Diagnosis not present

## 2015-10-20 DIAGNOSIS — E781 Pure hyperglyceridemia: Secondary | ICD-10-CM

## 2015-10-20 DIAGNOSIS — L97509 Non-pressure chronic ulcer of other part of unspecified foot with unspecified severity: Secondary | ICD-10-CM

## 2015-10-20 DIAGNOSIS — E11621 Type 2 diabetes mellitus with foot ulcer: Secondary | ICD-10-CM

## 2015-10-20 DIAGNOSIS — E785 Hyperlipidemia, unspecified: Secondary | ICD-10-CM

## 2015-10-20 DIAGNOSIS — C8190 Hodgkin lymphoma, unspecified, unspecified site: Secondary | ICD-10-CM

## 2015-10-20 NOTE — Progress Notes (Signed)
   Subjective:    Patient ID: Jon Shannon, male    DOB: 03-01-1966, 50 y.o.   MRN: [id number]  HPI 50 yo M with HIV+, previous stage IIa Hodgkin's lymphoma. He had onc f/u May 2016, clear (plan fo rf/u May 2017).  Has DM as well from steroids. Severe hypertriglyceridemia. Has PCP in [place].   Is taking lopid. Needs to start statin (via PCP). Wants to see dietician.  Worried about his blip.    HIV 1 RNA QUANT (copies/mL)  Date Value  10/06/2015 41*  05/05/2015 <20  11/02/2014 <20   CD4 T CELL ABS (/uL)  Date Value  10/06/2015 1010  05/05/2015 900  11/02/2014 810   Has been exercising.   Review of Systems  Constitutional: Negative for appetite change and unexpected weight change.  Respiratory: Negative for shortness of breath.   Cardiovascular: Negative for chest pain.  Gastrointestinal: Negative for diarrhea and constipation.  Genitourinary: Negative for difficulty urinating.       Objective:   Physical Exam  Constitutional: He appears well-developed and well-nourished.  HENT:  Mouth/Throat: No oropharyngeal exudate.  Eyes: EOM are normal. Pupils are equal, round, and reactive to light.  Neck: Neck supple.  Cardiovascular: Normal rate, regular rhythm and normal heart sounds.   Pulmonary/Chest: Effort normal and breath sounds normal.  Abdominal: Soft. Bowel sounds are normal. There is no tenderness. There is no rebound.  Musculoskeletal: He exhibits no edema.  Lymphadenopathy:    He has no cervical adenopathy.      Assessment & Plan:

## 2015-10-20 NOTE — Assessment & Plan Note (Signed)
FSG have been <145. Mostly 120s Has been trying vinegar/honey/garlic.  Has had ophtho this year.

## 2015-10-20 NOTE — Assessment & Plan Note (Signed)
f/u is next month.

## 2015-10-20 NOTE — Assessment & Plan Note (Signed)
Will f/u with his PCP.  Add statin?

## 2015-10-20 NOTE — Assessment & Plan Note (Signed)
Quit

## 2015-10-20 NOTE — Assessment & Plan Note (Signed)
Will get her into nutrition.

## 2015-10-20 NOTE — Addendum Note (Signed)
Addended by: Dolan Amen D on: 10/20/2015 10:59 AM   Modules accepted: Orders

## 2015-10-20 NOTE — Assessment & Plan Note (Signed)
Has had f/u with Leveda Anna.  Appears to be doing well.

## 2015-10-20 NOTE — Addendum Note (Signed)
Addended by: [name] on: 10/20/2015 10:46 AM   Modules accepted: Orders

## 2015-10-20 NOTE — Telephone Encounter (Signed)
Patient contacted me requesting appointment for education on diabetes, and lowering his cholesterol. I referred [facility] to [facility] for education. Patient continued to try to get an appointment with me since I have worked with him while he had cancer. Offered to mail patient information and again enforced need for patient to receive education at [facility] or equivalent.

## 2015-10-20 NOTE — Progress Notes (Signed)
Patient ID: Jon Shannon, male   DOB: 03/09/1966, 50 y.o.   MRN: [id number] HPI: Jon Shannon is a 50 y.o. male who is here for his routine HIV visit.   Allergies: Allergies  Allergen Reactions  . Shrimp [Shellfish Allergy] Anaphylaxis    Lobster, too    Vitals: Temp: 97.8 F (36.6 C) (03/22 0936) Temp Source: Oral (03/22 0936) BP: 127/88 mmHg (03/22 0936) Pulse Rate: 71 (03/22 0936)  Past Medical History: Past Medical History  Diagnosis Date  . Hyperlipidemia 08/07/2011  . Hodgkin's lymphoma ([place]) 11/09/2011  . HIV INFECTION 11/30/2009    no history of opportunistic infection  . HYPERTENSION 11/30/2009  . Hypertriglyceridemia   . PTSD (post-traumatic stress disorder)   . DJD (degenerative joint disease)   . Hepatitis A     resolved.   Marland Kitchen Hx of radiation therapy 04/03/12 -04/29/12    Hodgkin's disease -neck  . Weight loss 05/29/2013  . Rectal bleeding 09/04/2013  . Elevated serum creatinine 06/15/2014    Social History: Social History   Social History  . Marital Status: Single    Spouse Name: N/A  . Number of Children: 0  . Years of Education: 17   Occupational History  . 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 Smoking status: Former Smoker -- 1.00 packs/day    Types: Cigarettes    Quit date: 07/01/2011  . Smokeless tobacco: Never Used  . Alcohol Use: No  . Drug Use: No  . Sexual Activity: Not Currently     Comment: pt. declined condoms   Other Topics Concern  . None   Social History Narrative    Previous Regimen: TRV/ISN  Current Regimen: Triumeq  Labs: HIV 1 RNA QUANT (copies/mL)  Date Value  10/06/2015 41*  05/05/2015 <20  11/02/2014 <20   CD4 T CELL ABS (/uL)  Date Value  10/06/2015 1010  05/05/2015 900  11/02/2014 810   HEP B S AB (no units)  Date Value  05/29/2013 POS*   HEPATITIS B SURFACE AG (no units)  Date Value  05/29/2013 NEGATIVE   HCV AB (no units)  Date Value  11/30/2009 NEG    CrCl: Estimated  Creatinine Clearance: 103.5 mL/min (by C-G formula based on Cr of 1.15).  Lipids:    Component Value Date/Time   CHOL 193 10/06/2015 1710   TRIG 943* 10/06/2015 1710   HDL 25* 10/06/2015 1710   CHOLHDL 7.7* 10/06/2015 1710   VLDL NOT CALC 10/06/2015 1710   LDLCALC NOT CALC 10/06/2015 1710    Assessment: Jon Shannon is here for his routine HIV f/u. His VL is low but not undetectable. He has had long hx of hyperlipidemia with his Trig now approaching 1000. Apparently, he is on Lopid but it wasn't in epic since the MD is in [place]. He is only on 1g of fish oil a day. Recommended to increase the dose to 4g but that'll not get anywhere close to his goal Trig. He'll probably need a statins. Prob need to f/u with either his primary care or endo to manage his hyperlipidemia. Otherwise, has been very compliance with his ART.   Recommendations:  Cont Triumeq 1 PO qday F/u with PCP or endo to manage HLD  Wilfred Lacy, PharmD Clinical Infectious [facility] 10/20/2015, 10:25 AM

## 2015-10-20 NOTE — Assessment & Plan Note (Signed)
He is doing well aside from blip.  Wants to recheck today.  He is not sexually active. Will see him back in 6 months if back to <20. Hep A/B immune.  He can get VZV vax as his CD4 is > 500.

## 2015-10-21 LAB — HIV-1 RNA QUANT-NO REFLEX-BLD

## 2015-10-22 ENCOUNTER — Telehealth: Payer: Self-pay | Admitting: Infectious Diseases

## 2015-10-22 NOTE — Telephone Encounter (Signed)
Left VM that pt is now undetectable, rtc in 6 months

## 2015-10-28 ENCOUNTER — Telehealth: Payer: Self-pay | Admitting: Radiation Oncology

## 2015-10-28 NOTE — Telephone Encounter (Signed)
Left message about canceling appointment on the 6th of April and moving it up earlier in the morning or to Friday, waiting for call back

## 2015-11-04 ENCOUNTER — Ambulatory Visit: Payer: 59 | Admitting: Radiation Oncology

## 2015-11-05 ENCOUNTER — Ambulatory Visit
Admission: RE | Admit: 2015-11-05 | Discharge: 2015-11-05 | Disposition: A | Discharge status: Home / Self Care | Payer: 59 | Source: Ambulatory Visit | Attending: Radiation Oncology | Admitting: Radiation Oncology

## 2015-11-05 ENCOUNTER — Encounter: Payer: Self-pay | Admitting: Radiation Oncology

## 2015-11-05 VITALS — BP 126/83 | HR 69 | Resp 18 | Wt 267.7 lb

## 2015-11-05 DIAGNOSIS — C8121 Mixed cellularity classical Hodgkin lymphoma, lymph nodes of head, face, and neck: Secondary | ICD-10-CM

## 2015-11-05 DIAGNOSIS — Z923 Personal history of irradiation: Secondary | ICD-10-CM

## 2015-11-05 NOTE — Progress Notes (Signed)
Radiation Oncology         [phone]) [phone] ________________________________  Name: Jon Shannon MRN: [id number]  Date: 11/05/2015  DOB: 12/21/1965  Follow-Up Visit Note  CC: [name], MD  [name], MD  Diagnosis:   50 year old gentleman with stage IIA mixed cellularity Hodgkin's disease presenting in the neck s/p consolidative Radiotherapy following 4 x ABVD - 04/03/2012-04/29/2012 to 30.6 Gy in 18 fraction in the form of involved site radiotherapy (ISRT) using IMRT with TomoTherapy     Interval Since Last Radiation:  42 months  Narrative:  The patient returns today for routine follow-up. Weight and vitals stable. Denies pain. Denies any pain or difficulty associated with swallowing, eating, or drinking. Reports mild dry mouth. Right eye remains red. Learned today that in three months he may lose his job of 17 years. Reports fatigue associated with stress and work. Reports persistent headaches. Denies nausea or vomiting.                                ALLERGIES:  is allergic to shrimp.  Meds: Current Outpatient Prescriptions  Medication Sig Dispense Refill  . Abacavir-Dolutegravir-Lamivud (TRIUMEQ) 600-50-300 MG TABS Take 1 tablet by mouth daily with breakfast. 90 tablet 3  . ACCU-CHEK AVIVA PLUS test strip     . aspirin 81 MG tablet Take 81 mg by mouth daily.    . B Complex-C (B-COMPLEX WITH VITAMIN C) tablet Take 1 tablet by mouth daily.    . bisoprolol-hydrochlorothiazide (ZIAC) 5-6.25 MG tablet Take 1 tablet by mouth daily.    . Chromium Picolinate (CHROMIUM PICOLATE PO) Take 1 tablet by mouth daily.    Marland Kitchen CINNAMON PO Take 1 capsule by mouth daily.    . diphenhydrAMINE (BENADRYL) 25 MG tablet Take 25 mg by mouth at bedtime as needed. For sleep    . escitalopram (LEXAPRO) 10 MG tablet Take 10 mg by mouth daily.      . famotidine (PEPCID AC) 10 MG chewable tablet Chew 10 mg by mouth 2 (two) times daily.    . fenofibrate 160 MG tablet Take 160 mg by mouth daily.    . fish  oil-omega-3 fatty acids 1000 MG capsule Take 2 g by mouth daily.     Marland Kitchen GARLIC OIL PO Take 1 tablet by mouth daily.    . INVOKAMET [phone] MG TABS     . loratadine (CLARITIN) 10 MG tablet Take 10 mg by mouth daily.      . Probiotic Product (PROBIOTIC DAILY PO) Take by mouth daily.    . vitamin C (ASCORBIC ACID) 500 MG tablet Take 1,000 mg by mouth daily.    . Cholecalciferol (VITAMIN D3) 1000 UNITS tablet Take 1,000 Units by mouth daily. Reported on 11/05/2015    . DHEA 10 MG CAPS Take by mouth. Reported on 11/05/2015     No current facility-administered medications for this encounter.    Physical Findings: The patient is in no acute distress. Patient is alert and oriented.  weight is 267 lb 11.2 oz (121.428 kg). His blood pressure is 126/83 and his pulse is 69. His respiration is 18 and oxygen saturation is 100%. . The oral cavity and oropharynx are symmetric with moist mucosa. The neck and supraclavicular region are free of any appreciable lymphadenopathy.  No significant changes.    Lab Findings: Lab Results  Component Value Date   WBC 5.9 10/06/2015   HGB 14.7 10/06/2015  HCT 42.6 10/06/2015   MCV 88.9 10/06/2015   PLT 242 10/06/2015   Lab Results  Component Value Date   TSH 2.638 05/29/2013    Impression:  The patient is NED.  He has some xerostomia, no other post-radiation adverse effects. His last TSH check was on 05/29/2013.   Plan:  Follow-up in 1 year. We will schedule the patient for TSH check.   _____________________________________  Sheral Apley. Tammi Klippel, M.D.  This document serves as a record of services personally performed by Tyler Pita, MD. It was created on his behalf by Arlyce Harman, a trained medical scribe. The creation of this record is based on the scribe's personal observations and the provider's statements to them. This document has been checked and approved by the attending provider.

## 2015-11-05 NOTE — Progress Notes (Signed)
Weight and vitals stable. Denies pain. Denies any pain or difficulty associated with eating or drinking. Reports mild dry mouth. Right eye remains red. Learned today that in three months he may loss his job of 17 years. Reports persistent headaches. Denies nausea or vomiting.   BP 126/83 mmHg  Pulse 69  Resp 18  Wt 267 lb 11.2 oz (121.428 kg)  SpO2 100% Wt Readings from Last 3 Encounters:  11/05/15 267 lb 11.2 oz (121.428 kg)  10/20/15 268 lb (121.564 kg)  06/21/15 276 lb 12.8 oz (125.556 kg)

## 2015-11-10 ENCOUNTER — Ambulatory Visit
Admission: RE | Admit: 2015-11-10 | Discharge: 2015-11-10 | Disposition: A | Discharge status: Home / Self Care | Payer: 59 | Source: Ambulatory Visit | Attending: Radiation Oncology | Admitting: Radiation Oncology

## 2015-11-10 DIAGNOSIS — C8190 Hodgkin lymphoma, unspecified, unspecified site: Secondary | ICD-10-CM | POA: Insufficient documentation

## 2015-11-10 DIAGNOSIS — C8121 Mixed cellularity classical Hodgkin lymphoma, lymph nodes of head, face, and neck: Secondary | ICD-10-CM

## 2015-11-10 DIAGNOSIS — Z923 Personal history of irradiation: Secondary | ICD-10-CM

## 2015-11-10 LAB — TSH: TSH: 2.011 m[IU]/L (ref 0.320–4.118)

## 2015-11-11 NOTE — Progress Notes (Signed)
Quick Note:  Please call patient with normal result.  Thanks. MM ______ 

## 2015-11-16 ENCOUNTER — Telehealth: Payer: Self-pay | Admitting: Radiation Oncology

## 2015-11-16 NOTE — Telephone Encounter (Signed)
Phoned patient per Dr. Johny Shears order informing him that his TSH is normal at 2.011. Patient verbalized understanding and expressed appreciation for the call.

## 2015-11-22 ENCOUNTER — Ambulatory Visit: Payer: 59 | Admitting: Dietician

## 2015-12-01 ENCOUNTER — Encounter: Payer: Self-pay | Admitting: Infectious Diseases

## 2015-12-14 ENCOUNTER — Other Ambulatory Visit (HOSPITAL_BASED_OUTPATIENT_CLINIC_OR_DEPARTMENT_OTHER): Payer: 59

## 2015-12-14 ENCOUNTER — Other Ambulatory Visit: Payer: Self-pay | Admitting: *Deleted

## 2015-12-14 ENCOUNTER — Telehealth: Payer: Self-pay | Admitting: Hematology and Oncology

## 2015-12-14 ENCOUNTER — Ambulatory Visit (HOSPITAL_BASED_OUTPATIENT_CLINIC_OR_DEPARTMENT_OTHER): Payer: 59 | Admitting: Hematology and Oncology

## 2015-12-14 ENCOUNTER — Encounter: Payer: Self-pay | Admitting: Hematology and Oncology

## 2015-12-14 VITALS — BP 116/64 | HR 73 | Temp 97.9°F | Resp 18 | Ht 72.0 in | Wt 269.2 lb

## 2015-12-14 DIAGNOSIS — Z8571 Personal history of Hodgkin lymphoma: Secondary | ICD-10-CM | POA: Diagnosis not present

## 2015-12-14 DIAGNOSIS — C8121 Mixed cellularity classical Hodgkin lymphoma, lymph nodes of head, face, and neck: Secondary | ICD-10-CM

## 2015-12-14 DIAGNOSIS — B20 Human immunodeficiency virus [HIV] disease: Secondary | ICD-10-CM

## 2015-12-14 DIAGNOSIS — Z515 Encounter for palliative care: Secondary | ICD-10-CM

## 2015-12-14 DIAGNOSIS — F341 Dysthymic disorder: Secondary | ICD-10-CM | POA: Diagnosis not present

## 2015-12-14 LAB — CBC WITH DIFFERENTIAL/PLATELET
BASO%: 0.2 % (ref 0.0–2.0)
Basophils Absolute: 0 10*3/uL (ref 0.0–0.1)
EOS%: 1.4 % (ref 0.0–7.0)
Eosinophils Absolute: 0.1 10*3/uL (ref 0.0–0.5)
HEMATOCRIT: 46.1 % (ref 38.4–49.9)
HGB: 14.9 g/dL (ref 13.0–17.1)
LYMPH#: 2.6 10*3/uL (ref 0.9–3.3)
LYMPH%: 37.8 % (ref 14.0–49.0)
MCH: 29.3 pg (ref 27.2–33.4)
MCHC: 32.4 g/dL (ref 32.0–36.0)
MCV: 90.3 fL (ref 79.3–98.0)
MONO#: 0.5 10*3/uL (ref 0.1–0.9)
MONO%: 6.8 % (ref 0.0–14.0)
NEUT%: 53.8 % (ref 39.0–75.0)
NEUTROS ABS: 3.7 10*3/uL (ref 1.5–6.5)
Platelets: 241 10*3/uL (ref 140–400)
RBC: 5.1 10*6/uL (ref 4.20–5.82)
RDW: 13.2 % (ref 11.0–14.6)
WBC: 6.9 10*3/uL (ref 4.0–10.3)

## 2015-12-14 LAB — COMPREHENSIVE METABOLIC PANEL
ALT: 30 U/L (ref 0–55)
AST: 24 U/L (ref 5–34)
Albumin: 4.3 g/dL (ref 3.5–5.0)
Alkaline Phosphatase: 45 U/L (ref 40–150)
Anion Gap: 9 mEq/L (ref 3–11)
BUN: 23.7 mg/dL (ref 7.0–26.0)
CHLORIDE: 104 meq/L (ref 98–109)
CO2: 27 meq/L (ref 22–29)
CREATININE: 1.1 mg/dL (ref 0.7–1.3)
Calcium: 9.9 mg/dL (ref 8.4–10.4)
EGFR: 78 mL/min/{1.73_m2} — ABNORMAL LOW (ref >90)
Glucose: 100 mg/dl (ref 70–140)
Potassium: 3.5 mEq/L (ref 3.5–5.1)
Sodium: 140 mEq/L (ref 136–145)
TOTAL PROTEIN: 7.5 g/dL (ref 6.4–8.3)
Total Bilirubin: 0.3 mg/dL (ref 0.20–1.20)

## 2015-12-14 LAB — LACTATE DEHYDROGENASE: LDH: 150 U/L (ref 125–245)

## 2015-12-14 NOTE — Assessment & Plan Note (Signed)
He is morbidly obese. I recommend dietary modification and weight loss. He is very motivated and has made significant dietary modification. I congratulated his effort and reinforced the importance of weight loss.

## 2015-12-14 NOTE — Assessment & Plan Note (Signed)
We discussed increase physical activity The patient is exercising on a regular basis and has a gym membership I gave him additional resources from the [facility]. We discussed importance of vitamin D supplementation.

## 2015-12-14 NOTE — Assessment & Plan Note (Signed)
He is receiving anti-retroviral treatment. CBC is within normal limits. He will continue retroviral treatment under the guidance of his infectious disease physician. 

## 2015-12-14 NOTE — Assessment & Plan Note (Signed)
The patient shared with me some of his concerns regarding the health of multiple other family members. He admits that he has not taken his personal health as priority over others. He is battling with chronic anxiety & depression due to concerns that he fears one day he may die prematurely from his HIV infection. He is currently seeing a counselor and is on medication. I provided emotional support. While HIV infection is considered not curable at this time, his disease appears to be well treated and under control.

## 2015-12-14 NOTE — Telephone Encounter (Signed)
Gave pt avs °

## 2015-12-14 NOTE — Assessment & Plan Note (Signed)
Clinically, he has no signs of recurrence. He is almost a long-term cancer survivor. I will transition his care to cancer survivorship clinic next year

## 2015-12-14 NOTE — Progress Notes (Signed)
Jon Shannon OFFICE PROGRESS NOTE  Patient Care Team: [name], MD as PCP - General (Family Medicine) [name], MD as PCP - Infectious Diseases (Infectious Diseases) [name], MD (Otolaryngology) [name], MD as Referring Physician (Family Medicine)  SUMMARY OF ONCOLOGIC HISTORY:  Thispatient was diagnosed with classical Hodgkin lymphoma, stage IIA last year after presenting initially with palpable lymphadenopathy in his neck. The patient was treated with several cycles of ABVD from April 2013 to August 2013. Subsequently he underwent consolidation radiation treatment. His PET/CT scan from November 2014 showed no evidence of disease recurrence.  INTERVAL HISTORY: Please see below for problem oriented charting. He feels well. Denies new lymphadenopathy. He is compliant with all his follow-up to see other physicians. He is exercising on a regular basis and has done significant dietary modification. Recently, he shared with me his frustration related to the health of his mother. He had to take his mother to the emergency department at [facility] ED this past weekend due to concerns for mini strokes. His mother was subsequently discharged home with significant weakness and is undergoing some rehabilitation. As a consequence of his concern for his mother, he has delayed in taking his medications. He is concerned about his personal health in many ways.  REVIEW OF SYSTEMS:   Constitutional: Denies fevers, chills or abnormal weight loss Eyes: Denies blurriness of vision Ears, nose, mouth, throat, and face: Denies mucositis or sore throat Respiratory: Denies cough, dyspnea or wheezes Cardiovascular: Denies palpitation, chest discomfort or lower extremity swelling Gastrointestinal:  Denies nausea, heartburn or change in bowel habits Skin: Denies abnormal skin rashes Lymphatics: Denies new lymphadenopathy or easy bruising Neurological:Denies numbness, tingling  or new weaknesses Behavioral/Psych: Mood is stable, no new changes  All other systems were reviewed with the patient and are negative.  I have reviewed the past medical history, past surgical history, social history and family history with the patient and they are unchanged from previous note.  ALLERGIES:  is allergic to shrimp.  MEDICATIONS:  Current Outpatient Prescriptions  Medication Sig Dispense Refill  . Abacavir-Dolutegravir-Lamivud (TRIUMEQ) 600-50-300 MG TABS Take 1 tablet by mouth daily with breakfast. 90 tablet 3  . ACCU-CHEK AVIVA PLUS test strip     . aspirin 81 MG tablet Take 81 mg by mouth daily.    . B Complex-C (B-COMPLEX WITH VITAMIN C) tablet Take 1 tablet by mouth daily.    . bisoprolol-hydrochlorothiazide (ZIAC) 5-6.25 MG tablet Take 1 tablet by mouth daily.    . Cholecalciferol (VITAMIN D3) 1000 UNITS tablet Take 1,000 Units by mouth daily. Reported on 11/05/2015    . Chromium Picolinate (CHROMIUM PICOLATE PO) Take 1 tablet by mouth daily.    Marland Kitchen CINNAMON PO Take 1 capsule by mouth daily.    Marland Kitchen DHEA 10 MG CAPS Take by mouth. Reported on 11/05/2015    . diphenhydrAMINE (BENADRYL) 25 MG tablet Take 25 mg by mouth at bedtime as needed. For sleep    . escitalopram (LEXAPRO) 10 MG tablet Take 10 mg by mouth daily.      . famotidine (PEPCID AC) 10 MG chewable tablet Chew 10 mg by mouth 2 (two) times daily.    . fenofibrate 160 MG tablet Take 160 mg by mouth daily.    . fish oil-omega-3 fatty acids 1000 MG capsule Take 2 g by mouth daily.     Marland Kitchen GARLIC OIL PO Take 1 tablet by mouth daily.    . INVOKAMET [phone] MG TABS     .  loratadine (CLARITIN) 10 MG tablet Take 10 mg by mouth daily.      . Probiotic Product (PROBIOTIC DAILY PO) Take by mouth daily.    . vitamin C (ASCORBIC ACID) 500 MG tablet Take 1,000 mg by mouth daily.     No current facility-administered medications for this visit.    PHYSICAL EXAMINATION: ECOG PERFORMANCE STATUS: 0 - Asymptomatic  Filed Vitals:    12/14/15 1441  BP: 116/64  Pulse: 73  Temp: 97.9 F (36.6 C)  Resp: 18   Filed Weights   12/14/15 1441  Weight: 269 lb 3.2 oz (122.108 kg)    GENERAL:alert, no distress and comfortable. He is obese SKIN: skin color, texture, turgor are normal, no rashes or significant lesions EYES: normal, Conjunctiva are pink and non-injected, sclera clear OROPHARYNX:no exudate, no erythema and lips, buccal mucosa, and tongue normal  NECK: supple, thyroid normal size, non-tender, without nodularity LYMPH:  no palpable lymphadenopathy in the cervical, axillary or inguinal LUNGS: clear to auscultation and percussion with normal breathing effort HEART: regular rate & rhythm and no murmurs and no lower extremity edema ABDOMEN:abdomen soft, non-tender and normal bowel sounds Musculoskeletal:no cyanosis of digits and no clubbing  NEURO: alert & oriented x 3 with fluent speech, no focal motor/sensory deficits  LABORATORY DATA:  I have reviewed the data as listed    Component Value Date/Time   NA 140 12/14/2015 1448   NA 136 10/06/2015 1710   K 3.5 12/14/2015 1448   K 3.8 10/06/2015 1710   CL 98 10/06/2015 1710   CL 100 09/16/2012 1514   CO2 27 12/14/2015 1448   CO2 25 10/06/2015 1710   GLUCOSE 100 12/14/2015 1448   GLUCOSE 126* 10/06/2015 1710   GLUCOSE 116* 09/16/2012 1514   BUN 23.7 12/14/2015 1448   BUN 23 10/06/2015 1710   CREATININE 1.1 12/14/2015 1448   CREATININE 1.15 10/06/2015 1710   CREATININE 0.90 03/18/2012 0937   CALCIUM 9.9 12/14/2015 1448   CALCIUM 9.6 10/06/2015 1710   PROT 7.5 12/14/2015 1448   PROT 7.3 10/06/2015 1710   ALBUMIN 4.3 12/14/2015 1448   ALBUMIN 4.7 10/06/2015 1710   AST 24 12/14/2015 1448   AST 24 10/06/2015 1710   ALT 30 12/14/2015 1448   ALT 31 10/06/2015 1710   ALKPHOS 45 12/14/2015 1448   ALKPHOS 55 10/06/2015 1710   BILITOT 0.30 12/14/2015 1448   BILITOT 0.4 10/06/2015 1710   GFRNONAA 74 10/06/2015 1710   GFRAA 85 10/06/2015 1710    No  results found for: SPEP, UPEP  Lab Results  Component Value Date   WBC 6.9 12/14/2015   NEUTROABS 3.7 12/14/2015   HGB 14.9 12/14/2015   HCT 46.1 12/14/2015   MCV 90.3 12/14/2015   PLT 241 12/14/2015      Chemistry      Component Value Date/Time   NA 140 12/14/2015 1448   NA 136 10/06/2015 1710   K 3.5 12/14/2015 1448   K 3.8 10/06/2015 1710   CL 98 10/06/2015 1710   CL 100 09/16/2012 1514   CO2 27 12/14/2015 1448   CO2 25 10/06/2015 1710   BUN 23.7 12/14/2015 1448   BUN 23 10/06/2015 1710   CREATININE 1.1 12/14/2015 1448   CREATININE 1.15 10/06/2015 1710   CREATININE 0.90 03/18/2012 0937   GLU 286* 11/24/2011 1446      Component Value Date/Time   CALCIUM 9.9 12/14/2015 1448   CALCIUM 9.6 10/06/2015 1710   ALKPHOS 45 12/14/2015 1448  ALKPHOS 55 10/06/2015 1710   AST 24 12/14/2015 1448   AST 24 10/06/2015 1710   ALT 30 12/14/2015 1448   ALT 31 10/06/2015 1710   BILITOT 0.30 12/14/2015 1448   BILITOT 0.4 10/06/2015 1710      ASSESSMENT & PLAN:  History of hodgkin's lymphoma Clinically, he has no signs of recurrence. He is almost a long-term cancer survivor. I will transition his care to cancer survivorship clinic next year  Human immunodeficiency virus (HIV) disease He is receiving anti-retroviral treatment. CBC is within normal limits. He will continue retroviral treatment under the guidance of his infectious disease physician.  Morbid obesity He is morbidly obese. I recommend dietary modification and weight loss. He is very motivated and has made significant dietary modification. I congratulated his effort and reinforced the importance of weight loss.  Quality of life palliative care encounter We discussed increase physical activity The patient is exercising on a regular basis and has a gym membership I gave him additional resources from the [place]. We discussed importance of vitamin D supplementation.  ANXIETY DEPRESSION The patient  shared with me some of his concerns regarding the health of multiple other family members. He admits that he has not taken his personal health as priority over others. He is battling with chronic anxiety & depression due to concerns that he fears one day he may die prematurely from his HIV infection. He is currently seeing a counselor and is on medication. I provided emotional support. While HIV infection is considered not curable at this time, his disease appears to be well treated and under control.   Orders Placed This Encounter  Procedures  . CBC with Differential/Platelet    Standing Status: Future     Number of Occurrences:      Standing Expiration Date: 01/17/2017  . Comprehensive metabolic panel    Standing Status: Future     Number of Occurrences:      Standing Expiration Date: 01/17/2017  . Amb Referral to Survivorship Long term    Referral Priority:  Routine    Referral Type:  Consultation    Referred to Provider:  Holley Bouche, NP    Number of Visits Requested:  1   All questions were answered. The patient knows to call the clinic with any problems, questions or concerns. No barriers to learning was detected. I spent 20 minutes counseling the patient face to face. The total time spent in the appointment was 30 minutes and more than 50% was on counseling and review of test results     [facility], [place], MD 12/14/2015 3:56 PM

## 2016-02-22 ENCOUNTER — Encounter: Payer: Self-pay | Admitting: Infectious Diseases

## 2016-03-06 ENCOUNTER — Other Ambulatory Visit: Payer: 59

## 2016-03-06 DIAGNOSIS — B20 Human immunodeficiency virus [HIV] disease: Secondary | ICD-10-CM

## 2016-03-06 DIAGNOSIS — E781 Pure hyperglyceridemia: Secondary | ICD-10-CM

## 2016-03-07 LAB — COMPREHENSIVE METABOLIC PANEL
ALK PHOS: 40 U/L (ref 40–115)
ALT: 25 U/L (ref 9–46)
AST: 24 U/L (ref 10–35)
Albumin: 4.5 g/dL (ref 3.6–5.1)
BUN: 22 mg/dL (ref 7–25)
CO2: 27 mmol/L (ref 20–31)
CREATININE: 1.27 mg/dL (ref 0.70–1.33)
Calcium: 9.3 mg/dL (ref 8.6–10.3)
Chloride: 100 mmol/L (ref 98–110)
GLUCOSE: 87 mg/dL (ref 65–99)
POTASSIUM: 3.9 mmol/L (ref 3.5–5.3)
SODIUM: 140 mmol/L (ref 135–146)
TOTAL PROTEIN: 6.9 g/dL (ref 6.1–8.1)
Total Bilirubin: 0.3 mg/dL (ref 0.2–1.2)

## 2016-03-07 LAB — LIPID PANEL
Cholesterol: 168 mg/dL (ref 125–200)
HDL: 25 mg/dL — ABNORMAL LOW (ref >=40)
Total CHOL/HDL Ratio: 6.7 Ratio — ABNORMAL HIGH (ref <=5.0)
Triglycerides: 765 mg/dL — ABNORMAL HIGH (ref <150)

## 2016-03-07 LAB — HIV-1 RNA QUANT-NO REFLEX-BLD

## 2016-03-07 LAB — T-HELPER CELL (CD4) - (RCID CLINIC ONLY)
CD4 T CELL HELPER: 27 % — AB (ref 33–55)
CD4 T Cell Abs: 840 /uL (ref 400–2700)

## 2016-03-07 LAB — CBC
HCT: 42.3 % (ref 38.5–50.0)
Hemoglobin: 14.7 g/dL (ref 13.2–17.1)
MCH: 29.9 pg (ref 27.0–33.0)
MCHC: 34.8 g/dL (ref 32.0–36.0)
MCV: 86.2 fL (ref 80.0–100.0)
MPV: 9.1 fL (ref 7.5–12.5)
PLATELETS: 252 10*3/uL (ref 140–400)
RBC: 4.91 MIL/uL (ref 4.20–5.80)
RDW: 14 % (ref 11.0–15.0)
WBC: 6.7 10*3/uL (ref 3.8–10.8)

## 2016-03-15 ENCOUNTER — Encounter: Payer: Self-pay | Admitting: Infectious Diseases

## 2016-03-20 ENCOUNTER — Ambulatory Visit (INDEPENDENT_AMBULATORY_CARE_PROVIDER_SITE_OTHER): Payer: 59 | Admitting: Infectious Diseases

## 2016-03-20 ENCOUNTER — Encounter: Payer: Self-pay | Admitting: Infectious Diseases

## 2016-03-20 VITALS — BP 145/76 | HR 76 | Temp 97.2°F | Wt 268.0 lb

## 2016-03-20 DIAGNOSIS — E785 Hyperlipidemia, unspecified: Secondary | ICD-10-CM

## 2016-03-20 DIAGNOSIS — B20 Human immunodeficiency virus [HIV] disease: Secondary | ICD-10-CM

## 2016-03-20 DIAGNOSIS — Z8571 Personal history of Hodgkin lymphoma: Secondary | ICD-10-CM | POA: Diagnosis not present

## 2016-03-20 DIAGNOSIS — Z23 Encounter for immunization: Secondary | ICD-10-CM | POA: Diagnosis not present

## 2016-03-20 DIAGNOSIS — Z113 Encounter for screening for infections with a predominantly sexual mode of transmission: Secondary | ICD-10-CM

## 2016-03-20 DIAGNOSIS — F4310 Post-traumatic stress disorder, unspecified: Secondary | ICD-10-CM

## 2016-03-20 DIAGNOSIS — E11621 Type 2 diabetes mellitus with foot ulcer: Secondary | ICD-10-CM

## 2016-03-20 DIAGNOSIS — Z79899 Other long term (current) drug therapy: Secondary | ICD-10-CM

## 2016-03-20 DIAGNOSIS — L97509 Non-pressure chronic ulcer of other part of unspecified foot with unspecified severity: Secondary | ICD-10-CM

## 2016-03-20 NOTE — Assessment & Plan Note (Signed)
Doing very well, encourage wt loss.  F/u with PCP

## 2016-03-20 NOTE — Assessment & Plan Note (Addendum)
He is doing well.  Will continue his current meds.  Will see him back in 6 months.  Offered/refused condoms.  He has new partner, also positive.  Gets flu shot today.

## 2016-03-20 NOTE — Assessment & Plan Note (Signed)
Has f/u with counselor in [place]. Has been going well.

## 2016-03-20 NOTE — Assessment & Plan Note (Signed)
Is going to long term survivors group, d/c by onc.

## 2016-03-20 NOTE — Assessment & Plan Note (Signed)
Will try 1 glass of wine per day.  His Trig are better, as is his Chol.

## 2016-03-20 NOTE — Progress Notes (Signed)
   Subjective:    Patient ID: Jon Shannon, male    DOB: 07-19-1966, 50 y.o.   MRN: [id number]  HPI 50 yo M with HIV+, previous stage IIa Hodgkin's lymphoma. He had onc f/u May 2017, clear. Has DM as well from steroids. Severe hypertriglyceridemia. Has PCP in [place].   Recently had fenofibrate added to his meds for his lipids. This has been giving him PM GI upset, with eating. Loose BM.  Taking 4g fish oil now. Eating more fish now as well.  Wants to know if red wine would improve his cholesterol.  He is on triumeq.   HIV 1 RNA Quant (copies/mL)  Date Value  03/06/2016 <20  10/20/2015 <20  10/06/2015 41 (H)   CD4 T Cell Abs (/uL)  Date Value  03/06/2016 840  10/06/2015 1,010  05/05/2015 900   No problems with ART.  Having anxiety from work. Fredrich Birks is hassling him about his work schedule. 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  FSG have been 107-147.   Has had podiatry f/u.  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   Review of Systems  Constitutional: Negative for appetite change and unexpected weight change.  Respiratory: Negative for cough, choking and shortness of breath.   Cardiovascular: Positive for chest pain.  Gastrointestinal: Negative for constipation and diarrhea.  Genitourinary: Negative for difficulty urinating.  Neurological: Positive for numbness.  has occas CP- believes it is indigestion. Not weekly. Not when exerting himself.  Has not had ophhto this year.     Objective:   Physical Exam  Constitutional: He appears well-developed and well-nourished.  HENT:  Mouth/Throat: No oropharyngeal exudate.  Eyes: EOM are normal. Pupils are equal, round, and reactive to light.  Neck: Neck supple.  Cardiovascular: Normal rate, regular rhythm and normal heart sounds.   Pulmonary/Chest: Effort normal and breath sounds normal.  Abdominal: Soft. Bowel sounds are normal. There is no tenderness. There is no rebound.  Musculoskeletal: He  exhibits no edema.  Lymphadenopathy:    He has no cervical adenopathy.      Assessment & Plan:

## 2016-05-31 ENCOUNTER — Other Ambulatory Visit: Payer: Self-pay | Admitting: *Deleted

## 2016-05-31 DIAGNOSIS — C8190 Hodgkin lymphoma, unspecified, unspecified site: Secondary | ICD-10-CM

## 2016-05-31 MED ORDER — ABACAVIR-DOLUTEGRAVIR-LAMIVUD 600-50-300 MG PO TABS: 1.0000 | ORAL_TABLET | Freq: Every day | ORAL | 3 refills | Status: DC

## 2016-08-21 ENCOUNTER — Other Ambulatory Visit: Payer: Self-pay | Admitting: Adult Health

## 2016-08-21 ENCOUNTER — Telehealth: Payer: Self-pay | Admitting: Hematology and Oncology

## 2016-08-21 DIAGNOSIS — Z8571 Personal history of Hodgkin lymphoma: Secondary | ICD-10-CM

## 2016-08-21 NOTE — Telephone Encounter (Signed)
Spoke with patient re May appointments. Due to patient driving called back and left detailed message.

## 2016-08-28 ENCOUNTER — Ambulatory Visit: Payer: 59

## 2016-08-29 ENCOUNTER — Ambulatory Visit: Payer: Self-pay

## 2016-09-06 ENCOUNTER — Other Ambulatory Visit: Payer: BLUE CROSS/BLUE SHIELD

## 2016-09-06 DIAGNOSIS — B20 Human immunodeficiency virus [HIV] disease: Secondary | ICD-10-CM | POA: Diagnosis not present

## 2016-09-06 DIAGNOSIS — E785 Hyperlipidemia, unspecified: Secondary | ICD-10-CM | POA: Diagnosis not present

## 2016-09-06 DIAGNOSIS — Z113 Encounter for screening for infections with a predominantly sexual mode of transmission: Secondary | ICD-10-CM | POA: Diagnosis not present

## 2016-09-06 DIAGNOSIS — Z79899 Other long term (current) drug therapy: Secondary | ICD-10-CM | POA: Diagnosis not present

## 2016-09-06 LAB — COMPREHENSIVE METABOLIC PANEL
ALT: 48 U/L — ABNORMAL HIGH (ref 9–46)
AST: 34 U/L (ref 10–35)
Albumin: 4.8 g/dL (ref 3.6–5.1)
Alkaline Phosphatase: 36 U/L — ABNORMAL LOW (ref 40–115)
BUN: 17 mg/dL (ref 7–25)
CALCIUM: 10.1 mg/dL (ref 8.6–10.3)
CHLORIDE: 103 mmol/L (ref 98–110)
CO2: 25 mmol/L (ref 20–31)
Creat: 0.99 mg/dL (ref 0.70–1.33)
Glucose, Bld: 102 mg/dL — ABNORMAL HIGH (ref 65–99)
Potassium: 4.2 mmol/L (ref 3.5–5.3)
Sodium: 139 mmol/L (ref 135–146)
TOTAL PROTEIN: 7.5 g/dL (ref 6.1–8.1)
Total Bilirubin: 0.4 mg/dL (ref 0.2–1.2)

## 2016-09-06 LAB — LIPID PANEL
CHOLESTEROL: 145 mg/dL (ref <200)
HDL: 24 mg/dL — AB (ref >40)
TRIGLYCERIDES: 520 mg/dL — AB (ref <150)
Total CHOL/HDL Ratio: 6 Ratio — ABNORMAL HIGH (ref <5.0)

## 2016-09-06 LAB — CBC
HEMATOCRIT: 46.2 % (ref 38.5–50.0)
Hemoglobin: 15.8 g/dL (ref 13.2–17.1)
MCH: 30.5 pg (ref 27.0–33.0)
MCHC: 34.2 g/dL (ref 32.0–36.0)
MCV: 89.2 fL (ref 80.0–100.0)
MPV: 9 fL (ref 7.5–12.5)
Platelets: 229 10*3/uL (ref 140–400)
RBC: 5.18 MIL/uL (ref 4.20–5.80)
RDW: 13.7 % (ref 11.0–15.0)
WBC: 6.6 10*3/uL (ref 3.8–10.8)

## 2016-09-07 LAB — RPR

## 2016-09-07 LAB — T-HELPER CELL (CD4) - (RCID CLINIC ONLY)
CD4 % Helper T Cell: 25 % — ABNORMAL LOW (ref 33–55)
CD4 T Cell Abs: 820 /uL (ref 400–2700)

## 2016-09-13 LAB — HIV-1 RNA QUANT-NO REFLEX-BLD
HIV 1 RNA Quant: <20 copies/mL
HIV-1 RNA QUANT, LOG: NOT DETECTED {Log_copies}/mL

## 2016-09-19 ENCOUNTER — Encounter: Payer: Self-pay | Admitting: Infectious Diseases

## 2016-09-20 ENCOUNTER — Other Ambulatory Visit: Payer: Self-pay | Admitting: *Deleted

## 2016-09-20 ENCOUNTER — Ambulatory Visit (INDEPENDENT_AMBULATORY_CARE_PROVIDER_SITE_OTHER): Payer: BLUE CROSS/BLUE SHIELD | Admitting: Infectious Diseases

## 2016-09-20 ENCOUNTER — Encounter: Payer: Self-pay | Admitting: Infectious Diseases

## 2016-09-20 VITALS — BP 137/78 | HR 73 | Temp 98.6°F | Ht 70.0 in | Wt 276.0 lb

## 2016-09-20 DIAGNOSIS — G4700 Insomnia, unspecified: Secondary | ICD-10-CM | POA: Diagnosis not present

## 2016-09-20 DIAGNOSIS — L97509 Non-pressure chronic ulcer of other part of unspecified foot with unspecified severity: Secondary | ICD-10-CM

## 2016-09-20 DIAGNOSIS — B20 Human immunodeficiency virus [HIV] disease: Secondary | ICD-10-CM | POA: Diagnosis not present

## 2016-09-20 DIAGNOSIS — Z8571 Personal history of Hodgkin lymphoma: Secondary | ICD-10-CM | POA: Diagnosis not present

## 2016-09-20 DIAGNOSIS — Z23 Encounter for immunization: Secondary | ICD-10-CM

## 2016-09-20 DIAGNOSIS — I10 Essential (primary) hypertension: Secondary | ICD-10-CM | POA: Diagnosis not present

## 2016-09-20 DIAGNOSIS — Z113 Encounter for screening for infections with a predominantly sexual mode of transmission: Secondary | ICD-10-CM

## 2016-09-20 DIAGNOSIS — E11621 Type 2 diabetes mellitus with foot ulcer: Secondary | ICD-10-CM | POA: Diagnosis not present

## 2016-09-20 DIAGNOSIS — C8190 Hodgkin lymphoma, unspecified, unspecified site: Secondary | ICD-10-CM

## 2016-09-20 DIAGNOSIS — E781 Pure hyperglyceridemia: Secondary | ICD-10-CM

## 2016-09-20 MED ORDER — ABACAVIR-DOLUTEGRAVIR-LAMIVUD 600-50-300 MG PO TABS: 1.0000 | ORAL_TABLET | Freq: Every day | ORAL | 3 refills | Status: DC

## 2016-09-20 NOTE — Assessment & Plan Note (Signed)
He still is able to see his PCP in [place].  Will be available as needed.

## 2016-09-20 NOTE — Assessment & Plan Note (Signed)
He appears to be long term clear. He has f/u at cancer center.

## 2016-09-20 NOTE — Addendum Note (Signed)
Addended by: Roma Kayser on: 09/20/2016 12:01 PM   Modules accepted: Orders

## 2016-09-20 NOTE — Assessment & Plan Note (Signed)
Worked on sleep hygeine.  No daytime naps!

## 2016-09-20 NOTE — Assessment & Plan Note (Signed)
Well controlled today.  Continue to f/u with PCP

## 2016-09-20 NOTE — Progress Notes (Signed)
   Subjective:    Patient ID: Jon Shannon, male    DOB: 1966/06/12, 51 y.o.   MRN: [id number]  HPI 51 yo M with HIV+, previous stage IIa Hodgkin's lymphoma (2012). He had onc f/u May 2017, clear. Has DM as well from steroids. Severe hypertriglyceridemia. Has PCP in [place] Chancy Milroy, J).  Taking 4g fish oil, fenofibrate, and pravastatin.  Lost his job 07-13-16. He has had disability med-psych eval.  Has signed up for ADAP   HIV 1 RNA Quant (copies/mL)  Date Value  09/06/2016 <20 NOT DETECTED  03/06/2016 <20  10/20/2015 <20   CD4 T Cell Abs (/uL)  Date Value  09/06/2016 820  03/06/2016 840  10/06/2015 1,010   Lab Results  Component Value Date   CHOL 145 09/06/2016   HDL 24 (L) 09/06/2016   LDLCALC NOT CALC 09/06/2016   TRIG 520 (H) 09/06/2016   CHOLHDL 6.0 (H) 09/06/2016    Has seen ophtho this year.   Review of Systems  Constitutional: Negative for appetite change and unexpected weight change.  Eyes: Negative for visual disturbance.  Respiratory: Positive for shortness of breath.   Gastrointestinal: Negative for constipation and diarrhea.  Genitourinary: Negative for difficulty urinating.  Neurological: Positive for numbness. Negative for headaches.  Psychiatric/Behavioral: Positive for sleep disturbance.  DOE- can walk 3-4 flights of stairs. Can walk around [place].  Wt up 8#, has quit going to the gym until last week.  Stool occas loose Sleeps during the day.     Objective:   Physical Exam  Constitutional: He appears well-developed and well-nourished.  HENT:  Mouth/Throat: No oropharyngeal exudate.  Eyes: EOM are normal. Pupils are equal, round, and reactive to light.  Neck: Neck supple.  Cardiovascular: Normal rate, regular rhythm and normal heart sounds.   Pulses:      Dorsalis pedis pulses are 3+ on the right side, and 3+ on the left side.  Pulmonary/Chest: Effort normal and breath sounds normal.  Abdominal: Soft. Bowel sounds are normal. There is no  tenderness. There is no rebound.  Musculoskeletal: He exhibits no edema.       Feet:  Lymphadenopathy:    He has no cervical adenopathy.  Neurological: A sensory deficit is present.      Assessment & Plan:

## 2016-09-20 NOTE — Assessment & Plan Note (Signed)
Trig better, continue his statin, fish oil, fibrate.  Appreciate PCP f/u.

## 2016-09-20 NOTE — Assessment & Plan Note (Addendum)
He is doing well He has met with ADAP advisor.  Offered/refused condoms. Not sexually active.  Give mening vax today.  Update Tet vax.  Has gotten flu shot.  Has had colon rtc in 6 months

## 2016-09-23 ENCOUNTER — Encounter: Payer: Self-pay | Admitting: Infectious Diseases

## 2016-11-09 ENCOUNTER — Inpatient Hospital Stay
Admission: RE | Admit: 2016-11-09 | Discharge: 2016-11-09 | Disposition: A | Discharge status: Home / Self Care | Payer: Self-pay | Source: Ambulatory Visit | Attending: Radiation Oncology | Admitting: Radiation Oncology

## 2016-11-09 ENCOUNTER — Encounter: Payer: Self-pay | Admitting: Radiation Oncology

## 2016-11-09 ENCOUNTER — Ambulatory Visit
Admission: RE | Admit: 2016-11-09 | Discharge: 2016-11-09 | Disposition: A | Discharge status: Home / Self Care | Payer: BLUE CROSS/BLUE SHIELD | Source: Ambulatory Visit | Attending: Radiation Oncology | Admitting: Radiation Oncology

## 2016-11-09 VITALS — BP 138/85 | HR 73 | Resp 18 | Wt 281.6 lb

## 2016-11-09 DIAGNOSIS — Z923 Personal history of irradiation: Secondary | ICD-10-CM | POA: Diagnosis not present

## 2016-11-09 DIAGNOSIS — Z8571 Personal history of Hodgkin lymphoma: Secondary | ICD-10-CM | POA: Insufficient documentation

## 2016-11-09 DIAGNOSIS — Z08 Encounter for follow-up examination after completed treatment for malignant neoplasm: Secondary | ICD-10-CM | POA: Diagnosis not present

## 2016-11-09 NOTE — Progress Notes (Signed)
Weight and vitals stable. Denies pain. Denies any pain or difficulty associated with eating or drinking. Reports mild dry mouth. Reports persistent headaches. Denies nausea or vomiting. Reports diarrhea related to side effects of pravastatin. Reports managing diarrhea with Imodium. Reports "chemo brain." reports neuropathy of hands and feet. Explain he ultimately lost his job in December and has been without work. Reports he is now taking care of his mother who has cancer.  BP 138/85 (BP Location: Left Arm, Patient Position: Sitting, Cuff Size: Large)   Pulse 73   Resp 18   Wt 281 lb 9.6 oz (127.7 kg)   SpO2 100%   BMI 40.41 kg/m  Wt Readings from Last 3 Encounters:  11/09/16 281 lb 9.6 oz (127.7 kg)  09/20/16 276 lb (125.2 kg)  03/20/16 268 lb (121.6 kg)

## 2016-11-09 NOTE — Progress Notes (Signed)
Radiation Oncology         [phone]) [phone] ________________________________  Name: Jon Shannon MRN: [id number]  Date: 11/09/2016  DOB: [birth year]  Follow-Up Visit Note  CC: [name], MD  [name], MD  Diagnosis:   51 year old gentleman with stage IIA mixed cellularity Hodgkin's disease presenting in the neck s/p consolidative Radiotherapy following 4 x ABVD - 04/03/2012-04/29/2012 to 30.6 Gy in 18 fraction in the form of involved site radiotherapy (ISRT) using IMRT with TomoTherapy     Interval Since Last Radiation:  54 months  Narrative:  The patient returns today for routine follow-up.   On review of systems, the patient reports that he is doing well overall. Weight and vitals stable. He denies any pain or difficulty associated with eating or drinking. He reports mild dry mouth. His right eye remains red. He reports persistent headaches. He reports diarrhea related to side effects of pravastatin. Reports managing diarrhea with Imodium. He reports "chemo brain". He reports neuropathy of the hands and feet. He denies any new problems with his neck or mouth. He denies any chest pain, shortness of breath, cough, night sweats, fevers, chills, night sweats, unintended weight changes. He denies any bladder disturbances, and denies abdominal pain, nausea or vomiting. He denies any new musculoskeletal or joint aches or pains, new skin lesions or concerns. Explains he ultimately lost his job in December and has been without work. He reports he is now taking care of his mother who has cancer. A complete review of systems is obtained and is otherwise negative.                               ALLERGIES:  is allergic to shrimp [shellfish allergy].  Meds: Current Outpatient Prescriptions  Medication Sig Dispense Refill  . abacavir-dolutegravir-lamiVUDine (TRIUMEQ) 600-50-300 MG tablet Take 1 tablet by mouth daily with breakfast. 90 tablet 3  . aspirin 81 MG tablet Take 81 mg by mouth daily.    .  B Complex-C (B-COMPLEX WITH VITAMIN C) tablet Take 1 tablet by mouth daily.    . Cholecalciferol (VITAMIN D3) 1000 UNITS tablet Take 1,000 Units by mouth daily. Reported on 11/05/2015    . Chromium Picolinate (CHROMIUM PICOLATE PO) Take 1 tablet by mouth daily.    Marland Kitchen CINNAMON PO Take 1 capsule by mouth daily.    . diphenhydrAMINE (BENADRYL) 25 MG tablet Take 25 mg by mouth at bedtime as needed. For sleep    . escitalopram (LEXAPRO) 20 MG tablet   0  . famotidine (PEPCID AC) 10 MG chewable tablet Chew 10 mg by mouth 2 (two) times daily.    . fenofibrate 160 MG tablet Take 160 mg by mouth daily.    . fish oil-omega-3 fatty acids 1000 MG capsule Take 2 g by mouth daily.     Marland Kitchen GARLIC OIL PO Take 1 tablet by mouth daily.    . INVOKAMET [phone] MG TABS     . loratadine (CLARITIN) 10 MG tablet Take 10 mg by mouth daily.      . metoprolol succinate (TOPROL-XL) 50 MG 24 hr tablet   0  . pravastatin (PRAVACHOL) 20 MG tablet   2  . vitamin C (ASCORBIC ACID) 500 MG tablet Take 1,000 mg by mouth daily.    Marland Kitchen ACCU-CHEK AVIVA PLUS test strip     . bisoprolol-hydrochlorothiazide (ZIAC) 5-6.25 MG tablet Take 1 tablet by mouth daily.    Marland Kitchen DHEA 10  MG CAPS Take by mouth. Reported on 11/05/2015    . Probiotic Product (PROBIOTIC DAILY PO) Take by mouth daily.     No current facility-administered medications for this encounter.     Physical Findings:  weight is 281 lb 9.6 oz (127.7 kg). His blood pressure is 138/85 and his pulse is 73. His respiration is 18 and oxygen saturation is 100%. .  In general this is a well appearing Caucasian male in no acute distress. He's alert and oriented x4 and appropriate throughout the examination. Cardiopulmonary assessment is negative for acute distress and he exhibits normal effort. Neck is free of any appreciable lymph nodes. Scar is unchanged. Higinio Plan growth is mostly normal. Oral cavity was without mucosal lesions. Teeth were in good repair. Tonsils were symmetric.   Lab  Findings: Lab Results  Component Value Date   WBC 6.6 09/06/2016   HGB 15.8 09/06/2016   HCT 46.2 09/06/2016   MCV 89.2 09/06/2016   PLT 229 09/06/2016   Lab Results  Component Value Date   TSH 2.660 11/09/2016    Impression:  The patient is NED.  He is 5 years out from radiation treatment. He has some xerostomia, no other post-radiation adverse effects. His last TSH check was done today, results pending.   Plan:  He will begin seeing a long term survivorship provider in our [place] instead of following up with Dr. Alvy Bimler. He will return for follow up in our clinic as needed.  He should continue to have TSH annually.    Nicholos Johns, PA-C    Tyler Pita, MD  [facility] Direct Dial: [phone]  Fax: [phone] [email or web address]  Skype  LinkedIn  This document serves as a record of services personally performed by Tyler Pita, MD and Freeman Caldron, PA-C. It was created on their behalf by Arlyce Harman, a trained medical scribe. The creation of this record is based on the scribe's personal observations and the provider's statements to them. This document has been checked and approved by the attending provider.

## 2016-11-10 ENCOUNTER — Telehealth: Payer: Self-pay | Admitting: Radiation Oncology

## 2016-11-10 LAB — TSH: TSH: 2.66 m[IU]/L (ref 0.320–4.118)

## 2016-11-10 NOTE — Progress Notes (Signed)
Please call patient with normal result.  Thanks. MM 

## 2016-11-10 NOTE — Telephone Encounter (Signed)
Phoned [facility] with normal TSH results per Dr. Johny Shears order. Patient verbalized understanding and expressed appreciation for the call.

## 2016-11-10 NOTE — Telephone Encounter (Signed)
-----   Message from Jacqulyn Liner, RN sent at 11/10/2016 12:58 PM EDT -----   ----- Message ----- From: Tyler Pita, MD Sent: 11/10/2016  12:27 PM To: [facility] Radiation Nurse  Please call patient with normal result.  Thanks. MM

## 2016-11-28 NOTE — Addendum Note (Signed)
Encounter addended by: Heywood Footman, RN on: 11/28/2016 11:58 AM<BR>    Actions taken: Charge Capture section accepted

## 2016-12-14 ENCOUNTER — Other Ambulatory Visit (HOSPITAL_BASED_OUTPATIENT_CLINIC_OR_DEPARTMENT_OTHER): Payer: BLUE CROSS/BLUE SHIELD

## 2016-12-14 DIAGNOSIS — Z8571 Personal history of Hodgkin lymphoma: Secondary | ICD-10-CM | POA: Diagnosis not present

## 2016-12-14 DIAGNOSIS — C8121 Mixed cellularity classical Hodgkin lymphoma, lymph nodes of head, face, and neck: Secondary | ICD-10-CM

## 2016-12-14 LAB — CBC WITH DIFFERENTIAL/PLATELET
BASO%: 0.4 % (ref 0.0–2.0)
BASOS ABS: 0 10*3/uL (ref 0.0–0.1)
EOS ABS: 0.1 10*3/uL (ref 0.0–0.5)
EOS%: 1.6 % (ref 0.0–7.0)
HCT: 45.1 % (ref 38.4–49.9)
HEMOGLOBIN: 15.3 g/dL (ref 13.0–17.1)
LYMPH%: 45.8 % (ref 14.0–49.0)
MCH: 30.5 pg (ref 27.2–33.4)
MCHC: 34 g/dL (ref 32.0–36.0)
MCV: 89.6 fL (ref 79.3–98.0)
MONO#: 0.4 10*3/uL (ref 0.1–0.9)
MONO%: 6.2 % (ref 0.0–14.0)
NEUT#: 3.2 10*3/uL (ref 1.5–6.5)
NEUT%: 46 % (ref 39.0–75.0)
Platelets: 218 10*3/uL (ref 140–400)
RBC: 5.03 10*6/uL (ref 4.20–5.82)
RDW: 12.6 % (ref 11.0–14.6)
WBC: 7 10*3/uL (ref 4.0–10.3)
lymph#: 3.2 10*3/uL (ref 0.9–3.3)

## 2016-12-14 LAB — COMPREHENSIVE METABOLIC PANEL
ALBUMIN: 4.6 g/dL (ref 3.5–5.0)
ALK PHOS: 44 U/L (ref 40–150)
ALT: 59 U/L — ABNORMAL HIGH (ref 0–55)
AST: 35 U/L — AB (ref 5–34)
Anion Gap: 11 mEq/L (ref 3–11)
BUN: 12.8 mg/dL (ref 7.0–26.0)
CHLORIDE: 101 meq/L (ref 98–109)
CO2: 27 mEq/L (ref 22–29)
Calcium: 9.9 mg/dL (ref 8.4–10.4)
Creatinine: 1.1 mg/dL (ref 0.7–1.3)
EGFR: 80 mL/min/{1.73_m2} — ABNORMAL LOW (ref >90)
GLUCOSE: 103 mg/dL (ref 70–140)
POTASSIUM: 4.2 meq/L (ref 3.5–5.1)
SODIUM: 139 meq/L (ref 136–145)
Total Bilirubin: 0.43 mg/dL (ref 0.20–1.20)
Total Protein: 7.7 g/dL (ref 6.4–8.3)

## 2016-12-21 ENCOUNTER — Telehealth: Payer: Self-pay | Admitting: Adult Health

## 2016-12-21 ENCOUNTER — Encounter: Payer: Self-pay | Admitting: Adult Health

## 2016-12-21 ENCOUNTER — Ambulatory Visit (HOSPITAL_BASED_OUTPATIENT_CLINIC_OR_DEPARTMENT_OTHER): Payer: BLUE CROSS/BLUE SHIELD | Admitting: Adult Health

## 2016-12-21 VITALS — BP 139/81 | HR 78 | Temp 98.4°F | Resp 20 | Ht 70.0 in | Wt 277.9 lb

## 2016-12-21 DIAGNOSIS — Z8571 Personal history of Hodgkin lymphoma: Secondary | ICD-10-CM | POA: Diagnosis not present

## 2016-12-21 NOTE — Telephone Encounter (Signed)
Gave patient avs report and appointments for May 2019

## 2016-12-21 NOTE — Progress Notes (Signed)
CLINIC:  Survivorship   REASON FOR VISIT:  Routine follow-up for history of lymphoma   BRIEF ONCOLOGIC HISTORY:  Thispatient was diagnosed with classical Hodgkin lymphoma, stage IIA last year after presenting initially with palpable lymphadenopathy in his neck. The patient was treated with several cycles of ABVD from April 2013 to August 2013. Subsequently he underwent consolidation radiation treatment. His PET/CT scan from November 2014 showed no evidence of disease recurrence.  INTERVAL HISTORY:  Jon Shannon presents to the Survivorship Clinic today for routine follow-up for his h/o lymphoma.  He is doing moderately well today.  He says since treatment he cannot handle heat any longer.  He also has peripheral neuropathy in his hands and feet.  He can feel pressure but nothing else.  He has lost his ability to feel pain superficially on the skin.  It occasionally interferes with his ability to button or pick up small items.  It does not interfere with his ability to walk.    Jon Shannon has longstanding h/o depression and today is not a good day for him.  He was seeing a Social worker, but insurance will not pay. He takes lexapro daily.       REVIEW OF SYSTEMS:  Review of Systems  Constitutional: Negative for appetite change, chills, diaphoresis, fatigue, fever and unexpected weight change.  HENT:   Negative for hearing loss and lump/mass.   Eyes: Negative for eye problems and icterus.  Respiratory: Negative for chest tightness, cough and shortness of breath.   Cardiovascular: Negative for chest pain, leg swelling and palpitations.  Gastrointestinal: Negative for abdominal distention and abdominal pain.  Endocrine: Negative for hot flashes.  Musculoskeletal: Negative for arthralgias.  Neurological: Negative for dizziness, extremity weakness and numbness.  Psychiatric/Behavioral: Positive for depression. Negative for suicidal ideas. The patient is not nervous/anxious.       PAST  MEDICAL/SURGICAL HISTORY:  Past Medical History:  Diagnosis Date  . DJD (degenerative joint disease)   . Elevated serum creatinine 06/15/2014  . Hepatitis A    resolved.   Marland Kitchen HIV INFECTION 11/30/2009   no history of opportunistic infection  . Hodgkin's lymphoma ([place]) 11/09/2011  . Hx of radiation therapy 04/03/12 -04/29/12   Hodgkin's disease -neck  . Hyperlipidemia 08/07/2011  . HYPERTENSION 11/30/2009  . Hypertriglyceridemia   . PTSD (post-traumatic stress disorder)   . Rectal bleeding 09/04/2013  . Weight loss 05/29/2013   Past Surgical History:  Procedure Laterality Date  . left cervical node excisional biopsy  10/2011  . left index finger trauma repair    . Powerport    . TONSILLECTOMY     as teenager     ALLERGIES:  Allergies  Allergen Reactions  . Shrimp [Shellfish Allergy] Anaphylaxis    Lobster, too     CURRENT MEDICATIONS:  Outpatient Encounter Prescriptions as of 12/21/2016  Medication Sig Note  . abacavir-dolutegravir-lamiVUDine (TRIUMEQ) 600-50-300 MG tablet Take 1 tablet by mouth daily with breakfast.   . ACCU-CHEK AVIVA PLUS test strip  12/03/2013: Received from: External Pharmacy  . aspirin 81 MG tablet Take 81 mg by mouth daily.   . B Complex-C (B-COMPLEX WITH VITAMIN C) tablet Take 1 tablet by mouth daily.   . bisoprolol-hydrochlorothiazide (ZIAC) 5-6.25 MG tablet Take 1 tablet by mouth daily.   . Cholecalciferol (VITAMIN D3) 1000 UNITS tablet Take 1,000 Units by mouth daily. Reported on 11/05/2015   . Chromium Picolinate (CHROMIUM PICOLATE PO) Take 1 tablet by mouth daily.   Marland Kitchen CINNAMON PO Take 1 capsule  by mouth daily.   Marland Kitchen DHEA 10 MG CAPS Take by mouth. Reported on 11/05/2015 06/15/2014: Ran out  . diphenhydrAMINE (BENADRYL) 25 MG tablet Take 25 mg by mouth at bedtime as needed. For sleep   . escitalopram (LEXAPRO) 20 MG tablet    . famotidine (PEPCID AC) 10 MG chewable tablet Chew 10 mg by mouth 2 (two) times daily. 12/03/2013: Prn only  . fenofibrate 160 MG tablet  Take 160 mg by mouth daily.   . fish oil-omega-3 fatty acids 1000 MG capsule Take 2 g by mouth daily.    Marland Kitchen GARLIC OIL PO Take 1 tablet by mouth daily.   Claudine Mouton [phone] MG TABS  11/05/2014: Received from: External Pharmacy  . loratadine (CLARITIN) 10 MG tablet Take 10 mg by mouth daily.   11/05/2014: 11/05/14 takes 20 mg  . metoprolol succinate (TOPROL-XL) 50 MG 24 hr tablet    . pravastatin (PRAVACHOL) 20 MG tablet    . Probiotic Product (PROBIOTIC DAILY PO) Take by mouth daily.   . vitamin C (ASCORBIC ACID) 500 MG tablet Take 1,000 mg by mouth daily.    No facility-administered encounter medications on file as of 12/21/2016.      ONCOLOGIC FAMILY HISTORY:  Family History  Problem Relation Age of Onset  . Cancer Mother 42       breast cancer   . Seizures Mother   . Hypertension Mother   . Hyperlipidemia Mother   . Diabetes Father   . Hypertension Father   . Hyperlipidemia Father   . Cancer Father        brain tumor (unknown type)  . Cancer Maternal Aunt        lung cancer  . Cancer Maternal Grandmother        CLL      SOCIAL HISTORY:  Jon Shannon is single and lives with his parents and nephew in [place], [place].  Jon Shannon  has no children.  Currently unemployed.  Denies any current or history of tobacco, alcohol, or illicit drug use.   PHYSICAL EXAMINATION:  Vital Signs: Vitals:   12/21/16 1351  BP: 139/81  Pulse: 78  Resp: 20  Temp: 98.4 F (36.9 C)   Filed Weights   12/21/16 1351  Weight: 277 lb 14.4 oz (126.1 kg)   General: Well-nourished, well-appearing male in no acute distress. Unaccompanied today. HEENT: Head is normocephalic.  Pupils equal and reactive to light. Conjunctivae clear without exudate.  Sclerae anicteric. Oral mucosa is pink, moist.  Oropharynx is pink without lesions or erythema.  Lymph: No cervical, supraclavicular, infraclavicular, or axillary lymphadenopathy noted on palpation.  Cardiovascular: Regular rate and  rhythm.Marland Kitchen Respiratory: Clear to auscultation bilaterally. Chest expansion symmetric; breathing non-labored.  GI: Abdomen soft and round; non-tender, non-distended. Bowel sounds normoactive. No hepatosplenomegaly.   GU: Deferred.  Neuro: No focal deficits. Steady gait.  Psych: Mood and affect normal and appropriate for situation.  Extremities: No edema. Skin: Warm and dry.   LABORATORY DATA:  No visits with results within 1 Day(s) from this visit.  Latest known visit with results is:  Appointment on 12/14/2016  Component Date Value Ref Range Status  . WBC 12/14/2016 7.0  4.0 - 10.3 10e3/uL Final  . NEUT# 12/14/2016 3.2  1.5 - 6.5 10e3/uL Final  . HGB 12/14/2016 15.3  13.0 - 17.1 g/dL Final  . HCT 12/14/2016 45.1  38.4 - 49.9 % Final  . Platelets 12/14/2016 218  140 - 400 10e3/uL Final  . MCV  12/14/2016 89.6  79.3 - 98.0 fL Final  . MCH 12/14/2016 30.5  27.2 - 33.4 pg Final  . MCHC 12/14/2016 34.0  32.0 - 36.0 g/dL Final  . RBC 12/14/2016 5.03  4.20 - 5.82 10e6/uL Final  . RDW 12/14/2016 12.6  11.0 - 14.6 % Final  . lymph# 12/14/2016 3.2  0.9 - 3.3 10e3/uL Final  . MONO# 12/14/2016 0.4  0.1 - 0.9 10e3/uL Final  . Eosinophils Absolute 12/14/2016 0.1  0.0 - 0.5 10e3/uL Final  . Basophils Absolute 12/14/2016 0.0  0.0 - 0.1 10e3/uL Final  . NEUT% 12/14/2016 46.0  39.0 - 75.0 % Final  . LYMPH% 12/14/2016 45.8  14.0 - 49.0 % Final  . MONO% 12/14/2016 6.2  0.0 - 14.0 % Final  . EOS% 12/14/2016 1.6  0.0 - 7.0 % Final  . BASO% 12/14/2016 0.4  0.0 - 2.0 % Final  . Sodium 12/14/2016 139  136 - 145 mEq/L Final  . Potassium 12/14/2016 4.2  3.5 - 5.1 mEq/L Final  . Chloride 12/14/2016 101  98 - 109 mEq/L Final  . CO2 12/14/2016 27  22 - 29 mEq/L Final  . Glucose 12/14/2016 103  70 - 140 mg/dl Final   Glucose reference range is for nonfasting patients. Fasting glucose reference range is 70- 100.  Marland Kitchen BUN 12/14/2016 12.8  7.0 - 26.0 mg/dL Final  . Creatinine 12/14/2016 1.1  0.7 - 1.3 mg/dL Final   . Total Bilirubin 12/14/2016 0.43  0.20 - 1.20 mg/dL Final  . Alkaline Phosphatase 12/14/2016 44  40 - 150 U/L Final  . AST 12/14/2016 35* 5 - 34 U/L Final  . ALT 12/14/2016 59* 0 - 55 U/L Final  . Total Protein 12/14/2016 7.7  6.4 - 8.3 g/dL Final  . Albumin 12/14/2016 4.6  3.5 - 5.0 g/dL Final  . Calcium 12/14/2016 9.9  8.4 - 10.4 mg/dL Final  . Anion Gap 12/14/2016 11  3 - 11 mEq/L Final  . EGFR 12/14/2016 80* >90 ml/min/1.73 m2 Final   eGFR is calculated using the CKD-EPI Creatinine Equation (2009)    DIAGNOSTIC IMAGING:  None for this visit     ASSESSMENT AND PLAN:  Jon Shannon is a pleasant 51 y.o. male with history of lymphoma , diagnosed in 2013; treated with chemotherapy and radiation. Jon Shannon presents to the Survivorship Clinic for surveillance and routine follow-up.   1. History of Lymphoma:  Jon Shannon is currently clinically and radiographically without evidence of disease or recurrence of lymphoma. He will follow-up in the Survivorship Clinic in 1 year with labs, history, and physical exam per surveillance protocol.  I encouraged him to call me with any questions or concerns before his next visit at the cancer center, and I would be happy to see the patient sooner, if needed.    2. Cancer screening:  Due to Jon Shannon's history and age, he should receive screening for skin cancers, breast cancer, colon cancer. The patient was encouraged to follow-up with his PCP for appropriate cancer screenings.   3. Health maintenance and wellness promotion: Jon Shannon was encouraged to consume 5-7 servings of fruits and vegetables per day. The patient was also encouraged to engage in moderate to vigorous exercise for 30 minutes per day most days of the week. Jon Shannon was instructed to limit her alcohol consumption and continue to abstain from tobacco use.    Dispo:  -Return to cancer center to see Survivorship NP in one year   A total of (30)  minutes of face-to-face  time was spent with this patient with greater than 50% of that time in counseling and care-coordination.   Gardenia Phlegm, [place] [phone]   Note: PRIMARY CARE PROVIDER [name], [place] [phone]

## 2016-12-27 ENCOUNTER — Encounter: Payer: Self-pay | Admitting: Infectious Diseases

## 2016-12-28 ENCOUNTER — Encounter: Payer: Self-pay | Admitting: Infectious Diseases

## 2017-02-15 DIAGNOSIS — J329 Chronic sinusitis, unspecified: Secondary | ICD-10-CM | POA: Diagnosis not present

## 2017-02-15 DIAGNOSIS — S39012A Strain of muscle, fascia and tendon of lower back, initial encounter: Secondary | ICD-10-CM | POA: Diagnosis not present

## 2017-02-15 DIAGNOSIS — Z6838 Body mass index (BMI) 38.0-38.9, adult: Secondary | ICD-10-CM | POA: Diagnosis not present

## 2017-02-15 DIAGNOSIS — H8120 Vestibular neuronitis, unspecified ear: Secondary | ICD-10-CM | POA: Diagnosis not present

## 2017-02-19 ENCOUNTER — Other Ambulatory Visit (HOSPITAL_COMMUNITY)
Admission: RE | Admit: 2017-02-19 | Discharge: 2017-02-19 | Disposition: A | Discharge status: Home / Self Care | Payer: BLUE CROSS/BLUE SHIELD | Source: Ambulatory Visit | Attending: Infectious Diseases | Admitting: Infectious Diseases

## 2017-02-19 ENCOUNTER — Other Ambulatory Visit: Payer: BLUE CROSS/BLUE SHIELD

## 2017-02-19 DIAGNOSIS — B20 Human immunodeficiency virus [HIV] disease: Secondary | ICD-10-CM | POA: Insufficient documentation

## 2017-02-19 DIAGNOSIS — Z113 Encounter for screening for infections with a predominantly sexual mode of transmission: Secondary | ICD-10-CM

## 2017-02-19 DIAGNOSIS — E781 Pure hyperglyceridemia: Secondary | ICD-10-CM

## 2017-02-19 LAB — CBC
HCT: 44.1 % (ref 38.5–50.0)
Hemoglobin: 15.1 g/dL (ref 13.2–17.1)
MCH: 30.4 pg (ref 27.0–33.0)
MCHC: 34.2 g/dL (ref 32.0–36.0)
MCV: 88.7 fL (ref 80.0–100.0)
MPV: 9.6 fL (ref 7.5–12.5)
Platelets: 193 10*3/uL (ref 140–400)
RBC: 4.97 MIL/uL (ref 4.20–5.80)
RDW: 13.4 % (ref 11.0–15.0)
WBC: 6.5 10*3/uL (ref 3.8–10.8)

## 2017-02-19 LAB — COMPREHENSIVE METABOLIC PANEL
ALK PHOS: 40 U/L (ref 40–115)
ALT: 45 U/L (ref 9–46)
AST: 31 U/L (ref 10–35)
Albumin: 4.7 g/dL (ref 3.6–5.1)
BILIRUBIN TOTAL: 0.4 mg/dL (ref 0.2–1.2)
BUN: 16 mg/dL (ref 7–25)
CO2: 22 mmol/L (ref 20–31)
Calcium: 9.6 mg/dL (ref 8.6–10.3)
Chloride: 103 mmol/L (ref 98–110)
Creat: 1.13 mg/dL (ref 0.70–1.33)
Glucose, Bld: 100 mg/dL — ABNORMAL HIGH (ref 65–99)
Potassium: 4.2 mmol/L (ref 3.5–5.3)
Sodium: 137 mmol/L (ref 135–146)
Total Protein: 6.9 g/dL (ref 6.1–8.1)

## 2017-02-19 LAB — LIPID PANEL
CHOL/HDL RATIO: 6.9 ratio — AB (ref <5.0)
Cholesterol: 172 mg/dL (ref <200)
HDL: 25 mg/dL — AB (ref >40)
Triglycerides: 868 mg/dL — ABNORMAL HIGH (ref <150)

## 2017-02-20 LAB — URINE CYTOLOGY ANCILLARY ONLY
CHLAMYDIA, DNA PROBE: NEGATIVE
NEISSERIA GONORRHEA: NEGATIVE

## 2017-02-20 LAB — T-HELPER CELL (CD4) - (RCID CLINIC ONLY)
CD4 % Helper T Cell: 28 % — ABNORMAL LOW (ref 33–55)
CD4 T CELL ABS: 710 /uL (ref 400–2700)

## 2017-02-21 LAB — HIV-1 RNA QUANT-NO REFLEX-BLD
HIV 1 RNA QUANT: DETECTED {copies}/mL — AB
HIV-1 RNA Quant, Log: <1.3 Log copies/mL — AB

## 2017-02-22 ENCOUNTER — Encounter: Payer: Self-pay | Admitting: Infectious Diseases

## 2017-03-05 ENCOUNTER — Encounter: Payer: Self-pay | Admitting: Infectious Diseases

## 2017-03-05 ENCOUNTER — Ambulatory Visit (INDEPENDENT_AMBULATORY_CARE_PROVIDER_SITE_OTHER): Payer: BLUE CROSS/BLUE SHIELD | Admitting: Infectious Diseases

## 2017-03-05 VITALS — BP 151/62 | HR 73 | Temp 98.0°F | Ht 72.0 in | Wt 285.0 lb

## 2017-03-05 DIAGNOSIS — Z8571 Personal history of Hodgkin lymphoma: Secondary | ICD-10-CM

## 2017-03-05 DIAGNOSIS — B20 Human immunodeficiency virus [HIV] disease: Secondary | ICD-10-CM

## 2017-03-05 DIAGNOSIS — L97509 Non-pressure chronic ulcer of other part of unspecified foot with unspecified severity: Secondary | ICD-10-CM

## 2017-03-05 DIAGNOSIS — F17200 Nicotine dependence, unspecified, uncomplicated: Secondary | ICD-10-CM | POA: Diagnosis not present

## 2017-03-05 DIAGNOSIS — E118 Type 2 diabetes mellitus with unspecified complications: Secondary | ICD-10-CM | POA: Insufficient documentation

## 2017-03-05 DIAGNOSIS — Z113 Encounter for screening for infections with a predominantly sexual mode of transmission: Secondary | ICD-10-CM

## 2017-03-05 DIAGNOSIS — E781 Pure hyperglyceridemia: Secondary | ICD-10-CM

## 2017-03-05 DIAGNOSIS — E11621 Type 2 diabetes mellitus with foot ulcer: Secondary | ICD-10-CM

## 2017-03-05 DIAGNOSIS — I10 Essential (primary) hypertension: Secondary | ICD-10-CM | POA: Diagnosis not present

## 2017-03-05 NOTE — Assessment & Plan Note (Signed)
apprecite PCP f/u  consider endo?

## 2017-03-05 NOTE — Assessment & Plan Note (Signed)
On his R great toe there are 2 small areas of callus with dried scab within.  He is offered podiatry but defers.

## 2017-03-05 NOTE — Assessment & Plan Note (Signed)
Encouraged him to exercise, monitor diet.

## 2017-03-05 NOTE — Progress Notes (Signed)
   Subjective:    Patient ID: Jon Shannon, male    DOB: [birth year], 51 y.o.   MRN: [id number]  HPI 51yo M with HIV+, previous stage IIa Hodgkin's lymphoma (2012), tx with ABVD 10-2011 with resultant neuropathy. He had onc f/u May 2018, clear. Has DM as well from steroids. Severe hypertriglyceridemia (Trig 868 on 01-2017). Has PCP in [place] Chancy Milroy, J).  Taking 4g fish oil, fenofibrate, and pravastatin.  On triumeq.  Had vertigo and fell, sprained his back. Has L elbow pain after straining lifting wts, Still having pain with picking up items.  FSG has been going well- last check was 100. Has not been able to afford test strips after changing health insurance. Uses his dad's.  Going to gym qam.  Wt has been up 8 #.   HIV 1 RNA Quant (copies/mL)  Date Value  02/19/2017 <20 DETECTED (A)  09/06/2016 <20 NOT DETECTED  03/06/2016 <20   CD4 T Cell Abs (/uL)  Date Value  02/19/2017 710  09/06/2016 820  03/06/2016 840    Review of Systems  Constitutional: Positive for unexpected weight change. Negative for appetite change.  Gastrointestinal: Negative for constipation and diarrhea.  Genitourinary: Negative for difficulty urinating.  Neurological: Positive for numbness.  Hematological: Bruises/bleeds easily.  BM slightly loose due to cholesterol meds-o Bruising on R foot. After fall.  Has had ophtho this year.     Objective:   Physical Exam  Constitutional: He appears well-developed and well-nourished.  HENT:  Mouth/Throat: No oropharyngeal exudate.  Eyes: Pupils are equal, round, and reactive to light. EOM are normal.  Neck: Neck supple.  Cardiovascular: Normal rate, regular rhythm and normal heart sounds.   Pulses:      Dorsalis pedis pulses are 2+ on the right side, and 2+ on the left side.  Pulmonary/Chest: Effort normal and breath sounds normal.  Abdominal: Soft. Bowel sounds are normal. There is no tenderness. There is no rebound.  Musculoskeletal: He exhibits no edema.    Lymphadenopathy:    He has no cervical adenopathy.      Assessment & Plan:

## 2017-03-05 NOTE — Assessment & Plan Note (Signed)
Appreciate h/o f/u He is doing well.

## 2017-03-05 NOTE — Assessment & Plan Note (Signed)
Mildly elevated Will f/u with PCP

## 2017-03-05 NOTE — Assessment & Plan Note (Signed)
He is doing well Offered/refused condoms.  vax are up to date.  rtc in 6 months

## 2017-03-05 NOTE — Assessment & Plan Note (Signed)
Encouraged wt loss He will monitor at home.  Will f/u with PCP

## 2017-03-05 NOTE — Assessment & Plan Note (Signed)
Has quit

## 2017-04-03 ENCOUNTER — Encounter: Payer: Self-pay | Admitting: Infectious Diseases

## 2017-05-07 DIAGNOSIS — M7712 Lateral epicondylitis, left elbow: Secondary | ICD-10-CM | POA: Diagnosis not present

## 2017-05-07 DIAGNOSIS — Z23 Encounter for immunization: Secondary | ICD-10-CM | POA: Diagnosis not present

## 2017-05-07 DIAGNOSIS — Z6838 Body mass index (BMI) 38.0-38.9, adult: Secondary | ICD-10-CM | POA: Diagnosis not present

## 2017-05-07 DIAGNOSIS — M353 Polymyalgia rheumatica: Secondary | ICD-10-CM | POA: Diagnosis not present

## 2017-05-07 DIAGNOSIS — E782 Mixed hyperlipidemia: Secondary | ICD-10-CM | POA: Diagnosis not present

## 2017-05-07 DIAGNOSIS — E119 Type 2 diabetes mellitus without complications: Secondary | ICD-10-CM | POA: Diagnosis not present

## 2017-06-12 ENCOUNTER — Telehealth: Payer: Self-pay | Admitting: *Deleted

## 2017-06-12 NOTE — Telephone Encounter (Signed)
Patient called stating he is going to be taking care of his three year old niece and she has chicken pox. He wanted to know what his risks are. Advised patient he can not get shingles from chicken pox. He will discuss the shingles vaccination at his next visit with Dr. Johnnye Sima. Myrtis Hopping

## 2017-07-02 ENCOUNTER — Telehealth: Payer: Self-pay | Admitting: Hematology and Oncology

## 2017-07-02 NOTE — Telephone Encounter (Signed)
07/02/17 faxed medical records to [facility] [place] to [phone] successfully @ 5:09 pm

## 2017-08-22 ENCOUNTER — Other Ambulatory Visit: Payer: BLUE CROSS/BLUE SHIELD

## 2017-08-22 ENCOUNTER — Other Ambulatory Visit (HOSPITAL_COMMUNITY)
Admission: RE | Admit: 2017-08-22 | Discharge: 2017-08-22 | Disposition: A | Discharge status: Home / Self Care | Payer: BLUE CROSS/BLUE SHIELD | Source: Ambulatory Visit | Attending: Infectious Diseases | Admitting: Infectious Diseases

## 2017-08-22 DIAGNOSIS — Z113 Encounter for screening for infections with a predominantly sexual mode of transmission: Secondary | ICD-10-CM | POA: Diagnosis not present

## 2017-08-22 DIAGNOSIS — B20 Human immunodeficiency virus [HIV] disease: Secondary | ICD-10-CM | POA: Insufficient documentation

## 2017-08-22 DIAGNOSIS — E781 Pure hyperglyceridemia: Secondary | ICD-10-CM

## 2017-08-23 LAB — COMPLETE METABOLIC PANEL WITH GFR
AG Ratio: 2 (calc) (ref 1.0–2.5)
ALT: 57 U/L — ABNORMAL HIGH (ref 9–46)
AST: 50 U/L — ABNORMAL HIGH (ref 10–35)
Albumin: 4.7 g/dL (ref 3.6–5.1)
Alkaline phosphatase (APISO): 34 U/L — ABNORMAL LOW (ref 40–115)
BUN: 17 mg/dL (ref 7–25)
CO2: 26 mmol/L (ref 20–32)
Calcium: 10 mg/dL (ref 8.6–10.3)
Chloride: 102 mmol/L (ref 98–110)
Creat: 1.22 mg/dL (ref 0.70–1.33)
GFR, Est African American: 79 mL/min/{1.73_m2} (ref >=60)
GFR, Est Non African American: 68 mL/min/{1.73_m2} (ref >=60)
Globulin: 2.4 g/dL (calc) (ref 1.9–3.7)
Glucose, Bld: 104 mg/dL — ABNORMAL HIGH (ref 65–99)
Potassium: 4.5 mmol/L (ref 3.5–5.3)
Sodium: 138 mmol/L (ref 135–146)
Total Bilirubin: 0.4 mg/dL (ref 0.2–1.2)
Total Protein: 7.1 g/dL (ref 6.1–8.1)

## 2017-08-23 LAB — CBC
HEMATOCRIT: 44.6 % (ref 38.5–50.0)
HEMOGLOBIN: 15.4 g/dL (ref 13.2–17.1)
MCH: 30.1 pg (ref 27.0–33.0)
MCHC: 34.5 g/dL (ref 32.0–36.0)
MCV: 87.1 fL (ref 80.0–100.0)
MPV: 9.8 fL (ref 7.5–12.5)
Platelets: 243 10*3/uL (ref 140–400)
RBC: 5.12 10*6/uL (ref 4.20–5.80)
RDW: 13.6 % (ref 11.0–15.0)
WBC: 6.6 10*3/uL (ref 3.8–10.8)

## 2017-08-23 LAB — T-HELPER CELL (CD4) - (RCID CLINIC ONLY)
CD4 T CELL HELPER: 28 % — AB (ref 33–55)
CD4 T Cell Abs: 810 /uL (ref 400–2700)

## 2017-08-23 LAB — LIPID PANEL
CHOLESTEROL: 162 mg/dL (ref <200)
HDL: 28 mg/dL — ABNORMAL LOW (ref >40)
Non-HDL Cholesterol (Calc): 134 mg/dL (calc) — ABNORMAL HIGH (ref <130)
Total CHOL/HDL Ratio: 5.8 (calc) — ABNORMAL HIGH (ref <5.0)
Triglycerides: 662 mg/dL — ABNORMAL HIGH (ref <150)

## 2017-08-23 LAB — RPR: RPR Ser Ql: NONREACTIVE

## 2017-08-23 LAB — URINE CYTOLOGY ANCILLARY ONLY
Chlamydia: NEGATIVE
Neisseria Gonorrhea: NEGATIVE

## 2017-08-24 ENCOUNTER — Encounter: Payer: Self-pay | Admitting: Infectious Diseases

## 2017-08-24 LAB — HIV-1 RNA QUANT-NO REFLEX-BLD
HIV 1 RNA Quant: <20 copies/mL
HIV-1 RNA Quant, Log: <1.3 Log copies/mL

## 2017-09-05 ENCOUNTER — Encounter: Payer: Self-pay | Admitting: Infectious Diseases

## 2017-09-05 ENCOUNTER — Ambulatory Visit: Payer: BLUE CROSS/BLUE SHIELD | Admitting: Infectious Diseases

## 2017-09-05 VITALS — BP 138/84 | HR 73 | Temp 98.6°F | Wt 277.8 lb

## 2017-09-05 DIAGNOSIS — F4310 Post-traumatic stress disorder, unspecified: Secondary | ICD-10-CM | POA: Diagnosis not present

## 2017-09-05 DIAGNOSIS — E781 Pure hyperglyceridemia: Secondary | ICD-10-CM

## 2017-09-05 DIAGNOSIS — B20 Human immunodeficiency virus [HIV] disease: Secondary | ICD-10-CM | POA: Diagnosis not present

## 2017-09-05 DIAGNOSIS — Z79899 Other long term (current) drug therapy: Secondary | ICD-10-CM

## 2017-09-05 DIAGNOSIS — Z113 Encounter for screening for infections with a predominantly sexual mode of transmission: Secondary | ICD-10-CM | POA: Diagnosis not present

## 2017-09-05 DIAGNOSIS — I10 Essential (primary) hypertension: Secondary | ICD-10-CM | POA: Diagnosis not present

## 2017-09-05 DIAGNOSIS — E11621 Type 2 diabetes mellitus with foot ulcer: Secondary | ICD-10-CM | POA: Diagnosis not present

## 2017-09-05 DIAGNOSIS — F341 Dysthymic disorder: Secondary | ICD-10-CM

## 2017-09-05 DIAGNOSIS — L97509 Non-pressure chronic ulcer of other part of unspecified foot with unspecified severity: Secondary | ICD-10-CM | POA: Diagnosis not present

## 2017-09-05 NOTE — Assessment & Plan Note (Signed)
He is doing well Has gotten flu shot. Other vax up to date.  Can get shingrix. Will give this fall in clinic.  Will see him back in 9 months.  Offered/refused condoms.

## 2017-09-05 NOTE — Assessment & Plan Note (Signed)
A letter supporting his disability application is written with/for him.

## 2017-09-05 NOTE — Progress Notes (Signed)
   Subjective:    Patient ID: Jon Shannon, male    DOB: 06/17/1966, 52 y.o.   MRN: [id number]  HPI 52yo M with HIV+, previous stage IIa Hodgkin's lymphoma (2012), tx with ABVD 10-2011 with resultant neuropathy. He had onc f/u May 2018, clear. Has DM as well from steroids. Severe hypertriglyceridemia (Trig 662 on 07-2017). Has PCP in [place] Chancy Milroy, J).  Taking 4g fish oil, fenofibrate, and pravastatin.  On triumeq.  FSG has been in 100-115.  Working on disability. Multiple stressors- family issues, significant depression. Has issues with back taxes.  Has not been able to see his previous counselor due to finances.  Has been exercising.   HIV 1 RNA Quant (copies/mL)  Date Value  08/22/2017 <20 NOT DETECTED  02/19/2017 <20 DETECTED (A)  09/06/2016 <20 NOT DETECTED   CD4 T Cell Abs (/uL)  Date Value  08/22/2017 810  02/19/2017 710  09/06/2016 820    Review of Systems  Constitutional: Negative for appetite change and unexpected weight change.  Respiratory: Negative for cough and shortness of breath.   Cardiovascular: Negative for chest pain.  Gastrointestinal: Negative for constipation and diarrhea.  Genitourinary: Negative for difficulty urinating.  Neurological: Positive for numbness.  Psychiatric/Behavioral: Positive for dysphoric mood.  Please see HPI. All other systems reviewed and negative.     Objective:   Physical Exam  Constitutional: He is oriented to person, place, and time. He appears well-developed and well-nourished.  HENT:  Mouth/Throat: No oropharyngeal exudate.  Eyes: EOM are normal. Pupils are equal, round, and reactive to light.  Neck: Neck supple.  Cardiovascular: Normal rate, regular rhythm and normal heart sounds.  Pulmonary/Chest: Effort normal and breath sounds normal.  Abdominal: Soft. Bowel sounds are normal. There is no tenderness. There is no rebound.  Musculoskeletal: He exhibits no edema.       Feet:  Lymphadenopathy:    He has no  cervical adenopathy.  Neurological: He is alert and oriented to person, place, and time. A sensory deficit is present.  Psychiatric: He has a normal mood and affect.      Assessment & Plan:

## 2017-09-05 NOTE — Assessment & Plan Note (Signed)
Remains elevated On rx. Appreciate PCP f/u.

## 2017-09-05 NOTE — Progress Notes (Signed)
See note

## 2017-09-05 NOTE — Assessment & Plan Note (Signed)
BP well controlled appreciate PCP f/u

## 2017-09-05 NOTE — Assessment & Plan Note (Signed)
Will get him in with our counselor.  Multiple family stressors.

## 2017-09-05 NOTE — Assessment & Plan Note (Signed)
He appears to be doing well I encouraged him to lose wt, exercise.  Appreciate PCP f/u.

## 2017-09-12 ENCOUNTER — Other Ambulatory Visit: Payer: Self-pay | Admitting: Infectious Diseases

## 2017-09-12 DIAGNOSIS — C8190 Hodgkin lymphoma, unspecified, unspecified site: Secondary | ICD-10-CM

## 2017-09-13 ENCOUNTER — Other Ambulatory Visit: Payer: Self-pay | Admitting: *Deleted

## 2017-09-13 ENCOUNTER — Ambulatory Visit: Payer: BLUE CROSS/BLUE SHIELD

## 2017-09-13 DIAGNOSIS — C8190 Hodgkin lymphoma, unspecified, unspecified site: Secondary | ICD-10-CM

## 2017-09-13 MED ORDER — ABACAVIR-DOLUTEGRAVIR-LAMIVUD 600-50-300 MG PO TABS: 1.0000 | ORAL_TABLET | Freq: Every day | ORAL | 3 refills | Status: DC

## 2017-09-20 ENCOUNTER — Ambulatory Visit: Payer: BLUE CROSS/BLUE SHIELD

## 2017-09-20 DIAGNOSIS — M205X2 Other deformities of toe(s) (acquired), left foot: Secondary | ICD-10-CM | POA: Diagnosis not present

## 2017-09-20 DIAGNOSIS — E1142 Type 2 diabetes mellitus with diabetic polyneuropathy: Secondary | ICD-10-CM | POA: Diagnosis not present

## 2017-09-20 DIAGNOSIS — M722 Plantar fascial fibromatosis: Secondary | ICD-10-CM | POA: Diagnosis not present

## 2017-09-20 DIAGNOSIS — M12571 Traumatic arthropathy, right ankle and foot: Secondary | ICD-10-CM | POA: Diagnosis not present

## 2017-09-27 ENCOUNTER — Ambulatory Visit: Payer: BLUE CROSS/BLUE SHIELD

## 2017-10-04 ENCOUNTER — Ambulatory Visit: Payer: BLUE CROSS/BLUE SHIELD

## 2017-10-11 ENCOUNTER — Ambulatory Visit: Payer: BLUE CROSS/BLUE SHIELD

## 2017-10-18 ENCOUNTER — Ambulatory Visit: Payer: BLUE CROSS/BLUE SHIELD

## 2017-10-25 ENCOUNTER — Ambulatory Visit: Payer: BLUE CROSS/BLUE SHIELD

## 2017-11-01 ENCOUNTER — Ambulatory Visit: Payer: BLUE CROSS/BLUE SHIELD

## 2017-11-08 ENCOUNTER — Ambulatory Visit: Payer: BLUE CROSS/BLUE SHIELD

## 2017-11-09 DIAGNOSIS — R809 Proteinuria, unspecified: Secondary | ICD-10-CM | POA: Diagnosis not present

## 2017-11-09 DIAGNOSIS — E1169 Type 2 diabetes mellitus with other specified complication: Secondary | ICD-10-CM | POA: Diagnosis not present

## 2017-11-09 DIAGNOSIS — E1129 Type 2 diabetes mellitus with other diabetic kidney complication: Secondary | ICD-10-CM | POA: Diagnosis not present

## 2017-11-09 DIAGNOSIS — E782 Mixed hyperlipidemia: Secondary | ICD-10-CM | POA: Diagnosis not present

## 2017-11-15 ENCOUNTER — Encounter: Payer: Self-pay | Admitting: Infectious Diseases

## 2017-11-15 ENCOUNTER — Ambulatory Visit: Payer: BLUE CROSS/BLUE SHIELD

## 2017-11-15 ENCOUNTER — Other Ambulatory Visit: Payer: Self-pay | Admitting: *Deleted

## 2017-11-15 ENCOUNTER — Encounter: Payer: Self-pay | Admitting: *Deleted

## 2017-11-15 DIAGNOSIS — C8190 Hodgkin lymphoma, unspecified, unspecified site: Secondary | ICD-10-CM

## 2017-11-22 ENCOUNTER — Ambulatory Visit: Payer: BLUE CROSS/BLUE SHIELD

## 2017-11-29 ENCOUNTER — Ambulatory Visit: Payer: BLUE CROSS/BLUE SHIELD

## 2017-11-29 ENCOUNTER — Telehealth: Payer: Self-pay | Admitting: Hematology and Oncology

## 2017-11-29 NOTE — Telephone Encounter (Signed)
LC pal. Moved Lab/LTS  from 5/23 to 5/31. Spoke w/ pt and he is aware.

## 2017-12-06 ENCOUNTER — Ambulatory Visit: Payer: BLUE CROSS/BLUE SHIELD

## 2017-12-13 ENCOUNTER — Ambulatory Visit: Payer: BLUE CROSS/BLUE SHIELD

## 2017-12-20 ENCOUNTER — Encounter: Payer: BLUE CROSS/BLUE SHIELD | Admitting: Adult Health

## 2017-12-20 ENCOUNTER — Ambulatory Visit: Payer: BLUE CROSS/BLUE SHIELD

## 2017-12-20 ENCOUNTER — Other Ambulatory Visit: Payer: BLUE CROSS/BLUE SHIELD

## 2017-12-27 ENCOUNTER — Ambulatory Visit: Payer: BLUE CROSS/BLUE SHIELD

## 2017-12-27 ENCOUNTER — Other Ambulatory Visit: Payer: Self-pay | Admitting: Adult Health

## 2017-12-27 DIAGNOSIS — Z8571 Personal history of Hodgkin lymphoma: Secondary | ICD-10-CM

## 2017-12-28 ENCOUNTER — Inpatient Hospital Stay (HOSPITAL_BASED_OUTPATIENT_CLINIC_OR_DEPARTMENT_OTHER): Payer: BLUE CROSS/BLUE SHIELD | Admitting: Adult Health

## 2017-12-28 ENCOUNTER — Inpatient Hospital Stay: Payer: BLUE CROSS/BLUE SHIELD | Attending: Adult Health

## 2017-12-28 ENCOUNTER — Other Ambulatory Visit: Payer: Self-pay | Admitting: Adult Health

## 2017-12-28 ENCOUNTER — Encounter: Payer: Self-pay | Admitting: Adult Health

## 2017-12-28 ENCOUNTER — Telehealth: Payer: Self-pay | Admitting: Adult Health

## 2017-12-28 VITALS — BP 137/80 | HR 66 | Temp 98.4°F | Resp 18 | Ht 72.0 in | Wt 276.9 lb

## 2017-12-28 DIAGNOSIS — Z8571 Personal history of Hodgkin lymphoma: Secondary | ICD-10-CM

## 2017-12-28 LAB — CBC WITH DIFFERENTIAL (CANCER CENTER ONLY)
BASOS ABS: 0 10*3/uL (ref 0.0–0.1)
Basophils Relative: 0 %
EOS ABS: 0 10*3/uL (ref 0.0–0.5)
EOS PCT: 1 %
HCT: 43.6 % (ref 38.4–49.9)
HEMOGLOBIN: 14.6 g/dL (ref 13.0–17.1)
LYMPHS ABS: 3.2 10*3/uL (ref 0.9–3.3)
LYMPHS PCT: 47 %
MCH: 30.3 pg (ref 27.2–33.4)
MCHC: 33.5 g/dL (ref 32.0–36.0)
MCV: 90.5 fL (ref 79.3–98.0)
Monocytes Absolute: 0.5 10*3/uL (ref 0.1–0.9)
Monocytes Relative: 7 %
NEUTROS PCT: 45 %
Neutro Abs: 3.1 10*3/uL (ref 1.5–6.5)
PLATELETS: 228 10*3/uL (ref 140–400)
RBC: 4.81 MIL/uL (ref 4.20–5.82)
RDW: 13.1 % (ref 11.0–14.6)
WBC: 6.8 10*3/uL (ref 4.0–10.3)

## 2017-12-28 LAB — CMP (CANCER CENTER ONLY)
ALT: 52 U/L (ref 0–55)
AST: 35 U/L — ABNORMAL HIGH (ref 5–34)
Albumin: 4.6 g/dL (ref 3.5–5.0)
Alkaline Phosphatase: 38 U/L — ABNORMAL LOW (ref 40–150)
Anion gap: 9 (ref 3–11)
BILIRUBIN TOTAL: 0.3 mg/dL (ref 0.2–1.2)
BUN: 18 mg/dL (ref 7–26)
CHLORIDE: 104 mmol/L (ref 98–109)
CO2: 28 mmol/L (ref 22–29)
CREATININE: 1.31 mg/dL — AB (ref 0.70–1.30)
Calcium: 9.4 mg/dL (ref 8.4–10.4)
Glucose, Bld: 95 mg/dL (ref 70–140)
Potassium: 4.2 mmol/L (ref 3.5–5.1)
Sodium: 141 mmol/L (ref 136–145)
TOTAL PROTEIN: 7.4 g/dL (ref 6.4–8.3)

## 2017-12-28 LAB — LACTATE DEHYDROGENASE: LDH: 161 U/L (ref 125–245)

## 2017-12-28 NOTE — Progress Notes (Signed)
CLINIC:  Survivorship   REASON FOR VISIT:  Routine follow-up for history of lymphoma.   BRIEF ONCOLOGIC HISTORY:  Jon Shannon was diagnosed with classical Hodgkin lymphoma, stage IIA in 2013 after presenting initially with palpable lymphadenopathy in his neck. The patient was treated with several cycles of ABVD from April 2013 to August 2013. Subsequently he underwent consolidation radiation treatment. His PET/CT scan from November 2014 showed no evidence of disease recurrence.   INTERVAL HISTORY:  Jon Shannon presents to the Survivorship Clinic today for routine follow-up for his history of lymphoma.  Overall, he reports feeling quite well. He is exercising regularly.  He denies any issues such as fevers, chills, nausea, night sweats, unintentional weight loss or any other concerns.     REVIEW OF SYSTEMS:  Review of Systems  Constitutional: Negative for appetite change, chills, fatigue, fever and unexpected weight change.  HENT:   Negative for hearing loss, lump/mass and trouble swallowing.   Eyes: Negative for eye problems and icterus.  Respiratory: Negative for chest tightness, cough and shortness of breath.   Cardiovascular: Negative for chest pain, leg swelling and palpitations.  Gastrointestinal: Negative for abdominal distention and abdominal pain.  Endocrine: Negative for hot flashes.  Genitourinary: Negative for difficulty urinating.   Musculoskeletal: Negative for arthralgias.  Skin: Negative for itching and rash.  Neurological: Negative for dizziness, extremity weakness, headaches and numbness.  Hematological: Negative for adenopathy. Does not bruise/bleed easily.  Psychiatric/Behavioral: Negative for depression. The patient is not nervous/anxious.       PAST MEDICAL/SURGICAL HISTORY:  Past Medical History:  Diagnosis Date  . DJD (degenerative joint disease)   . Elevated serum creatinine 06/15/2014  . Hepatitis A    resolved.   Marland Kitchen HIV INFECTION 11/30/2009   no  history of opportunistic infection  . Hodgkin's lymphoma ([place]) 11/09/2011  . Hx of radiation therapy 04/03/12 -04/29/12   Hodgkin's disease -neck  . Hyperlipidemia 08/07/2011  . HYPERTENSION 11/30/2009  . Hypertriglyceridemia   . PTSD (post-traumatic stress disorder)   . Rectal bleeding 09/04/2013  . Weight loss 05/29/2013   Past Surgical History:  Procedure Laterality Date  . left cervical node excisional biopsy  10/2011  . left index finger trauma repair    . Powerport    . TONSILLECTOMY     as teenager     ALLERGIES:  Allergies  Allergen Reactions  . Shrimp [Shellfish Allergy] Anaphylaxis    Lobster, too  . Cefdinir Other (See Comments)    Oral ulcer, mild.      CURRENT MEDICATIONS:  Outpatient Encounter Medications as of 12/28/2017  Medication Sig Note  . abacavir-dolutegravir-lamiVUDine (TRIUMEQ) 600-50-300 MG tablet Take 1 tablet by mouth daily with breakfast.   . ACCU-CHEK AVIVA PLUS test strip  12/03/2013: Received from: External Pharmacy  . aspirin 81 MG tablet Take 81 mg by mouth daily.   . B Complex-C (B-COMPLEX WITH VITAMIN C) tablet Take 1 tablet by mouth daily.   . bisoprolol-hydrochlorothiazide (ZIAC) 5-6.25 MG tablet Take 1 tablet by mouth daily.   Gretta Arab 450 MG CAPS Take 450 capsules by mouth every morning.   . Cholecalciferol (VITAMIN D3) 1000 UNITS tablet Take 1,000 Units by mouth daily. Reported on 11/05/2015   . Chromium Picolinate (CHROMIUM PICOLATE PO) Take 1 tablet by mouth daily.   Marland Kitchen CINNAMON PO Take 1 capsule by mouth daily.   Marland Kitchen DHEA 10 MG CAPS Take by mouth. Reported on 11/05/2015 06/15/2014: Ran out  . diphenhydrAMINE (BENADRYL) 25 MG tablet Take 25  mg by mouth at bedtime as needed. For sleep   . escitalopram (LEXAPRO) 20 MG tablet    . famotidine (PEPCID AC) 10 MG chewable tablet Chew 10 mg by mouth 2 (two) times daily. 12/03/2013: Prn only  . fenofibrate 160 MG tablet Take 160 mg by mouth daily.   . fish oil-omega-3 fatty acids 1000 MG capsule Take 1 g by  mouth 2 (two) times daily. Two capsules BID   . GARLIC OIL PO Take 1 tablet by mouth daily.   Claudine Mouton [phone] MG TABS  11/05/2014: Received from: External Pharmacy  . lisinopril (PRINIVIL,ZESTRIL) 5 MG tablet Take 1 tablet (5 mg total) by mouth daily.   Marland Kitchen loratadine (CLARITIN) 10 MG tablet Take 10 mg by mouth daily.   11/05/2014: 11/05/14 takes 20 mg  . metoprolol succinate (TOPROL-XL) 50 MG 24 hr tablet    . pravastatin (PRAVACHOL) 20 MG tablet    . Probiotic Product (PROBIOTIC DAILY PO) Take by mouth daily.   . vitamin C (ASCORBIC ACID) 500 MG tablet Take 1,000 mg by mouth daily.    No facility-administered encounter medications on file as of 12/28/2017.      ONCOLOGIC FAMILY HISTORY:  Family History  Problem Relation Age of Onset  . Cancer Mother 62       breast cancer   . Seizures Mother   . Hypertension Mother   . Hyperlipidemia Mother   . Diabetes Father   . Hypertension Father   . Hyperlipidemia Father   . Cancer Father        brain tumor (unknown type)  . Cancer Maternal Aunt        lung cancer  . Cancer Maternal Grandmother        CLL      SOCIAL HISTORY:  Social History   Socioeconomic History  . Marital status: Single    Spouse name: Not on file  . Number of children: 0  . Years of education: 53  . Highest education level: Not on file  Occupational History  . Occupation: Job Pharmacist, community: [employer]  . Financial resource strain: Not on file  . Food insecurity:    Worry: Not on file    Inability: Not on file  . Transportation needs:    Medical: Not on file    Non-medical: Not on file  Tobacco Use  . Smoking status: Former Smoker    Packs/day: 1.00    Types: Cigarettes    Last attempt to quit: 07/01/2011    Years since quitting: 6.4  . Smokeless tobacco: Never Used  Substance and Sexual Activity  . Alcohol use: No    Alcohol/week: 0.0 oz  . Drug use: No  . Sexual activity: Not Currently    Comment: pt. declined condoms    Lifestyle  . Physical activity:    Days per week: Not on file    Minutes per session: Not on file  . Stress: Not on file  Relationships  . Social connections:    Talks on phone: Not on file    Gets together: Not on file    Attends religious service: Not on file    Active member of club or organization: Not on file    Attends meetings of clubs or organizations: Not on file    Relationship status: Not on file  . Intimate partner violence:    Fear of current or ex partner: Not on file    Emotionally abused:  Not on file    Physically abused: Not on file    Forced sexual activity: Not on file  Other Topics Concern  . Not on file  Social History Narrative  . Not on file      PHYSICAL EXAMINATION:  Vital Signs: Vitals:   12/28/17 1450  BP: 137/80  Pulse: 66  Resp: 18  Temp: 98.4 F (36.9 C)  SpO2: 98%   Filed Weights   12/28/17 1450  Weight: 276 lb 14.4 oz (125.6 kg)   General: Well-nourished, well-appearing male in no acute distress. Unaccompanied today. HEENT: Head is normocephalic.  Pupils equal and reactive to light. Conjunctivae clear without exudate.  Sclerae anicteric. Oral mucosa is pink, moist.  Oropharynx is pink without lesions or erythema.  Lymph: No cervical, supraclavicular, infraclavicular, or axillary lymphadenopathy noted on palpation.  Cardiovascular: Regular rate and rhythm.Marland Kitchen Respiratory: Clear to auscultation bilaterally. Chest expansion symmetric; breathing non-labored.  GI: Abdomen soft and round; non-tender, non-distended. Bowel sounds normoactive. No hepatosplenomegaly.   GU: Deferred.  Neuro: No focal deficits. Steady gait.  Psych: Mood and affect normal and appropriate for situation.  Extremities: No edema. Skin: Warm and dry.   LABORATORY DATA:  Appointment on 12/28/2017  Component Date Value Ref Range Status  . Sodium 12/28/2017 141  136 - 145 mmol/L Final  . Potassium 12/28/2017 4.2  3.5 - 5.1 mmol/L Final  . Chloride 12/28/2017 104   98 - 109 mmol/L Final  . CO2 12/28/2017 28  22 - 29 mmol/L Final  . Glucose, Bld 12/28/2017 95  70 - 140 mg/dL Final  . BUN 12/28/2017 18  7 - 26 mg/dL Final  . Creatinine 12/28/2017 1.31* 0.70 - 1.30 mg/dL Final  . Calcium 12/28/2017 9.4  8.4 - 10.4 mg/dL Final  . Total Protein 12/28/2017 7.4  6.4 - 8.3 g/dL Final  . Albumin 12/28/2017 4.6  3.5 - 5.0 g/dL Final  . AST 12/28/2017 35* 5 - 34 U/L Final  . ALT 12/28/2017 52  0 - 55 U/L Final  . Alkaline Phosphatase 12/28/2017 38* 40 - 150 U/L Final  . Total Bilirubin 12/28/2017 0.3  0.2 - 1.2 mg/dL Final  . GFR, Est Non Af Am 12/28/2017 >60  >60 mL/min Final  . GFR, Est AFR Am 12/28/2017 >60  >60 mL/min Final   Comment: (NOTE) The eGFR has been calculated using the CKD EPI equation. This calculation has not been validated in all clinical situations. eGFR's persistently <60 mL/min signify possible Chronic Kidney Disease.   Georgiann Hahn gap 12/28/2017 9  3 - 11 Final   Performed at [facility], [place] [address], [place], [place] [address]  . WBC Count 12/28/2017 6.8  4.0 - 10.3 K/uL Final  . RBC 12/28/2017 4.81  4.20 - 5.82 MIL/uL Final  . Hemoglobin 12/28/2017 14.6  13.0 - 17.1 g/dL Final  . HCT 12/28/2017 43.6  38.4 - 49.9 % Final  . MCV 12/28/2017 90.5  79.3 - 98.0 fL Final  . MCH 12/28/2017 30.3  27.2 - 33.4 pg Final  . MCHC 12/28/2017 33.5  32.0 - 36.0 g/dL Final  . RDW 12/28/2017 13.1  11.0 - 14.6 % Final  . Platelet Count 12/28/2017 228  140 - 400 K/uL Final  . Neutrophils Relative % 12/28/2017 45  % Final  . Neutro Abs 12/28/2017 3.1  1.5 - 6.5 K/uL Final  . Lymphocytes Relative 12/28/2017 47  % Final  . Lymphs Abs 12/28/2017 3.2  0.9 - 3.3 K/uL  Final  . Monocytes Relative 12/28/2017 7  % Final  . Monocytes Absolute 12/28/2017 0.5  0.1 - 0.9 K/uL Final  . Eosinophils Relative 12/28/2017 1  % Final  . Eosinophils Absolute 12/28/2017 0.0  0.0 - 0.5 K/uL Final  . Basophils Relative 12/28/2017 0  % Final  .  Basophils Absolute 12/28/2017 0.0  0.0 - 0.1 K/uL Final   Performed at [facility], [place] [address] [place], [place] [address]    DIAGNOSTIC IMAGING:  None for this visit     ASSESSMENT AND PLAN:  Mr.. Shannon is a pleasant 52 y.o. male with history of lymphoma, diagnosed in 2013; treated with chemotherapy and radiation. Jon Shannon presents to the Survivorship Clinic for surveillance and routine follow-up.   1. History of lymphoma:  Jon Shannon is currently clinically and radiographically without evidence of disease or recurrence of lymphoma. He will follow-up in the Survivorship Clinic in 1 year with labs, history, and physical exam per surveillance protocol.  I encouraged him to call me with any questions or concerns before his next visit at the cancer center, and I would be happy to see the patient sooner, if needed.    2. Cancer screening:  Due to Jon Shannon's history and age, he should receive screening for skin cancers, prostate cancer, colon cancer. The patient was encouraged to follow-up with his PCP for appropriate cancer screenings.   3. Health maintenance and wellness promotion: Jon Shannon was encouraged to consume 5-7 servings of fruits and vegetables per day. The patient was also encouraged to engage in moderate to vigorous exercise for 30 minutes per day most days of the week. Jon Shannon was instructed to limit her alcohol consumption and continue to abstain from tobacco use.     Dispo:  -Return to cancer center to see Survivorship NP in one year   A total of (20) minutes of face-to-face time was spent with this patient with greater than 50% of that time in counseling and care-coordination.   Jon Bihari, NP Survivorship Program [place] [phone]   Note: PRIMARY CARE PROVIDER [name], [place] [phone]

## 2017-12-28 NOTE — Telephone Encounter (Signed)
Patient declined avs and calendar of upcoming may 2020 appointments.

## 2018-01-01 DIAGNOSIS — F411 Generalized anxiety disorder: Secondary | ICD-10-CM | POA: Diagnosis not present

## 2018-01-03 ENCOUNTER — Ambulatory Visit: Payer: BLUE CROSS/BLUE SHIELD

## 2018-01-08 DIAGNOSIS — F411 Generalized anxiety disorder: Secondary | ICD-10-CM | POA: Diagnosis not present

## 2018-01-10 ENCOUNTER — Ambulatory Visit: Payer: BLUE CROSS/BLUE SHIELD

## 2018-01-15 DIAGNOSIS — F411 Generalized anxiety disorder: Secondary | ICD-10-CM | POA: Diagnosis not present

## 2018-01-17 ENCOUNTER — Ambulatory Visit: Payer: BLUE CROSS/BLUE SHIELD

## 2018-01-22 DIAGNOSIS — F411 Generalized anxiety disorder: Secondary | ICD-10-CM | POA: Diagnosis not present

## 2018-01-24 ENCOUNTER — Ambulatory Visit: Payer: BLUE CROSS/BLUE SHIELD

## 2018-01-29 DIAGNOSIS — F411 Generalized anxiety disorder: Secondary | ICD-10-CM | POA: Diagnosis not present

## 2018-01-31 ENCOUNTER — Ambulatory Visit: Payer: BLUE CROSS/BLUE SHIELD

## 2018-02-06 DIAGNOSIS — L03129 Acute lymphangitis of unspecified part of limb: Secondary | ICD-10-CM | POA: Diagnosis not present

## 2018-02-06 DIAGNOSIS — M79661 Pain in right lower leg: Secondary | ICD-10-CM | POA: Diagnosis not present

## 2018-02-06 DIAGNOSIS — Z6837 Body mass index (BMI) 37.0-37.9, adult: Secondary | ICD-10-CM | POA: Diagnosis not present

## 2018-02-06 DIAGNOSIS — M7989 Other specified soft tissue disorders: Secondary | ICD-10-CM | POA: Diagnosis not present

## 2018-02-07 ENCOUNTER — Ambulatory Visit: Payer: BLUE CROSS/BLUE SHIELD

## 2018-02-07 DIAGNOSIS — M79661 Pain in right lower leg: Secondary | ICD-10-CM | POA: Diagnosis not present

## 2018-02-07 DIAGNOSIS — M7989 Other specified soft tissue disorders: Secondary | ICD-10-CM | POA: Diagnosis not present

## 2018-02-07 DIAGNOSIS — M79662 Pain in left lower leg: Secondary | ICD-10-CM | POA: Diagnosis not present

## 2018-02-14 ENCOUNTER — Ambulatory Visit: Payer: BLUE CROSS/BLUE SHIELD

## 2018-02-19 DIAGNOSIS — F411 Generalized anxiety disorder: Secondary | ICD-10-CM | POA: Diagnosis not present

## 2018-02-21 ENCOUNTER — Ambulatory Visit: Payer: BLUE CROSS/BLUE SHIELD

## 2018-02-21 DIAGNOSIS — F341 Dysthymic disorder: Secondary | ICD-10-CM

## 2018-02-26 DIAGNOSIS — F411 Generalized anxiety disorder: Secondary | ICD-10-CM | POA: Diagnosis not present

## 2018-02-28 ENCOUNTER — Ambulatory Visit: Payer: BLUE CROSS/BLUE SHIELD

## 2018-03-05 DIAGNOSIS — F411 Generalized anxiety disorder: Secondary | ICD-10-CM | POA: Diagnosis not present

## 2018-03-07 ENCOUNTER — Ambulatory Visit: Payer: BLUE CROSS/BLUE SHIELD

## 2018-03-07 ENCOUNTER — Encounter: Payer: Self-pay | Admitting: Infectious Diseases

## 2018-03-13 DIAGNOSIS — F411 Generalized anxiety disorder: Secondary | ICD-10-CM | POA: Diagnosis not present

## 2018-03-14 ENCOUNTER — Ambulatory Visit: Payer: BLUE CROSS/BLUE SHIELD

## 2018-03-21 ENCOUNTER — Ambulatory Visit: Payer: BLUE CROSS/BLUE SHIELD

## 2018-03-21 NOTE — Progress Notes (Unsigned)
Integrated Behavioral Health Follow Up Visit  MRN: [id number] Name: Jon Shannon   Session Start time: 11 Session End time:12 Total time:1 hour Type of Service: [place] SUBJECTIVE: Jon Shannon is a 52 y.o. male presenting to the [facility] for outpatient mental health individual therapy to address symptoms of depression and anxiety.  OBJECTIVE: Mood: Jon Shannon was alert, oriented x4, with no SI, HI, or symptoms of psychosis (risk low).  Jon Shannon was pleasant and friendly, engaging openly and appropriately with clinician.  Jon Shannon's affect was appropriate and congruent with mood.  Jon Shannon became tearful a few times during session.   GOALS ADDRESSED: Patient will: 1.  Reduce symptoms of: depression, anxiety, low self-esteem 2.  Increase knowledge and/or ability [name] negative automatic thinking and statements. 3.  Demonstrate ability to: develop stronger self-esteem and self worth.  INTERVENTIONS: Interventions utilized:  Supportive listening, reframing, solution focused therapy.   ASSESSMENT: Jon Shannon is currently experiencing feelings of self-loathing and continues to share that he feels that he is a burden to his parents.  Client was resistant to reframing, but tolerated it.  Jon Shannon uses strong negative self-talk and believes that he can never prove himself worthy to his parents.  Jon Shannon what he believes he could do to gain approval from his parents.  Jon Shannon is unable to express a way to do this without using fantasy type scenarios where he "saves a child's life" or some other heroic effort.  Jon encouraged Jon Shannon to think of the ways he helps his parents and that any pressure he feels is his own, and not brought on by anyone else.   Jon Shannon was encouraged to identify and challenge negative thought patterns. PLAN: 1. Follow up with behavioral health clinician on : 03/28/18  Jon Better, LCSW

## 2018-03-26 DIAGNOSIS — F411 Generalized anxiety disorder: Secondary | ICD-10-CM | POA: Diagnosis not present

## 2018-03-28 ENCOUNTER — Ambulatory Visit: Payer: BLUE CROSS/BLUE SHIELD

## 2018-04-03 DIAGNOSIS — F411 Generalized anxiety disorder: Secondary | ICD-10-CM | POA: Diagnosis not present

## 2018-04-04 ENCOUNTER — Ambulatory Visit: Payer: BLUE CROSS/BLUE SHIELD

## 2018-04-09 DIAGNOSIS — F411 Generalized anxiety disorder: Secondary | ICD-10-CM | POA: Diagnosis not present

## 2018-04-11 ENCOUNTER — Ambulatory Visit: Payer: BLUE CROSS/BLUE SHIELD

## 2018-04-16 DIAGNOSIS — F411 Generalized anxiety disorder: Secondary | ICD-10-CM | POA: Diagnosis not present

## 2018-04-18 ENCOUNTER — Ambulatory Visit: Payer: BLUE CROSS/BLUE SHIELD

## 2018-04-24 DIAGNOSIS — F411 Generalized anxiety disorder: Secondary | ICD-10-CM | POA: Diagnosis not present

## 2018-04-25 ENCOUNTER — Ambulatory Visit: Payer: BLUE CROSS/BLUE SHIELD

## 2018-04-30 DIAGNOSIS — F411 Generalized anxiety disorder: Secondary | ICD-10-CM | POA: Diagnosis not present

## 2018-05-02 ENCOUNTER — Ambulatory Visit: Payer: BLUE CROSS/BLUE SHIELD

## 2018-05-09 ENCOUNTER — Ambulatory Visit: Payer: BLUE CROSS/BLUE SHIELD

## 2018-05-14 DIAGNOSIS — F411 Generalized anxiety disorder: Secondary | ICD-10-CM | POA: Diagnosis not present

## 2018-05-16 ENCOUNTER — Ambulatory Visit (INDEPENDENT_AMBULATORY_CARE_PROVIDER_SITE_OTHER): Payer: BLUE CROSS/BLUE SHIELD | Admitting: *Deleted

## 2018-05-16 ENCOUNTER — Ambulatory Visit: Payer: BLUE CROSS/BLUE SHIELD

## 2018-05-16 DIAGNOSIS — Z23 Encounter for immunization: Secondary | ICD-10-CM

## 2018-05-16 NOTE — BH Specialist Note (Signed)
Integrated Behavioral Health Follow Up Visit  MRN: [id number] Name: Danial Alamo  Session Start time: 11:00  Session End time: 12:00  Type of Service: Integrated Behavioral Health- Individual/Family   SUBJECTIVE: Hezikiah Retzloff is a 52 y.o. male presenting for outpatient mental health therapy.  OBJECTIVE: Client presented to therapy with depressed mood. Client's speech and affect were generally appropriate and client engaged openly with therapist.  .    Risk of harm to self or others: None noted, risk low.  INTERVENTIONS: Interventions utilized:  Solution focused therapy, CBT Reframing, supportive listening.  Therapist provided [facility]-INTX to include ongoing assessment, support, and reinforcement.    ASSESSMENT: Patient currently experiencing intense feelings of depression and feelings of hopelessness. Marbin reports that he is extremely worried about his upcoming disability hearing, and that he tends to "always" fear that the worst is going to happen and that his thoughts are often negative in nature. Client shared with therapist that he is not benefiting from EMDR (received  in the community):  Client and therapist explored the idea that if he is not benefiting from this therapy, it is his choice to stop taking part in it.   Coe discussed his "safe place" from childhood and his feeling that utilizing this as a coping skill is "hiding" from his feelings rather than as a healthy coping skill,   Plez struggles with feelings of anger and his perceived inability to handle anger.  Laderrick reports a feeling of self-loathing, particularly around his feelings of anger.  Discussion focused on ways for him to release his anger in healthy physical ways rather than feeling as though he wants to hurt himself.    Marland Kitchen  PLAN: 1. Follow up with behavioral health clinician on 05/30/18.   [name], LCSW

## 2018-05-17 NOTE — Telephone Encounter (Signed)
Gave patient flu shot after his counseling appointment. He would like to know if he is due for pneumonia and if Dr Johnnye Sima would prescribe something for sleep (perhaps trazodone?).  He has a primary care doctor, but would like Dr Johnnye Sima to start him on something. [name], RN

## 2018-05-20 DIAGNOSIS — F411 Generalized anxiety disorder: Secondary | ICD-10-CM | POA: Diagnosis not present

## 2018-05-23 ENCOUNTER — Ambulatory Visit: Payer: BLUE CROSS/BLUE SHIELD

## 2018-05-28 DIAGNOSIS — F411 Generalized anxiety disorder: Secondary | ICD-10-CM | POA: Diagnosis not present

## 2018-05-30 ENCOUNTER — Ambulatory Visit: Payer: BLUE CROSS/BLUE SHIELD

## 2018-05-30 NOTE — BH Specialist Note (Signed)
ERROR

## 2018-06-04 DIAGNOSIS — F411 Generalized anxiety disorder: Secondary | ICD-10-CM | POA: Diagnosis not present

## 2018-06-05 ENCOUNTER — Other Ambulatory Visit: Payer: BLUE CROSS/BLUE SHIELD

## 2018-06-05 DIAGNOSIS — Z113 Encounter for screening for infections with a predominantly sexual mode of transmission: Secondary | ICD-10-CM

## 2018-06-05 DIAGNOSIS — B20 Human immunodeficiency virus [HIV] disease: Secondary | ICD-10-CM | POA: Diagnosis not present

## 2018-06-05 DIAGNOSIS — Z79899 Other long term (current) drug therapy: Secondary | ICD-10-CM | POA: Diagnosis not present

## 2018-06-06 ENCOUNTER — Ambulatory Visit: Payer: BLUE CROSS/BLUE SHIELD

## 2018-06-06 DIAGNOSIS — E782 Mixed hyperlipidemia: Secondary | ICD-10-CM | POA: Diagnosis not present

## 2018-06-06 DIAGNOSIS — G4700 Insomnia, unspecified: Secondary | ICD-10-CM | POA: Diagnosis not present

## 2018-06-06 DIAGNOSIS — E1169 Type 2 diabetes mellitus with other specified complication: Secondary | ICD-10-CM | POA: Diagnosis not present

## 2018-06-06 DIAGNOSIS — R1012 Left upper quadrant pain: Secondary | ICD-10-CM | POA: Diagnosis not present

## 2018-06-06 LAB — T-HELPER CELL (CD4) - (RCID CLINIC ONLY)
CD4 % Helper T Cell: 27 % — ABNORMAL LOW (ref 33–55)
CD4 T Cell Abs: 650 /uL (ref 400–2700)

## 2018-06-07 LAB — COMPREHENSIVE METABOLIC PANEL
AG Ratio: 2 (calc) (ref 1.0–2.5)
ALBUMIN MSPROF: 4.5 g/dL (ref 3.6–5.1)
ALKALINE PHOSPHATASE (APISO): 36 U/L — AB (ref 40–115)
ALT: 36 U/L (ref 9–46)
AST: 25 U/L (ref 10–35)
BUN: 18 mg/dL (ref 7–25)
CHLORIDE: 105 mmol/L (ref 98–110)
CO2: 28 mmol/L (ref 20–32)
Calcium: 9.3 mg/dL (ref 8.6–10.3)
Creat: 1.03 mg/dL (ref 0.70–1.33)
GLOBULIN: 2.2 g/dL (ref 1.9–3.7)
Glucose, Bld: 129 mg/dL — ABNORMAL HIGH (ref 65–99)
POTASSIUM: 4.3 mmol/L (ref 3.5–5.3)
SODIUM: 141 mmol/L (ref 135–146)
Total Bilirubin: 0.3 mg/dL (ref 0.2–1.2)
Total Protein: 6.7 g/dL (ref 6.1–8.1)

## 2018-06-07 LAB — LIPID PANEL
Cholesterol: 150 mg/dL (ref <200)
HDL: 26 mg/dL — AB (ref >40)
Non-HDL Cholesterol (Calc): 124 mg/dL (calc) (ref <130)
Total CHOL/HDL Ratio: 5.8 (calc) — ABNORMAL HIGH (ref <5.0)
Triglycerides: 479 mg/dL — ABNORMAL HIGH (ref <150)

## 2018-06-07 LAB — CBC
HCT: 43.6 % (ref 38.5–50.0)
HEMOGLOBIN: 14.7 g/dL (ref 13.2–17.1)
MCH: 29.8 pg (ref 27.0–33.0)
MCHC: 33.7 g/dL (ref 32.0–36.0)
MCV: 88.3 fL (ref 80.0–100.0)
MPV: 9.8 fL (ref 7.5–12.5)
Platelets: 217 10*3/uL (ref 140–400)
RBC: 4.94 10*6/uL (ref 4.20–5.80)
RDW: 12.7 % (ref 11.0–15.0)
WBC: 5.7 10*3/uL (ref 3.8–10.8)

## 2018-06-07 LAB — HIV-1 RNA QUANT-NO REFLEX-BLD
HIV 1 RNA QUANT: NOT DETECTED {copies}/mL
HIV-1 RNA Quant, Log: <1.3 Log copies/mL

## 2018-06-07 LAB — RPR: RPR Ser Ql: NONREACTIVE

## 2018-06-13 ENCOUNTER — Ambulatory Visit: Payer: BLUE CROSS/BLUE SHIELD

## 2018-06-13 NOTE — BH Specialist Note (Unsigned)
Integrated Behavioral Health Follow Up Visit  MRN: [id number] Name: Ivory Bail  Number of [place] Clinician visits:  Session Start time: 11:00 Session End time: 12:00 Total time: {IBH Total Time:21014050}  Type of Service: [place] Interpretor:{yes [id number]} Interpretor Name and Language: ***  SUBJECTIVE: Ezrael Forget is a 52 y.o. male accompanied by self.  Patient reports the following symptoms/concerns: Efrem reports feelings of self-loathing, intense depression.   Duration of problem: ***; Severity of problem: {Mild/Moderate/Severe:20260}  OBJECTIVE: Mood: {BHH MOOD:22306} and Affect: {BHH AFFECT:22307} Risk of harm to self or others: {CHL AMB BH Suicide Current Mental Status:21022748}  LIFE CONTEXT: Family and Social: *** School/Work: *** Self-Care: *** Life Changes: ***  GOALS ADDRESSED: Patient will: 1.  Reduce symptoms of: {IBH Symptoms:21014056}  2.  Increase knowledge and/or ability of: {IBH Patient Tools:21014057}  3.  Demonstrate ability to: {IBH Goals:21014053}  INTERVENTIONS: Interventions utilized:  {IBH Interventions:21014054} Standardized Assessments completed: {IBH Screening Tools:21014051}  ASSESSMENT: Patient currently experiencing ***.   Patient may benefit from ***.  PLAN: 1. Follow up with behavioral health clinician on : *** 2. Behavioral recommendations: *** 3. Referral(s): {IBH Referrals:21014055} 4. "From scale of 1-10, how likely are you to follow plan?": ***  [name], LCSW

## 2018-06-19 ENCOUNTER — Encounter: Payer: Self-pay | Admitting: Infectious Diseases

## 2018-06-19 ENCOUNTER — Ambulatory Visit (INDEPENDENT_AMBULATORY_CARE_PROVIDER_SITE_OTHER): Payer: BLUE CROSS/BLUE SHIELD | Admitting: Infectious Diseases

## 2018-06-19 VITALS — BP 133/82 | HR 63 | Temp 98.6°F | Wt 278.0 lb

## 2018-06-19 DIAGNOSIS — B20 Human immunodeficiency virus [HIV] disease: Secondary | ICD-10-CM | POA: Diagnosis not present

## 2018-06-19 DIAGNOSIS — G4709 Other insomnia: Secondary | ICD-10-CM

## 2018-06-19 DIAGNOSIS — I10 Essential (primary) hypertension: Secondary | ICD-10-CM | POA: Diagnosis not present

## 2018-06-19 DIAGNOSIS — R109 Unspecified abdominal pain: Secondary | ICD-10-CM | POA: Insufficient documentation

## 2018-06-19 DIAGNOSIS — E1121 Type 2 diabetes mellitus with diabetic nephropathy: Secondary | ICD-10-CM

## 2018-06-19 DIAGNOSIS — R1013 Epigastric pain: Secondary | ICD-10-CM

## 2018-06-19 DIAGNOSIS — Z79899 Other long term (current) drug therapy: Secondary | ICD-10-CM

## 2018-06-19 DIAGNOSIS — F341 Dysthymic disorder: Secondary | ICD-10-CM

## 2018-06-19 DIAGNOSIS — Z113 Encounter for screening for infections with a predominantly sexual mode of transmission: Secondary | ICD-10-CM

## 2018-06-19 NOTE — Assessment & Plan Note (Addendum)
Appreciate f/u with Joylene Draft at length His mood seems good.

## 2018-06-19 NOTE — Assessment & Plan Note (Addendum)
Has meter now No lesions (skin break down) on feet. Encouraged him to f/u with podiatry for calluses, nails.  He has one small blood spot on his L mid foot. No blister or skin defect.  Encouraged him to f/u with his eye doctor as he has not had f/u this year.  appreciate pcp f/u.

## 2018-06-19 NOTE — Assessment & Plan Note (Signed)
Encourage diet control and exercise for wt loss.

## 2018-06-19 NOTE — Assessment & Plan Note (Signed)
Fairly well controlled today  Will f/u with PCP.

## 2018-06-19 NOTE — Assessment & Plan Note (Addendum)
Doing well Slight drop in CD4  Has gotten flus shot PCV today Offered/refused condoms. Not active.  Discussed U=U rtc in 9 months

## 2018-06-19 NOTE — Assessment & Plan Note (Signed)
Appreciate PCP f/u Consider imaging if persists? Agree with idea this is gas related.

## 2018-06-19 NOTE — Assessment & Plan Note (Signed)
Trazodone refilled by his PCP

## 2018-06-19 NOTE — Progress Notes (Signed)
   Subjective:    Patient ID: Jon Shannon, male    DOB: 07-Apr-1966, 52 y.o.   MRN: [id number]  HPI 52yo M with HIV+, previous stage IIa Hodgkin's lymphoma (2013), tx with ABVD 10-2011 with resultant neuropathy. He had onc f/u May 2019, clear. Has DM as well from steroids. Severe hypertriglyceridemia(Trig 479 on 05-2018). Has PCP in [place] Chancy Milroy, J).  Taking 4g fish oil, fenofibrate, and pravastatin. On triumeq.  Was not checking FSG as he did not have a meter or strips. He has since gotten these. His last A1C was 5.8%.  Working on disability (has met with attorney and judge). Multiple stressors- family issues, significant depression. Has seen Marcie Bal last week after he got his labs back.  Has been having abd pain radiating from back, worse after eating.  Saw PCP- intestinal pain. He has been on fibercon, which he quit and pain has persisted. Unchanged after passing gas (either end).  He has had flu shot, not PCV.  Wt steady.  Has gotten trazodone from his PCP.   HIV 1 RNA Quant (copies/mL)  Date Value  06/05/2018 <20 NOT DETECTED  08/22/2017 <20 NOT DETECTED  02/19/2017 <20 DETECTED (A)   CD4 T Cell Abs (/uL)  Date Value  06/05/2018 650  08/22/2017 810  02/19/2017 710    Review of Systems  Constitutional: Negative for appetite change and unexpected weight change.  Gastrointestinal: Positive for abdominal pain and diarrhea. Negative for constipation.  Genitourinary: Negative for difficulty urinating.  Neurological: Positive for numbness.  Psychiatric/Behavioral: Positive for dysphoric mood and sleep disturbance.  had pad on his feet split open.      Objective:   Physical Exam  Constitutional: He is oriented to person, place, and time. He appears well-developed and well-nourished.  HENT:  Mouth/Throat: No oropharyngeal exudate.  Eyes: Pupils are equal, round, and reactive to light. EOM are normal.  Neck: Normal range of motion. Neck supple.  Cardiovascular: Normal  rate, regular rhythm and normal heart sounds.  Pulmonary/Chest: Effort normal and breath sounds normal.  Abdominal: Soft. Bowel sounds are normal. He exhibits no distension. There is no tenderness.  Lymphadenopathy:    He has no cervical adenopathy.  Neurological: He is alert and oriented to person, place, and time. A sensory deficit is present.  Skin: Skin is warm and dry.  Psychiatric: He has a normal mood and affect.   Diabetic Foot Exam - Simple   No data filed         Assessment & Plan:

## 2018-06-21 DIAGNOSIS — F411 Generalized anxiety disorder: Secondary | ICD-10-CM | POA: Diagnosis not present

## 2018-06-27 ENCOUNTER — Ambulatory Visit: Payer: BLUE CROSS/BLUE SHIELD

## 2018-07-04 ENCOUNTER — Ambulatory Visit: Payer: BLUE CROSS/BLUE SHIELD

## 2018-07-11 ENCOUNTER — Ambulatory Visit: Payer: BLUE CROSS/BLUE SHIELD

## 2018-07-25 ENCOUNTER — Ambulatory Visit: Payer: BLUE CROSS/BLUE SHIELD

## 2018-07-25 NOTE — BH Specialist Note (Signed)
Patient ID: Jon Shannon, male   DOB: [birth year], 51 y.o.   MRN: [id number]  INTERVENTION: Clinician provided outpatient mental health counseling to include ongoing support, and reinforcement.  Clinician provided patient with supportive listening, cognitive reframing, and exploration of feelings.  Clinician and patient explored patient's feelings related to the holidays and spending time with extended family.  Session focused on maintaining healthy boundaries as patient begins to develop some independence from his immediate family.  INTERVENTION EFFECTIVENESS: Patient was alert, oriented x4 with no SI,HI, or symptoms of psychosis (risk low).  Patient was pleasant and friendly; engaging openly and appropriately with therapist.  Patient benefited from exploration of feelings and supportive listening.  Patient tolerated cognitive re-framing, but continues to struggle with low self-worth. Next session recommneded in two weeks.  Jessica Priest, LCSW

## 2018-08-08 ENCOUNTER — Ambulatory Visit: Payer: BLUE CROSS/BLUE SHIELD

## 2018-08-22 ENCOUNTER — Ambulatory Visit: Payer: BLUE CROSS/BLUE SHIELD

## 2018-09-03 DIAGNOSIS — E119 Type 2 diabetes mellitus without complications: Secondary | ICD-10-CM | POA: Diagnosis not present

## 2018-09-03 LAB — HM DIABETES EYE EXAM

## 2018-09-04 ENCOUNTER — Other Ambulatory Visit: Payer: Self-pay | Admitting: Infectious Diseases

## 2018-09-04 DIAGNOSIS — C8190 Hodgkin lymphoma, unspecified, unspecified site: Secondary | ICD-10-CM

## 2018-09-05 ENCOUNTER — Ambulatory Visit: Payer: BLUE CROSS/BLUE SHIELD

## 2018-09-05 DIAGNOSIS — F341 Dysthymic disorder: Secondary | ICD-10-CM

## 2018-09-12 DIAGNOSIS — Z6837 Body mass index (BMI) 37.0-37.9, adult: Secondary | ICD-10-CM | POA: Diagnosis not present

## 2018-09-12 DIAGNOSIS — L97512 Non-pressure chronic ulcer of other part of right foot with fat layer exposed: Secondary | ICD-10-CM | POA: Diagnosis not present

## 2018-09-12 NOTE — BH Specialist Note (Signed)
Goal: Client will increase coping skills for life stressors. Intervention: Therapist met with client for [facility]-IT to include ongoing assessment, support, and reinforcement. Therapist allowed client to "check-in" since previous session; asking client to share any positive coping skills used over the previous week, along with any challenges faced. Therapist used person centered, solution focused, and cognitive behavioral strategies in session today. Therapist and client reviewed and explored his use of coping skills and natural supports. Effectiveness: Client was alert, oriented X3, with no SI, HI, or symptoms of psychosis (low risk). Client was pleasant and friendly, engaging openly and appropriately with therapist, benefitting from supportive listening exploring of feelings. Client reported getting a better sense of self-esteem out of therapy. Client reported having a better outlook and thought process in life situations. Client reported feeling a better sense of self since coming to sessions. Intervention was deemed effective as client was able to articulate what he has gained from therapy in each session.   Next session recommended in two weeks.   Jessica Priest, LCSW

## 2018-09-19 ENCOUNTER — Ambulatory Visit: Payer: BLUE CROSS/BLUE SHIELD

## 2018-09-26 ENCOUNTER — Ambulatory Visit: Payer: BLUE CROSS/BLUE SHIELD

## 2018-09-26 DIAGNOSIS — Z6838 Body mass index (BMI) 38.0-38.9, adult: Secondary | ICD-10-CM | POA: Diagnosis not present

## 2018-09-26 DIAGNOSIS — F609 Personality disorder, unspecified: Secondary | ICD-10-CM | POA: Diagnosis not present

## 2018-09-26 DIAGNOSIS — L97512 Non-pressure chronic ulcer of other part of right foot with fat layer exposed: Secondary | ICD-10-CM | POA: Diagnosis not present

## 2018-10-10 ENCOUNTER — Encounter: Payer: Self-pay | Admitting: Infectious Diseases

## 2018-10-10 ENCOUNTER — Ambulatory Visit: Payer: BLUE CROSS/BLUE SHIELD

## 2018-10-10 ENCOUNTER — Other Ambulatory Visit: Payer: Self-pay

## 2018-10-10 DIAGNOSIS — F609 Personality disorder, unspecified: Secondary | ICD-10-CM | POA: Diagnosis not present

## 2018-10-15 DIAGNOSIS — L97512 Non-pressure chronic ulcer of other part of right foot with fat layer exposed: Secondary | ICD-10-CM | POA: Diagnosis not present

## 2018-10-15 DIAGNOSIS — Z6836 Body mass index (BMI) 36.0-36.9, adult: Secondary | ICD-10-CM | POA: Diagnosis not present

## 2018-10-17 DIAGNOSIS — E1152 Type 2 diabetes mellitus with diabetic peripheral angiopathy with gangrene: Secondary | ICD-10-CM | POA: Diagnosis not present

## 2018-10-17 DIAGNOSIS — F609 Personality disorder, unspecified: Secondary | ICD-10-CM | POA: Diagnosis not present

## 2018-10-17 DIAGNOSIS — L089 Local infection of the skin and subcutaneous tissue, unspecified: Secondary | ICD-10-CM | POA: Diagnosis not present

## 2018-10-17 DIAGNOSIS — Z21 Asymptomatic human immunodeficiency virus [HIV] infection status: Secondary | ICD-10-CM | POA: Diagnosis not present

## 2018-10-17 DIAGNOSIS — E11621 Type 2 diabetes mellitus with foot ulcer: Secondary | ICD-10-CM | POA: Diagnosis not present

## 2018-10-17 DIAGNOSIS — I96 Gangrene, not elsewhere classified: Secondary | ICD-10-CM | POA: Diagnosis not present

## 2018-10-17 DIAGNOSIS — I10 Essential (primary) hypertension: Secondary | ICD-10-CM | POA: Diagnosis not present

## 2018-10-17 DIAGNOSIS — L97512 Non-pressure chronic ulcer of other part of right foot with fat layer exposed: Secondary | ICD-10-CM | POA: Diagnosis not present

## 2018-10-24 ENCOUNTER — Other Ambulatory Visit: Payer: Self-pay

## 2018-10-24 ENCOUNTER — Ambulatory Visit: Payer: BLUE CROSS/BLUE SHIELD

## 2018-10-24 DIAGNOSIS — L97512 Non-pressure chronic ulcer of other part of right foot with fat layer exposed: Secondary | ICD-10-CM | POA: Diagnosis not present

## 2018-10-24 DIAGNOSIS — Z21 Asymptomatic human immunodeficiency virus [HIV] infection status: Secondary | ICD-10-CM | POA: Diagnosis not present

## 2018-10-24 DIAGNOSIS — E11621 Type 2 diabetes mellitus with foot ulcer: Secondary | ICD-10-CM | POA: Diagnosis not present

## 2018-10-24 DIAGNOSIS — I10 Essential (primary) hypertension: Secondary | ICD-10-CM | POA: Diagnosis not present

## 2018-10-30 DIAGNOSIS — E11621 Type 2 diabetes mellitus with foot ulcer: Secondary | ICD-10-CM | POA: Diagnosis not present

## 2018-10-30 DIAGNOSIS — L03031 Cellulitis of right toe: Secondary | ICD-10-CM | POA: Diagnosis not present

## 2018-10-30 DIAGNOSIS — I10 Essential (primary) hypertension: Secondary | ICD-10-CM | POA: Diagnosis not present

## 2018-10-30 DIAGNOSIS — L97512 Non-pressure chronic ulcer of other part of right foot with fat layer exposed: Secondary | ICD-10-CM | POA: Diagnosis not present

## 2018-10-30 DIAGNOSIS — L97519 Non-pressure chronic ulcer of other part of right foot with unspecified severity: Secondary | ICD-10-CM | POA: Diagnosis not present

## 2018-10-30 DIAGNOSIS — Z21 Asymptomatic human immunodeficiency virus [HIV] infection status: Secondary | ICD-10-CM | POA: Diagnosis not present

## 2018-10-30 DIAGNOSIS — R936 Abnormal findings on diagnostic imaging of limbs: Secondary | ICD-10-CM | POA: Diagnosis not present

## 2018-11-06 DIAGNOSIS — L97512 Non-pressure chronic ulcer of other part of right foot with fat layer exposed: Secondary | ICD-10-CM | POA: Diagnosis not present

## 2018-11-06 DIAGNOSIS — E11621 Type 2 diabetes mellitus with foot ulcer: Secondary | ICD-10-CM | POA: Diagnosis not present

## 2018-11-06 DIAGNOSIS — Z21 Asymptomatic human immunodeficiency virus [HIV] infection status: Secondary | ICD-10-CM | POA: Diagnosis not present

## 2018-11-07 ENCOUNTER — Ambulatory Visit: Payer: BLUE CROSS/BLUE SHIELD

## 2018-11-07 DIAGNOSIS — L03031 Cellulitis of right toe: Secondary | ICD-10-CM | POA: Diagnosis not present

## 2018-11-07 DIAGNOSIS — E11621 Type 2 diabetes mellitus with foot ulcer: Secondary | ICD-10-CM | POA: Diagnosis not present

## 2018-11-07 DIAGNOSIS — F609 Personality disorder, unspecified: Secondary | ICD-10-CM | POA: Diagnosis not present

## 2018-11-13 DIAGNOSIS — M86171 Other acute osteomyelitis, right ankle and foot: Secondary | ICD-10-CM | POA: Diagnosis not present

## 2018-11-13 DIAGNOSIS — E11621 Type 2 diabetes mellitus with foot ulcer: Secondary | ICD-10-CM | POA: Diagnosis not present

## 2018-11-13 DIAGNOSIS — M869 Osteomyelitis, unspecified: Secondary | ICD-10-CM | POA: Diagnosis not present

## 2018-11-13 DIAGNOSIS — L97512 Non-pressure chronic ulcer of other part of right foot with fat layer exposed: Secondary | ICD-10-CM | POA: Diagnosis not present

## 2018-11-13 DIAGNOSIS — Z21 Asymptomatic human immunodeficiency virus [HIV] infection status: Secondary | ICD-10-CM | POA: Diagnosis not present

## 2018-11-14 ENCOUNTER — Other Ambulatory Visit: Payer: Self-pay

## 2018-11-14 ENCOUNTER — Ambulatory Visit: Payer: BLUE CROSS/BLUE SHIELD

## 2018-11-14 DIAGNOSIS — F609 Personality disorder, unspecified: Secondary | ICD-10-CM | POA: Diagnosis not present

## 2018-11-20 DIAGNOSIS — E11621 Type 2 diabetes mellitus with foot ulcer: Secondary | ICD-10-CM | POA: Diagnosis not present

## 2018-11-20 DIAGNOSIS — I10 Essential (primary) hypertension: Secondary | ICD-10-CM | POA: Diagnosis not present

## 2018-11-20 DIAGNOSIS — L97512 Non-pressure chronic ulcer of other part of right foot with fat layer exposed: Secondary | ICD-10-CM | POA: Diagnosis not present

## 2018-11-20 DIAGNOSIS — B20 Human immunodeficiency virus [HIV] disease: Secondary | ICD-10-CM | POA: Diagnosis not present

## 2018-11-21 ENCOUNTER — Other Ambulatory Visit: Payer: Self-pay

## 2018-11-21 ENCOUNTER — Ambulatory Visit: Payer: BLUE CROSS/BLUE SHIELD

## 2018-11-21 DIAGNOSIS — F609 Personality disorder, unspecified: Secondary | ICD-10-CM | POA: Diagnosis not present

## 2018-11-27 DIAGNOSIS — B20 Human immunodeficiency virus [HIV] disease: Secondary | ICD-10-CM | POA: Diagnosis not present

## 2018-11-27 DIAGNOSIS — L97512 Non-pressure chronic ulcer of other part of right foot with fat layer exposed: Secondary | ICD-10-CM | POA: Diagnosis not present

## 2018-11-27 DIAGNOSIS — E11621 Type 2 diabetes mellitus with foot ulcer: Secondary | ICD-10-CM | POA: Diagnosis not present

## 2018-12-04 DIAGNOSIS — L97512 Non-pressure chronic ulcer of other part of right foot with fat layer exposed: Secondary | ICD-10-CM | POA: Diagnosis not present

## 2018-12-04 DIAGNOSIS — E11621 Type 2 diabetes mellitus with foot ulcer: Secondary | ICD-10-CM | POA: Diagnosis not present

## 2018-12-04 DIAGNOSIS — B20 Human immunodeficiency virus [HIV] disease: Secondary | ICD-10-CM | POA: Diagnosis not present

## 2018-12-05 ENCOUNTER — Telehealth: Payer: Self-pay | Admitting: Adult Health

## 2018-12-05 ENCOUNTER — Other Ambulatory Visit: Payer: Self-pay

## 2018-12-05 ENCOUNTER — Ambulatory Visit: Payer: BLUE CROSS/BLUE SHIELD

## 2018-12-05 NOTE — Telephone Encounter (Signed)
Spoke with pt and he agreed to [employer] for 12/09/18. Emailed step-by-step instructions how to download and access [employer].

## 2018-12-09 ENCOUNTER — Inpatient Hospital Stay: Payer: BLUE CROSS/BLUE SHIELD | Attending: Adult Health | Admitting: Adult Health

## 2018-12-09 ENCOUNTER — Encounter: Payer: Self-pay | Admitting: Adult Health

## 2018-12-09 DIAGNOSIS — Z806 Family history of leukemia: Secondary | ICD-10-CM

## 2018-12-09 DIAGNOSIS — Z8571 Personal history of Hodgkin lymphoma: Secondary | ICD-10-CM | POA: Diagnosis not present

## 2018-12-09 DIAGNOSIS — Z79899 Other long term (current) drug therapy: Secondary | ICD-10-CM

## 2018-12-09 DIAGNOSIS — B20 Human immunodeficiency virus [HIV] disease: Secondary | ICD-10-CM

## 2018-12-09 DIAGNOSIS — Z803 Family history of malignant neoplasm of breast: Secondary | ICD-10-CM

## 2018-12-09 DIAGNOSIS — Z923 Personal history of irradiation: Secondary | ICD-10-CM

## 2018-12-09 DIAGNOSIS — Z7982 Long term (current) use of aspirin: Secondary | ICD-10-CM

## 2018-12-09 DIAGNOSIS — I10 Essential (primary) hypertension: Secondary | ICD-10-CM

## 2018-12-09 DIAGNOSIS — Z87891 Personal history of nicotine dependence: Secondary | ICD-10-CM

## 2018-12-09 DIAGNOSIS — Z801 Family history of malignant neoplasm of trachea, bronchus and lung: Secondary | ICD-10-CM

## 2018-12-09 NOTE — Progress Notes (Signed)
SURVIVORSHIP VIRTUAL VISIT:  I connected with Jon Shannon on 12/09/18 at  1:00 PM EDT by webex and verified that I am speaking with the correct person using two identifiers.   I discussed the limitations, risks, security and privacy concerns of performing an evaluation and management service by telephone/WEBEX and the availability of in person appointments. I also discussed with the patient that there may be a patient responsible charge related to this service. The patient expressed understanding and agreed to proceed.    REASON FOR VISIT:  Routine follow-up for history of lymphoma.   BRIEF ONCOLOGIC HISTORY:   Jon Shannon was diagnosed with classical Hodgkin lymphoma, stage IIA in 2013 after presenting initially with palpable lymphadenopathy in his neck.The patient was treated with several cycles of ABVD from April 2013 to August 2013. Subsequently he underwent consolidation radiation treatment. His PET/CT scan from November 2014 showed no evidence of disease recurrence.  INTERVAL HISTORY:  [name] presents to the [facility]orship [facility] today for routine follow-up for his history of lymphoma.  Vitaly says things have been up and down mentally and with irritation.  He has had some increased pain in his knees and ankles.  He avoided the gym due to [place].  He returned to the gym and developed an infection that started in his foot and extended to his knee.  He went to wound care.  While he was putting groceries away he dropped a can of Rotel on his foot and broke his right great toe.  He developed osteomyelitis.  He was given PO antibiotics BID.  He has one week left.  He was referred to [facility] for blood draws, chest xray and hyperbaric treatment.  He is having increased issues with his knees and ankles. He is taking Cipro 500mg  po BID.    REVIEW OF SYSTEMS:  Review of Systems  Constitutional: Negative for appetite change, chills, fatigue, fever and unexpected weight change.  HENT:    Negative for hearing loss, lump/mass, mouth sores, sore throat and trouble swallowing.   Eyes: Negative for eye problems and icterus.  Respiratory: Negative for chest tightness, cough and shortness of breath.   Cardiovascular: Negative for chest pain, leg swelling and palpitations.  Gastrointestinal: Negative for abdominal distention, abdominal pain, constipation, diarrhea, nausea and vomiting.  Endocrine: Negative for hot flashes.  Genitourinary: Negative for difficulty urinating.   Musculoskeletal: Negative for arthralgias.  Skin: Negative for itching and rash.  Neurological: Negative for dizziness, extremity weakness, headaches and numbness.  Hematological: Negative for adenopathy. Does not bruise/bleed easily.  Psychiatric/Behavioral: Negative for depression. The patient is not nervous/anxious.          PAST MEDICAL/SURGICAL HISTORY:  Past Medical History:  Diagnosis Date  . DJD (degenerative joint disease)   . Elevated serum creatinine 06/15/2014  . Hepatitis A    resolved.   Marland Kitchen HIV INFECTION 11/30/2009   no history of opportunistic infection  . Hodgkin's lymphoma (Jon Shannon) 11/09/2011  . Hx of radiation therapy 04/03/12 -04/29/12   Hodgkin's disease -neck  . Hyperlipidemia 08/07/2011  . HYPERTENSION 11/30/2009  . Hypertriglyceridemia   . PTSD (post-traumatic stress disorder)   . Rectal bleeding 09/04/2013  . Weight loss 05/29/2013   Past Surgical History:  Procedure Laterality Date  . left cervical node excisional biopsy  10/2011  . left index finger trauma repair    . [place]    . TONSILLECTOMY     as teenager     ALLERGIES:  Allergies  Allergen Reactions  . Shrimp [  Shellfish Allergy] Anaphylaxis    Lobster, too  . Cefdinir Other (See Comments)    Oral ulcer, mild.      CURRENT MEDICATIONS:  Outpatient Encounter Medications as of 12/09/2018  Medication Sig Note  . ACCU-CHEK AVIVA PLUS test strip  12/03/2013: Received from: External Pharmacy  . aspirin 81 MG tablet  Take 81 mg by mouth daily.   . B Complex-C (B-COMPLEX WITH VITAMIN C) tablet Take 1 tablet by mouth daily.   . bisoprolol-hydrochlorothiazide (ZIAC) 5-6.25 MG tablet Take 1 tablet by mouth daily.   Gretta Arab 450 MG CAPS Take 450 capsules by mouth every morning.   . Cholecalciferol (VITAMIN D3) 1000 UNITS tablet Take 1,000 Units by mouth daily. Reported on 11/05/2015   . Chromium Picolinate (CHROMIUM PICOLATE PO) Take 1 tablet by mouth daily.   Marland Kitchen CINNAMON PO Take 1 capsule by mouth daily.   Marland Kitchen DHEA 10 MG CAPS Take by mouth. Reported on 11/05/2015 06/15/2014: Ran out  . diphenhydrAMINE (BENADRYL) 25 MG tablet Take 25 mg by mouth at bedtime as needed. For sleep   . escitalopram (LEXAPRO) 20 MG tablet    . famotidine (PEPCID AC) 10 MG chewable tablet Chew 10 mg by mouth 2 (two) times daily. 12/03/2013: Prn only  . fenofibrate 160 MG tablet Take 160 mg by mouth daily.   . fish oil-omega-3 fatty acids 1000 MG capsule Take 1 g by mouth 2 (two) times daily. Two capsules BID   . GARLIC OIL PO Take 1 tablet by mouth daily.   Claudine Mouton [phone] MG TABS  11/05/2014: Received from: External Pharmacy  . lisinopril (PRINIVIL,ZESTRIL) 5 MG tablet Take 1 tablet (5 mg total) by mouth daily.   Marland Kitchen loratadine (CLARITIN) 10 MG tablet Take 10 mg by mouth daily.   11/05/2014: 11/05/14 takes 20 mg  . metoprolol succinate (TOPROL-XL) 50 MG 24 hr tablet    . pravastatin (PRAVACHOL) 20 MG tablet    . Probiotic Product (PROBIOTIC DAILY PO) Take by mouth daily.   . traZODone (DESYREL) 50 MG tablet TAKE 1-2 TABLETS AT BEDTIME AS NEEDED FOR SLEEP   . TRIUMEQ 600-50-300 MG tablet TAKE 1 TABLET BY MOUTH DAILY WITH BREAKFAST   . vitamin C (ASCORBIC ACID) 500 MG tablet Take 1,000 mg by mouth daily.    No facility-administered encounter medications on file as of 12/09/2018.      ONCOLOGIC FAMILY HISTORY:  Family History  Problem Relation Age of Onset  . Cancer Mother 1       breast cancer   . Seizures Mother   . Hypertension  Mother   . Hyperlipidemia Mother   . Diabetes Father   . Hypertension Father   . Hyperlipidemia Father   . Cancer Father        brain tumor (unknown type)  . Cancer Maternal Aunt        lung cancer  . Cancer Maternal Grandmother        CLL    GENETIC COUNSELING/TESTING: Not at this time  SOCIAL HISTORY:  Single, lives in [place], [place], at home with his parents.   Social History   Socioeconomic History  . Marital status: Single    Spouse name: Not on file  . Number of children: 0  . Years of education: 57  . Highest education level: Not on file  Occupational History  . Occupation: Job Pharmacist, community: [employer]  . Financial resource strain: Not on file  .  Food insecurity:    Worry: Not on file    Inability: Not on file  . Transportation needs:    Medical: Not on file    Non-medical: Not on file  Tobacco Use  . Smoking status: Former Smoker    Packs/day: 1.00    Types: Cigarettes    Last attempt to quit: 07/01/2011    Years since quitting: 7.4  . Smokeless tobacco: Never Used  Substance and Sexual Activity  . Alcohol use: No    Alcohol/week: 0.0 standard drinks  . Drug use: No  . Sexual activity: Not Currently    Comment: pt. declined condoms  Lifestyle  . Physical activity:    Days per week: Not on file    Minutes per session: Not on file  . Stress: Not on file  Relationships  . Social connections:    Talks on phone: Not on file    Gets together: Not on file    Attends religious service: Not on file    Active member of club or organization: Not on file    Attends meetings of clubs or organizations: Not on file    Relationship status: Not on file  . Intimate partner violence:    Fear of current or ex partner: Not on file    Emotionally abused: Not on file    Physically abused: Not on file    Forced sexual activity: Not on file  Other Topics Concern  . Not on file  Social History Narrative  . Not on file      OBJECTIVE:   Patient appears well.  Skin has no rash, or lesion.  He is in no apparent distress.  Breathing is non labored.  Normocephalic.  Neuro is focally intact.  Mood and behavior is normal.    LABORATORY DATA:  None for this visit   DIAGNOSTIC IMAGING:  None for this visit    ASSESSMENT AND PLAN:  Mr.. Chewning is a pleasant 53 y.o. male with history of lymphoma, diagnosed in 2013; treated with chemotherapy and radiation.  He presents to the Survivorship Clinic for surveillance and routine follow-up.   1. History of lymphoma:  Mr. Lienhard is currently clinically and radiographically without evidence of disease or recurrence of lymphoma.   He will follow up in the Survivorship Clinic in 1 year with labs, history and physical exam per protocol.  Due to COVID precautions, I have asked that my colleague Dr. Johnnye Sima get an LDH and TSH at his next appointment in August.  I explained to [place] that patients who are more than 5 years out from their cancer diagnosis and have no acute or urgent issues are not being seen at the cancer center due to [place] restrictions.  He will return to see me in one year for continued surveillance.  I encouraged him to call me with any questions or concerns before her next visit at the cancer center, and I would be happy to see her sooner, if needed.    2. Cancer screening:  Due to Mr. Cadavid's history and her age, she should receive screening for skin cancers, colon cancer, and prostate cancers. She was encouraged to follow-up with her PCP for appropriate cancer screenings.   3. Health maintenance and wellness promotion: Mr. Vieau was encouraged to consume 5-7 servings of fruits and vegetables per day. She was also encouraged to engage in moderate to vigorous exercise for 30 minutes per day most days of the week. She was instructed to limit her  alcohol consumption and continue to abstain from tobacco use.    Follow up instructions:    -Return to cancer center to see  Survivorship NP in one year    The patient was provided an opportunity to ask questions and all were answered. The patient agreed with the plan and demonstrated an understanding of the instructions.   The patient was advised to call back or seek an in-person evaluation if the symptoms worsen or if the condition fails to improve as anticipated.   I provided 30 minutes of face-to-face video visit time during this encounter, and > 50% was spent counseling as documented under my assessment & plan.   Gardenia Phlegm, [place] [phone]   Note: PRIMARY CARE PROVIDER [name], [place] [phone]

## 2018-12-11 ENCOUNTER — Telehealth: Payer: Self-pay | Admitting: Adult Health

## 2018-12-11 DIAGNOSIS — E11621 Type 2 diabetes mellitus with foot ulcer: Secondary | ICD-10-CM | POA: Diagnosis not present

## 2018-12-11 DIAGNOSIS — L97519 Non-pressure chronic ulcer of other part of right foot with unspecified severity: Secondary | ICD-10-CM | POA: Diagnosis not present

## 2018-12-11 NOTE — Telephone Encounter (Signed)
Per 5/11 los I did schedule yr appointment for LTs but for the lab I sent a message to [place]

## 2018-12-12 ENCOUNTER — Ambulatory Visit: Payer: BLUE CROSS/BLUE SHIELD

## 2018-12-12 ENCOUNTER — Other Ambulatory Visit: Payer: Self-pay

## 2018-12-13 ENCOUNTER — Encounter: Payer: BLUE CROSS/BLUE SHIELD | Admitting: Adult Health

## 2018-12-13 ENCOUNTER — Other Ambulatory Visit: Payer: BLUE CROSS/BLUE SHIELD

## 2018-12-19 ENCOUNTER — Encounter: Payer: Self-pay | Admitting: Infectious Diseases

## 2018-12-19 ENCOUNTER — Ambulatory Visit: Payer: Medicaid Other

## 2018-12-19 ENCOUNTER — Other Ambulatory Visit: Payer: Self-pay

## 2018-12-26 ENCOUNTER — Other Ambulatory Visit: Payer: Self-pay

## 2018-12-26 ENCOUNTER — Ambulatory Visit: Payer: Medicaid Other

## 2019-01-02 ENCOUNTER — Ambulatory Visit: Payer: Medicare Other

## 2019-01-09 ENCOUNTER — Other Ambulatory Visit: Payer: Self-pay

## 2019-01-09 ENCOUNTER — Ambulatory Visit: Payer: Medicare Other

## 2019-01-16 ENCOUNTER — Other Ambulatory Visit: Payer: Self-pay

## 2019-01-16 ENCOUNTER — Ambulatory Visit: Payer: Medicare Other

## 2019-01-23 ENCOUNTER — Ambulatory Visit: Payer: Medicare Other

## 2019-01-23 ENCOUNTER — Other Ambulatory Visit: Payer: Self-pay

## 2019-01-30 ENCOUNTER — Ambulatory Visit: Payer: Medicare Other

## 2019-02-06 ENCOUNTER — Encounter: Payer: Self-pay | Admitting: Infectious Diseases

## 2019-02-06 ENCOUNTER — Ambulatory Visit: Payer: Medicare Other

## 2019-02-06 ENCOUNTER — Other Ambulatory Visit: Payer: Self-pay

## 2019-02-13 ENCOUNTER — Ambulatory Visit: Payer: Medicare Other

## 2019-02-20 ENCOUNTER — Other Ambulatory Visit: Payer: Self-pay

## 2019-02-20 ENCOUNTER — Ambulatory Visit: Payer: Medicare Other

## 2019-02-27 ENCOUNTER — Other Ambulatory Visit: Payer: Self-pay

## 2019-02-27 ENCOUNTER — Ambulatory Visit: Payer: Medicare Other

## 2019-03-06 ENCOUNTER — Ambulatory Visit: Payer: Medicare Other

## 2019-03-06 ENCOUNTER — Other Ambulatory Visit: Payer: Self-pay

## 2019-03-13 ENCOUNTER — Ambulatory Visit: Payer: Medicare Other

## 2019-03-13 ENCOUNTER — Other Ambulatory Visit: Payer: Self-pay

## 2019-03-20 ENCOUNTER — Encounter: Payer: Self-pay | Admitting: Adult Health

## 2019-03-20 ENCOUNTER — Other Ambulatory Visit: Payer: Medicare Other

## 2019-03-20 ENCOUNTER — Other Ambulatory Visit: Payer: Self-pay

## 2019-03-20 ENCOUNTER — Other Ambulatory Visit (HOSPITAL_COMMUNITY)
Admission: RE | Admit: 2019-03-20 | Discharge: 2019-03-20 | Disposition: A | Discharge status: Home / Self Care | Payer: Medicare Other | Source: Ambulatory Visit | Attending: Infectious Diseases | Admitting: Infectious Diseases

## 2019-03-20 ENCOUNTER — Ambulatory Visit: Payer: Medicare Other

## 2019-03-20 DIAGNOSIS — Z113 Encounter for screening for infections with a predominantly sexual mode of transmission: Secondary | ICD-10-CM | POA: Diagnosis present

## 2019-03-20 DIAGNOSIS — Z79899 Other long term (current) drug therapy: Secondary | ICD-10-CM

## 2019-03-20 DIAGNOSIS — B20 Human immunodeficiency virus [HIV] disease: Secondary | ICD-10-CM

## 2019-03-21 ENCOUNTER — Other Ambulatory Visit: Payer: Self-pay | Admitting: Infectious Diseases

## 2019-03-21 DIAGNOSIS — C8190 Hodgkin lymphoma, unspecified, unspecified site: Secondary | ICD-10-CM

## 2019-03-21 LAB — T-HELPER CELL (CD4) - (RCID CLINIC ONLY)
CD4 % Helper T Cell: 28 % — ABNORMAL LOW (ref 33–65)
CD4 T Cell Abs: 688 /uL (ref 400–1790)

## 2019-03-21 LAB — URINE CYTOLOGY ANCILLARY ONLY
Chlamydia: NEGATIVE
Neisseria Gonorrhea: NEGATIVE

## 2019-03-25 ENCOUNTER — Other Ambulatory Visit: Payer: Self-pay | Admitting: Adult Health

## 2019-03-25 DIAGNOSIS — Z8571 Personal history of Hodgkin lymphoma: Secondary | ICD-10-CM

## 2019-03-25 LAB — LIPID PANEL
Cholesterol: 162 mg/dL (ref <200)
HDL: 28 mg/dL — ABNORMAL LOW (ref >=40)
Non-HDL Cholesterol (Calc): 134 mg/dL (calc) — ABNORMAL HIGH (ref <130)
Total CHOL/HDL Ratio: 5.8 (calc) — ABNORMAL HIGH (ref <5.0)
Triglycerides: 546 mg/dL — ABNORMAL HIGH (ref <150)

## 2019-03-25 LAB — COMPREHENSIVE METABOLIC PANEL
AG Ratio: 2 (calc) (ref 1.0–2.5)
ALT: 38 U/L (ref 9–46)
AST: 27 U/L (ref 10–35)
Albumin: 4.6 g/dL (ref 3.6–5.1)
Alkaline phosphatase (APISO): 38 U/L (ref 35–144)
BUN: 15 mg/dL (ref 7–25)
CO2: 25 mmol/L (ref 20–32)
Calcium: 10.5 mg/dL — ABNORMAL HIGH (ref 8.6–10.3)
Chloride: 105 mmol/L (ref 98–110)
Creat: 0.99 mg/dL (ref 0.70–1.33)
Globulin: 2.3 g/dL (calc) (ref 1.9–3.7)
Glucose, Bld: 130 mg/dL — ABNORMAL HIGH (ref 65–99)
Potassium: 4.6 mmol/L (ref 3.5–5.3)
Sodium: 140 mmol/L (ref 135–146)
Total Bilirubin: 0.4 mg/dL (ref 0.2–1.2)
Total Protein: 6.9 g/dL (ref 6.1–8.1)

## 2019-03-25 LAB — RPR: RPR Ser Ql: NONREACTIVE

## 2019-03-25 LAB — HIV-1 RNA QUANT-NO REFLEX-BLD
HIV 1 RNA Quant: <20 copies/mL — AB
HIV-1 RNA Quant, Log: <1.3 Log copies/mL — AB

## 2019-03-25 LAB — CBC
HCT: 46.5 % (ref 38.5–50.0)
Hemoglobin: 15.8 g/dL (ref 13.2–17.1)
MCH: 31.1 pg (ref 27.0–33.0)
MCHC: 34 g/dL (ref 32.0–36.0)
MCV: 91.5 fL (ref 80.0–100.0)
MPV: 9.8 fL (ref 7.5–12.5)
Platelets: 246 10*3/uL (ref 140–400)
RBC: 5.08 10*6/uL (ref 4.20–5.80)
RDW: 13.4 % (ref 11.0–15.0)
WBC: 6.7 10*3/uL (ref 3.8–10.8)

## 2019-03-27 ENCOUNTER — Other Ambulatory Visit: Payer: Self-pay

## 2019-03-27 ENCOUNTER — Ambulatory Visit: Payer: Medicare Other

## 2019-03-27 ENCOUNTER — Telehealth: Payer: Self-pay

## 2019-03-27 ENCOUNTER — Inpatient Hospital Stay: Payer: Medicare Other | Attending: Adult Health

## 2019-03-27 DIAGNOSIS — Z8571 Personal history of Hodgkin lymphoma: Secondary | ICD-10-CM | POA: Insufficient documentation

## 2019-03-27 LAB — TSH: TSH: 3.723 u[IU]/mL (ref 0.320–4.118)

## 2019-03-27 LAB — LACTATE DEHYDROGENASE: LDH: 124 U/L (ref 98–192)

## 2019-03-27 NOTE — Telephone Encounter (Signed)
-----   Message from Gardenia Phlegm, NP sent at 03/27/2019 11:42 AM EDT ----- TSH and LDH are good.  Please notify patient.  Thanks. ----- Message ----- From: Buel Ream, Lab In [place] Sent: 03/27/2019  11:08 AM EDT To: Gardenia Phlegm, NP

## 2019-03-27 NOTE — Telephone Encounter (Signed)
Spoke with patient to inform that TSH and LDH labs are good per NP.  Patient voiced understanding with no questions or concerns.

## 2019-04-03 ENCOUNTER — Other Ambulatory Visit: Payer: Self-pay

## 2019-04-03 ENCOUNTER — Ambulatory Visit: Payer: Medicare Other

## 2019-04-04 ENCOUNTER — Encounter: Payer: BLUE CROSS/BLUE SHIELD | Admitting: Infectious Diseases

## 2019-04-07 ENCOUNTER — Encounter: Payer: Self-pay | Admitting: Adult Health

## 2019-04-10 ENCOUNTER — Encounter: Payer: Self-pay | Admitting: Infectious Diseases

## 2019-04-10 ENCOUNTER — Ambulatory Visit (INDEPENDENT_AMBULATORY_CARE_PROVIDER_SITE_OTHER): Payer: Medicare Other | Admitting: Infectious Diseases

## 2019-04-10 ENCOUNTER — Other Ambulatory Visit: Payer: Self-pay

## 2019-04-10 ENCOUNTER — Ambulatory Visit: Payer: Medicare Other

## 2019-04-10 VITALS — BP 124/83 | HR 83

## 2019-04-10 DIAGNOSIS — Z8571 Personal history of Hodgkin lymphoma: Secondary | ICD-10-CM | POA: Diagnosis not present

## 2019-04-10 DIAGNOSIS — Z113 Encounter for screening for infections with a predominantly sexual mode of transmission: Secondary | ICD-10-CM

## 2019-04-10 DIAGNOSIS — E1140 Type 2 diabetes mellitus with diabetic neuropathy, unspecified: Secondary | ICD-10-CM | POA: Diagnosis not present

## 2019-04-10 DIAGNOSIS — B20 Human immunodeficiency virus [HIV] disease: Secondary | ICD-10-CM | POA: Diagnosis not present

## 2019-04-10 DIAGNOSIS — Z23 Encounter for immunization: Secondary | ICD-10-CM | POA: Diagnosis present

## 2019-04-10 DIAGNOSIS — R7989 Other specified abnormal findings of blood chemistry: Secondary | ICD-10-CM | POA: Diagnosis not present

## 2019-04-10 DIAGNOSIS — Z794 Long term (current) use of insulin: Secondary | ICD-10-CM

## 2019-04-10 DIAGNOSIS — E781 Pure hyperglyceridemia: Secondary | ICD-10-CM | POA: Diagnosis not present

## 2019-04-10 NOTE — Progress Notes (Signed)
   Subjective:    Patient ID: Gauge Skluzacek, male    DOB: [birth year], 53 y.o.   MRN: [id number]  HPI 53yo M with HIV+, previous stage IIa Hodgkin's lymphoma (2013), tx with ABVD 10-2011 with resultant neuropathy. He had onc f/u May 2019, clear. Has DM2 as well from steroids. Severe hypertriglyceridemia(Trig546on 03-2019). Has PCP in [place] Chancy Milroy, J).  Taking 4g fish oil, fenofibrate, and pravastatin. On triumeq.  FSG have been good- has record on his  Phone.  He has since gotten these. His last A1C was 5.8%.    Today c/o stress from taking care of his mom who has broken vertebrae after fall. She has sciatica as well. She also now has DVT, on xarelto.   Had ophtho this year.  Numbness in hands and feet. Wears shoes at home "most of the time".   HIV 1 RNA Quant (copies/mL)  Date Value  03/20/2019 <20 DETECTED (A)  06/05/2018 <20 NOT DETECTED  08/22/2017 <20 NOT DETECTED   CD4 T Cell Abs (/uL)  Date Value  03/20/2019 688  06/05/2018 650  08/22/2017 810    Review of Systems  Constitutional: Positive for fatigue. Negative for appetite change and unexpected weight change.  Respiratory: Negative for cough and shortness of breath.   Gastrointestinal: Positive for diarrhea. Negative for constipation.  Genitourinary: Negative for difficulty urinating.  Musculoskeletal: Positive for arthralgias.  Neurological: Positive for numbness and headaches.  Please see HPI. All other systems reviewed and negative.      Objective:   Physical Exam Constitutional:      Appearance: Normal appearance. He is obese.  HENT:     Mouth/Throat:     Mouth: Mucous membranes are moist.     Pharynx: No oropharyngeal exudate.  Eyes:     Extraocular Movements: Extraocular movements intact.     Pupils: Pupils are equal, round, and reactive to light.  Neck:     Musculoskeletal: Normal range of motion and neck supple.  Cardiovascular:     Rate and Rhythm: Normal rate and regular rhythm.   Pulmonary:     Effort: Pulmonary effort is normal.     Breath sounds: Normal breath sounds.  Abdominal:     General: Bowel sounds are normal. There is no distension.     Palpations: Abdomen is soft.     Tenderness: There is no abdominal tenderness.  Musculoskeletal:     Right lower leg: Edema present.     Left lower leg: Edema present.  Neurological:     Mental Status: He is alert.  Psychiatric:        Mood and Affect: Mood normal.           Assessment & Plan:

## 2019-04-10 NOTE — Assessment & Plan Note (Signed)
He continues to f/u with PCP On SSI FSG appears to be < 150 Will check A1C at f/u.

## 2019-04-10 NOTE — Assessment & Plan Note (Signed)
Encouraged to diet and lose wt.

## 2019-04-10 NOTE — Addendum Note (Signed)
Addended by: Reggy Eye on: 04/10/2019 05:26 PM   Modules accepted: Orders

## 2019-04-10 NOTE — Telephone Encounter (Signed)
Spoke with patient to inform him that his TSH is normal.  Explained to patient about normal level and he had no further questions.  Knows to call center with questions/concerns.

## 2019-04-10 NOTE — Assessment & Plan Note (Signed)
Has been seen by Onc. In long term survivors program.

## 2019-04-10 NOTE — Assessment & Plan Note (Signed)
Continues on statin and fish oil.  Appreciate pcp f/u.

## 2019-04-10 NOTE — Assessment & Plan Note (Signed)
Offered/refused condoms.  Flu, PCV and menveo today Is doing well Will see him in 9 months with labs.

## 2019-04-10 NOTE — Assessment & Plan Note (Signed)
Last normal.

## 2019-04-17 ENCOUNTER — Other Ambulatory Visit: Payer: Self-pay

## 2019-04-17 ENCOUNTER — Ambulatory Visit: Payer: Medicare Other

## 2019-04-24 ENCOUNTER — Other Ambulatory Visit: Payer: Self-pay

## 2019-04-24 ENCOUNTER — Ambulatory Visit: Payer: Medicare Other

## 2019-05-01 ENCOUNTER — Other Ambulatory Visit: Payer: Self-pay

## 2019-05-01 ENCOUNTER — Ambulatory Visit: Payer: Medicare Other

## 2019-05-08 ENCOUNTER — Ambulatory Visit: Payer: Medicare Other

## 2019-05-08 ENCOUNTER — Other Ambulatory Visit: Payer: Self-pay

## 2019-05-15 ENCOUNTER — Other Ambulatory Visit: Payer: Self-pay

## 2019-05-15 ENCOUNTER — Ambulatory Visit: Payer: Medicare Other

## 2019-05-22 ENCOUNTER — Other Ambulatory Visit: Payer: Self-pay

## 2019-05-22 ENCOUNTER — Ambulatory Visit: Payer: Medicare Other

## 2019-05-29 ENCOUNTER — Ambulatory Visit: Payer: Medicare Other

## 2019-05-29 ENCOUNTER — Other Ambulatory Visit: Payer: Self-pay

## 2019-06-05 ENCOUNTER — Ambulatory Visit: Payer: Medicare Other

## 2019-06-05 ENCOUNTER — Other Ambulatory Visit: Payer: Self-pay

## 2019-06-12 ENCOUNTER — Ambulatory Visit: Payer: Medicare Other

## 2019-06-12 ENCOUNTER — Other Ambulatory Visit: Payer: Self-pay

## 2019-06-19 ENCOUNTER — Ambulatory Visit: Payer: Medicare Other

## 2019-06-19 ENCOUNTER — Telehealth: Payer: Self-pay

## 2019-07-03 ENCOUNTER — Ambulatory Visit: Payer: Medicare Other

## 2019-07-10 ENCOUNTER — Ambulatory Visit: Payer: Medicare Other

## 2019-07-17 ENCOUNTER — Ambulatory Visit: Payer: Medicare Other

## 2019-08-06 DIAGNOSIS — B079 Viral wart, unspecified: Secondary | ICD-10-CM | POA: Diagnosis not present

## 2019-08-07 ENCOUNTER — Other Ambulatory Visit: Payer: Self-pay

## 2019-08-07 ENCOUNTER — Ambulatory Visit: Payer: Medicare HMO

## 2019-09-10 DIAGNOSIS — E119 Type 2 diabetes mellitus without complications: Secondary | ICD-10-CM | POA: Diagnosis not present

## 2019-09-12 ENCOUNTER — Encounter: Payer: Self-pay | Admitting: Infectious Diseases

## 2019-09-16 ENCOUNTER — Other Ambulatory Visit: Payer: Self-pay | Admitting: Infectious Diseases

## 2019-09-16 DIAGNOSIS — C8190 Hodgkin lymphoma, unspecified, unspecified site: Secondary | ICD-10-CM

## 2019-11-27 DIAGNOSIS — Z6837 Body mass index (BMI) 37.0-37.9, adult: Secondary | ICD-10-CM | POA: Diagnosis not present

## 2019-11-27 DIAGNOSIS — E1169 Type 2 diabetes mellitus with other specified complication: Secondary | ICD-10-CM | POA: Diagnosis not present

## 2019-11-27 DIAGNOSIS — Z21 Asymptomatic human immunodeficiency virus [HIV] infection status: Secondary | ICD-10-CM | POA: Diagnosis not present

## 2019-11-27 DIAGNOSIS — E782 Mixed hyperlipidemia: Secondary | ICD-10-CM | POA: Diagnosis not present

## 2019-11-27 DIAGNOSIS — R519 Headache, unspecified: Secondary | ICD-10-CM | POA: Diagnosis not present

## 2019-11-27 DIAGNOSIS — N529 Male erectile dysfunction, unspecified: Secondary | ICD-10-CM | POA: Diagnosis not present

## 2019-11-27 DIAGNOSIS — Z8571 Personal history of Hodgkin lymphoma: Secondary | ICD-10-CM | POA: Diagnosis not present

## 2019-12-02 DIAGNOSIS — E1129 Type 2 diabetes mellitus with other diabetic kidney complication: Secondary | ICD-10-CM | POA: Diagnosis not present

## 2019-12-02 DIAGNOSIS — Z125 Encounter for screening for malignant neoplasm of prostate: Secondary | ICD-10-CM | POA: Diagnosis not present

## 2019-12-02 DIAGNOSIS — Z6837 Body mass index (BMI) 37.0-37.9, adult: Secondary | ICD-10-CM | POA: Diagnosis not present

## 2019-12-02 DIAGNOSIS — Z21 Asymptomatic human immunodeficiency virus [HIV] infection status: Secondary | ICD-10-CM | POA: Diagnosis not present

## 2019-12-02 DIAGNOSIS — F331 Major depressive disorder, recurrent, moderate: Secondary | ICD-10-CM | POA: Diagnosis not present

## 2019-12-02 DIAGNOSIS — R809 Proteinuria, unspecified: Secondary | ICD-10-CM | POA: Diagnosis not present

## 2019-12-02 DIAGNOSIS — E782 Mixed hyperlipidemia: Secondary | ICD-10-CM | POA: Diagnosis not present

## 2019-12-02 DIAGNOSIS — Z79899 Other long term (current) drug therapy: Secondary | ICD-10-CM | POA: Diagnosis not present

## 2019-12-02 DIAGNOSIS — E1169 Type 2 diabetes mellitus with other specified complication: Secondary | ICD-10-CM | POA: Diagnosis not present

## 2019-12-09 ENCOUNTER — Encounter: Payer: Self-pay | Admitting: Adult Health

## 2019-12-09 ENCOUNTER — Inpatient Hospital Stay: Payer: Medicare HMO | Attending: Adult Health | Admitting: Adult Health

## 2019-12-09 ENCOUNTER — Telehealth: Payer: Self-pay | Admitting: Internal Medicine

## 2019-12-09 ENCOUNTER — Other Ambulatory Visit: Payer: Self-pay | Admitting: Infectious Diseases

## 2019-12-09 ENCOUNTER — Other Ambulatory Visit: Payer: Self-pay

## 2019-12-09 VITALS — BP 146/84 | HR 73 | Temp 98.9°F | Resp 18 | Ht 72.0 in | Wt 272.5 lb

## 2019-12-09 DIAGNOSIS — Z79899 Other long term (current) drug therapy: Secondary | ICD-10-CM | POA: Diagnosis not present

## 2019-12-09 DIAGNOSIS — R413 Other amnesia: Secondary | ICD-10-CM | POA: Insufficient documentation

## 2019-12-09 DIAGNOSIS — Z9221 Personal history of antineoplastic chemotherapy: Secondary | ICD-10-CM | POA: Insufficient documentation

## 2019-12-09 DIAGNOSIS — R4789 Other speech disturbances: Secondary | ICD-10-CM

## 2019-12-09 DIAGNOSIS — Z923 Personal history of irradiation: Secondary | ICD-10-CM | POA: Insufficient documentation

## 2019-12-09 DIAGNOSIS — Z8572 Personal history of non-Hodgkin lymphomas: Secondary | ICD-10-CM | POA: Diagnosis not present

## 2019-12-09 DIAGNOSIS — Z8571 Personal history of Hodgkin lymphoma: Secondary | ICD-10-CM

## 2019-12-09 NOTE — Progress Notes (Signed)
SURVIVORSHIP VISIT:   REASON FOR VISIT:  Routine follow-up for history of lymphoma.   BRIEF ONCOLOGIC HISTORY:   Jon Shannon was diagnosed with classical Hodgkin lymphoma, Shannon IIA in 2013 after presenting initially with palpable lymphadenopathy in his neck.The patient was treated with several cycles of ABVD from April 2013 to August 2013. Subsequently he underwent consolidation radiation treatment. His PET/CT scan from November 2014 showed no evidence of disease recurrence.  INTERVAL HISTORY:  Jon Shannon presents to the Survivorship Clinic today for routine follow-up for his history of lymphoma.  Jon Shannon has been doing well.  His main issue has been neuropathy in his feet and he steps on things and will bleed.  He has continued aching in his knees and ankles.  He is eating more mixed nuts and has noted his blood sugar has improved with this.    Jon Shannon is having increased difficulty with remembering things.  He is also having increased speech disfluency.  This has happened previously in 12/2012 and is now worse.  He is exercising regularly.  He is going to the gym 6 days a week.  He notes he is down about 18 pounds.      REVIEW OF SYSTEMS:  Review of Systems  Constitutional: Negative for appetite change, chills, fatigue, fever and unexpected weight change.  HENT:   Negative for hearing loss, lump/mass, mouth sores, sore throat and trouble swallowing.   Eyes: Negative for eye problems and icterus.  Respiratory: Negative for chest tightness, cough and shortness of breath.   Cardiovascular: Negative for chest pain, leg swelling and palpitations.  Gastrointestinal: Negative for abdominal distention, abdominal pain, constipation, diarrhea, nausea and vomiting.  Endocrine: Negative for hot flashes.  Genitourinary: Negative for difficulty urinating.   Musculoskeletal: Negative for arthralgias.  Skin: Negative for itching and rash.  Neurological: Negative for dizziness, extremity weakness,  headaches and numbness.  Hematological: Negative for adenopathy. Does not bruise/bleed easily.  Psychiatric/Behavioral: Negative for depression. The patient is not nervous/anxious.          PAST MEDICAL/SURGICAL HISTORY:  Past Medical History:  Diagnosis Date  . DJD (degenerative joint disease)   . Elevated serum creatinine 06/15/2014  . Hepatitis A    resolved.   Marland Kitchen HIV INFECTION 11/30/2009   no history of opportunistic infection  . Hodgkin's lymphoma ([place]) 11/09/2011  . Hx of radiation therapy 04/03/12 -04/29/12   Hodgkin's disease -neck  . Hyperlipidemia 08/07/2011  . HYPERTENSION 11/30/2009  . Hypertriglyceridemia   . PTSD (post-traumatic stress disorder)   . Rectal bleeding 09/04/2013  . Weight loss 05/29/2013   Past Surgical History:  Procedure Laterality Date  . left cervical node excisional biopsy  10/2011  . left index finger trauma repair    . Powerport    . TONSILLECTOMY     as teenager     ALLERGIES:  Allergies  Allergen Reactions  . Shrimp [Shellfish Allergy] Anaphylaxis    Lobster, too  . Cefdinir Other (See Comments)    Oral ulcer, mild.      CURRENT MEDICATIONS:  Outpatient Encounter Medications as of 12/09/2019  Medication Sig Note  . ACCU-CHEK AVIVA PLUS test strip  12/03/2013: Received from: External Pharmacy  . aspirin 81 MG tablet Take 81 mg by mouth daily.   . B Complex-C (B-COMPLEX WITH VITAMIN C) tablet Take 1 tablet by mouth daily.   . bisoprolol-hydrochlorothiazide (ZIAC) 5-6.25 MG tablet Take 1 tablet by mouth daily.   Gretta Arab 450 MG CAPS Take 450 capsules by mouth every  morning.   . Cholecalciferol (VITAMIN D3) 1000 UNITS tablet Take 1,000 Units by mouth daily. Reported on 11/05/2015   . Chromium Picolinate (CHROMIUM PICOLATE PO) Take 1 tablet by mouth daily.   Marland Kitchen CINNAMON PO Take 1 capsule by mouth daily.   Marland Kitchen DHEA 10 MG CAPS Take by mouth. Reported on 11/05/2015 06/15/2014: Ran out  . diphenhydrAMINE (BENADRYL) 25 MG tablet Take 25 mg by mouth at  bedtime as needed. For sleep   . escitalopram (LEXAPRO) 20 MG tablet    . famotidine (PEPCID AC) 10 MG chewable tablet Chew 10 mg by mouth 2 (two) times daily. 12/03/2013: Prn only  . fenofibrate 160 MG tablet Take 160 mg by mouth daily.   . fish oil-omega-3 fatty acids 1000 MG capsule Take 1 g by mouth 2 (two) times daily. Two capsules BID   . GARLIC OIL PO Take 1 tablet by mouth daily.   Claudine Mouton [id number] MG TABS  11/05/2014: Received from: External Pharmacy  . lisinopril (PRINIVIL,ZESTRIL) 5 MG tablet Take 1 tablet (5 mg total) by mouth daily.   Marland Kitchen loratadine (CLARITIN) 10 MG tablet Take 10 mg by mouth daily.   11/05/2014: 11/05/14 takes 20 mg  . metoprolol succinate (TOPROL-XL) 50 MG 24 hr tablet    . pravastatin (PRAVACHOL) 20 MG tablet    . Probiotic Product (PROBIOTIC DAILY PO) Take by mouth daily.   . traZODone (DESYREL) 50 MG tablet TAKE 1-2 TABLETS AT BEDTIME AS NEEDED FOR SLEEP   . TRIUMEQ 600-50-300 MG tablet TAKE 1 TABLET BY MOUTH DAILY WITH BREAKFAST   . vitamin C (ASCORBIC ACID) 500 MG tablet Take 1,000 mg by mouth daily.    No facility-administered encounter medications on file as of 12/09/2019.     ONCOLOGIC FAMILY HISTORY:  Family History  Problem Relation Age of Onset  . Cancer Mother 41       breast cancer   . Seizures Mother   . Hypertension Mother   . Hyperlipidemia Mother   . Diabetes Father   . Hypertension Father   . Hyperlipidemia Father   . Cancer Father        brain tumor (unknown type)  . Cancer Maternal Aunt        lung cancer  . Cancer Maternal Grandmother        CLL    GENETIC COUNSELING/TESTING: Not at this time  SOCIAL HISTORY:  Single, lives in [place], [place], at home with his parents.   Social History   Socioeconomic History  . Marital status: Single    Spouse name: Not on file  . Number of children: 0  . Years of education: 63  . Highest education level: Not on file  Occupational History  . Occupation: Job Pharmacist, community:  [employer]  Tobacco Use  . Smoking status: Former Smoker    Packs/day: 1.00    Types: Cigarettes    Quit date: 07/01/2011    Years since quitting: 8.4  . Smokeless tobacco: Never Used  Substance and Sexual Activity  . Alcohol use: No    Alcohol/week: 0.0 standard drinks  . Drug use: No  . Sexual activity: Not Currently    Comment: pt. declined condoms  Other Topics Concern  . Not on file  Social History Narrative  . Not on file   Social Determinants of Health   Financial Resource Strain:   . Difficulty of Paying Living Expenses:   Food Insecurity:   . Worried About Running  Out of Food in the Last Year:   . [place] in the Last Year:   Transportation Needs:   . Lack of Transportation (Medical):   Marland Kitchen Lack of Transportation (Non-Medical):   Physical Activity:   . Days of Exercise per Week:   . Minutes of Exercise per Session:   Stress:   . Feeling of Stress :   Social Connections:   . Frequency of Communication with Friends and Family:   . Frequency of Social Gatherings with Friends and Family:   . Attends Religious Services:   . Active Member of Clubs or Organizations:   . Attends Archivist Meetings:   Marland Kitchen Marital Status:   Intimate Partner Violence:   . Fear of Current or Ex-Partner:   . Emotionally Abused:   Marland Kitchen Physically Abused:   . Sexually Abused:       OBJECTIVE:  BP (!) 146/84 (BP Location: Left Arm, Patient Position: Sitting)   Pulse 73   Temp 98.9 F (37.2 C) (Temporal)   Resp 18   Ht 6' (1.829 m)   Wt 272 lb 8 oz (123.6 kg)   SpO2 97%   BMI 36.96 kg/m  GENERAL: Patient is a well appearing male in no acute distress HEENT:  Sclerae anicteric.  Mask in place. Neck is supple.  NODES:  No cervical, supraclavicular, or axillary lymphadenopathy palpated.  LUNGS:  Clear to auscultation bilaterally.  No wheezes or rhonchi. HEART:  Regular rate and rhythm. No murmur appreciated. ABDOMEN:  Soft, nontender.  Positive, normoactive bowel  sounds. No organomegaly palpated. MSK:  No focal spinal tenderness to palpation. 5/5 strength x 4 extremities EXTREMITIES:  No peripheral edema.   SKIN:  Clear with no obvious rashes or skin changes. No nail dyscrasia. NEURO:  Nonfocal. Well oriented.  Appropriate affect.   LABORATORY DATA:  None for this visit   DIAGNOSTIC IMAGING:  None for this visit    ASSESSMENT AND PLAN:  Mr.. Shannon is a pleasant 54 y.o. male with history of lymphoma, diagnosed in 2013; treated with chemotherapy and radiation.  He presents to the Survivorship Clinic for surveillance and routine follow-up.   1. History of lymphoma:  Jon Shannon is currently clinically and radiographically without evidence of disease or recurrence of lymphoma.   He will follow up in the Survivorship Clinic in 1 year with LTS f/u.  I will follow along with his labs completed with Dr. Johnnye Sima.  I encouraged him to call me with any questions or concerns before her next visit at the cancer center, and I would be happy to see her sooner, if needed.    2.  Memory/speech difficulty: I referred him to Dr. Mickeal Skinner for evaluation in neuro oncology.  I question why this hasn't improved and ? Seems worse at today's visit.  3. Cancer screening:  Due to Jon Shannon's history and her age, she should receive screening for skin cancers, colon cancer, and prostate cancers. She was encouraged to follow-up with her PCP for appropriate cancer screenings.   4. Health maintenance and wellness promotion: Jon Shannon was encouraged to consume 5-7 servings of fruits and vegetables per day. She was also encouraged to engage in moderate to vigorous exercise for 30 minutes per day most days of the week. She was instructed to limit her alcohol consumption and continue to abstain from tobacco use.    Follow up instructions:    -Return to cancer center to see Survivorship NP in one year  -referral  to Dr. Mickeal Skinner    The patient was provided an opportunity to  ask questions and all were answered. The patient agreed with the plan and demonstrated an understanding of the instructions.   Total encounter time: 20 minutes*    Gardenia Phlegm, NP [place] [phone]  *Total Encounter Time as defined by the Centers for Medicare and Medicaid Services includes, in addition to the face-to-face time of a patient visit (documented in the note above) non-face-to-face time: obtaining and reviewing outside history, ordering and reviewing medications, tests or procedures, care coordination (communications with other health care professionals or caregivers) and documentation in the medical record.    Note: PRIMARY CARE PROVIDER [name], [place] [phone]

## 2019-12-09 NOTE — Telephone Encounter (Signed)
Received an urgent referral from Wilber Bihari for Jon Shannon to see Dr. Mickeal Skinner for Speech dysfluency and Memory changes. Mr. Bordes has been cld and scheduled to see Dr. Mickeal Skinner on 5/13 at 12pm. Pt aware to arrive 15 minutes early.

## 2019-12-10 ENCOUNTER — Telehealth: Payer: Self-pay | Admitting: Hematology and Oncology

## 2019-12-10 NOTE — Telephone Encounter (Signed)
Scheduled appts per 5/11 los. Pt confirmed appt date and time.  

## 2019-12-11 ENCOUNTER — Other Ambulatory Visit: Payer: Self-pay

## 2019-12-11 ENCOUNTER — Inpatient Hospital Stay: Payer: Medicare HMO | Admitting: Internal Medicine

## 2019-12-11 VITALS — BP 125/74 | HR 71 | Temp 98.3°F | Resp 18 | Ht 73.83 in | Wt 272.8 lb

## 2019-12-11 DIAGNOSIS — Z923 Personal history of irradiation: Secondary | ICD-10-CM | POA: Diagnosis not present

## 2019-12-11 DIAGNOSIS — R4789 Other speech disturbances: Secondary | ICD-10-CM | POA: Diagnosis not present

## 2019-12-11 DIAGNOSIS — Z8572 Personal history of non-Hodgkin lymphomas: Secondary | ICD-10-CM | POA: Diagnosis not present

## 2019-12-11 DIAGNOSIS — Z9221 Personal history of antineoplastic chemotherapy: Secondary | ICD-10-CM | POA: Diagnosis not present

## 2019-12-11 DIAGNOSIS — Z79899 Other long term (current) drug therapy: Secondary | ICD-10-CM | POA: Diagnosis not present

## 2019-12-11 DIAGNOSIS — R4189 Other symptoms and signs involving cognitive functions and awareness: Secondary | ICD-10-CM | POA: Diagnosis not present

## 2019-12-11 DIAGNOSIS — R413 Other amnesia: Secondary | ICD-10-CM | POA: Diagnosis not present

## 2019-12-11 NOTE — Progress Notes (Signed)
[facility] [place], [place] [address] [phone]   Cognitive Survivorship Evaluation  Date of Service: 12/11/19 Patient Name: Jon Shannon Patient MRN: [id number] Patient DOB: [birth year] Provider: Ventura Sellers, MD  Identifying Statement:  Damascus Blot is a 54 y.o. male who presents for initial consultation and evaluation regarding cancer associated cognitive decline.    Referring Provider: [name], MD [place],  [place] [id number]  Primary Cancer: Hodgkin's Lymphoma  History of Present Illness: The patient's records from the referring physician were obtained and reviewed and the patient interviewed to confirm this HPI.  Kavion Reinecke presents today to discuss cognitive changes since beginning treatment for hodgkin's lymphoma.  He underwent chemotherapy with adrimaycin and vinblastine back in 2013, has been stable since that time.  Also carries diagnosis of HIV for which he is on HAART therapy and undetectable viral load. He describes modest impairment in attention, processing speed and short term memory.  There is increased anxiety and mood lability as well.  It is more difficult for him to complete tasks because of cognitive issues.  Otherwise denies focal complaints, no seizures, headaches.  Medications: Current Outpatient Medications on File Prior to Visit  Medication Sig Dispense Refill  . aspirin 81 MG tablet Take 81 mg by mouth 2 (two) times daily.     . B Complex-C (B-COMPLEX WITH VITAMIN C) tablet Take 2 tablets by mouth daily.     . bisoprolol-hydrochlorothiazide (ZIAC) 5-6.25 MG tablet Take 1 tablet by mouth daily.    . Chromium Picolinate (CHROMIUM PICOLATE PO) Take 2 tablets by mouth 2 (two) times daily.     Marland Kitchen CINNAMON PO Take 2 capsules by mouth 2 (two) times daily.     Marland Kitchen escitalopram (LEXAPRO) 20 MG tablet Take 20 mg by mouth daily.   0  . fenofibrate 160 MG tablet Take 160 mg by mouth  daily.    . fish oil-omega-3 fatty acids 1000 MG capsule Take 2 g by mouth 2 (two) times daily. Two capsules BID    . GARLIC OIL PO Take 2 tablets by mouth daily.     . INVOKAMET [phone] MG TABS Take 1 tablet by mouth 2 (two) times daily.     Marland Kitchen lisinopril (PRINIVIL,ZESTRIL) 5 MG tablet Take 1 tablet (5 mg total) by mouth daily.    Marland Kitchen loratadine (CLARITIN) 10 MG tablet Take 20 mg by mouth daily.     . metoprolol succinate (TOPROL-XL) 50 MG 24 hr tablet Take by mouth daily.   0  . pravastatin (PRAVACHOL) 20 MG tablet Take 20 mg by mouth daily.   2  . traZODone (DESYREL) 50 MG tablet TAKE 1-2 TABLETS AT BEDTIME AS NEEDED FOR SLEEP  5  . TRIUMEQ 600-50-300 MG tablet TAKE 1 TABLET BY MOUTH DAILY WITH BREAKFAST 90 tablet 1  . vitamin C (ASCORBIC ACID) 500 MG tablet Take 1,000 mg by mouth 2 (two) times daily.     Marland Kitchen VITAMIN D PO Take 2 tablets by mouth 2 (two) times daily.    . Probiotic Product (PROBIOTIC DAILY PO) Take by mouth daily.     No current facility-administered medications on file prior to visit.    Allergies:  Allergies  Allergen Reactions  . Shrimp [Shellfish Allergy] Anaphylaxis    Lobster, too  . Cefdinir Other (See Comments)    Oral ulcer, mild.    Past Medical History:  Past Medical History:  Diagnosis Date  .  DJD (degenerative joint disease)   . Elevated serum creatinine 06/15/2014  . Hepatitis A    resolved.   Marland Kitchen HIV INFECTION 11/30/2009   no history of opportunistic infection  . Hodgkin's lymphoma ([place]) 11/09/2011  . Hx of radiation therapy 04/03/12 -04/29/12   Hodgkin's disease -neck  . Hyperlipidemia 08/07/2011  . HYPERTENSION 11/30/2009  . Hypertriglyceridemia   . PTSD (post-traumatic stress disorder)   . Rectal bleeding 09/04/2013  . Weight loss 05/29/2013   Past Surgical History:  Past Surgical History:  Procedure Laterality Date  . left cervical node excisional biopsy  10/2011  . left index finger trauma repair    . Powerport    . TONSILLECTOMY     as teenager    Social History:  Social History   Socioeconomic History  . Marital status: Single    Spouse name: Not on file  . Number of children: 0  . Years of education: 58  . Highest education level: Not on file  Occupational History  . Occupation: Job Pharmacist, community: [employer]  Tobacco Use  . Smoking status: Former Smoker    Packs/day: 1.00    Types: Cigarettes    Quit date: 07/01/2011    Years since quitting: 8.4  . Smokeless tobacco: Never Used  Substance and Sexual Activity  . Alcohol use: No    Alcohol/week: 0.0 standard drinks  . Drug use: No  . Sexual activity: Not Currently    Comment: pt. declined condoms  Other Topics Concern  . Not on file  Social History Narrative  . Not on file   Social Determinants of Health   Financial Resource Strain:   . Difficulty of Paying Living Expenses:   Food Insecurity:   . Worried About Charity fundraiser in the Last Year:   . Arboriculturist in the Last Year:   Transportation Needs:   . Film/video editor (Medical):   Marland Kitchen Lack of Transportation (Non-Medical):   Physical Activity:   . Days of Exercise per Week:   . Minutes of Exercise per Session:   Stress:   . Feeling of Stress :   Social Connections:   . Frequency of Communication with Friends and Family:   . Frequency of Social Gatherings with Friends and Family:   . Attends Religious Services:   . Active Member of Clubs or Organizations:   . Attends Archivist Meetings:   Marland Kitchen Marital Status:   Intimate Partner Violence:   . Fear of Current or Ex-Partner:   . Emotionally Abused:   Marland Kitchen Physically Abused:   . Sexually Abused:    Family History:  Family History  Problem Relation Age of Onset  . Cancer Mother 22       breast cancer   . Seizures Mother   . Hypertension Mother   . Hyperlipidemia Mother   . Diabetes Father   . Hypertension Father   . Hyperlipidemia Father   . Cancer Father        brain tumor (unknown type)  . Cancer Maternal Aunt         lung cancer  . Cancer Maternal Grandmother        CLL    Review of Systems: Constitutional: Doesn't report fevers, chills or abnormal weight loss Eyes: Doesn't report blurriness of vision Ears, nose, mouth, throat, and face: Doesn't report sore throat Respiratory: Doesn't report cough, dyspnea or wheezes Cardiovascular: Doesn't report palpitation, chest discomfort  Gastrointestinal:  Doesn't  report nausea, constipation, diarrhea GU: Doesn't report incontinence Skin: Doesn't report skin rashes Neurological: Per HPI Musculoskeletal: Doesn't report joint pain Behavioral/Psych: +anxiety  Physical Exam: Vitals:   12/11/19 1208  BP: 125/74  Pulse: 71  Resp: 18  Temp: 98.3 F (36.8 C)  SpO2: 98%   General: Alert, cooperative, pleasant, in no acute distress Head: Normal EENT: No conjunctival injection or scleral icterus.  Lungs: Resp effort normal Cardiac: Regular rate Abdomen: Non-distended abdomen Skin: No rashes cyanosis or petechiae. Extremities: No clubbing or edema  Neurologic Exam: Mental Status: Awake, alert, attentive to examiner. Oriented to self and environment. Language is fluent with intact comprehension.  Cranial Nerves: Visual acuity is grossly normal. Visual fields are full. Extra-ocular movements intact. No ptosis. Face is symmetric Motor: Tone and bulk are normal. Power is full in both arms and legs.  Sensory: Intact to light touch Gait: Normal.   Labs: I have reviewed the data as listed    Component Value Date/Time   NA 140 03/20/2019 0914   NA 139 12/14/2016 1339   K 4.6 03/20/2019 0914   K 4.2 12/14/2016 1339   CL 105 03/20/2019 0914   CL 100 09/16/2012 1514   CO2 25 03/20/2019 0914   CO2 27 12/14/2016 1339   GLUCOSE 130 (H) 03/20/2019 0914   GLUCOSE 103 12/14/2016 1339   GLUCOSE 116 (H) 09/16/2012 1514   BUN 15 03/20/2019 0914   BUN 12.8 12/14/2016 1339   CREATININE 0.99 03/20/2019 0914   CREATININE 1.1 12/14/2016 1339   CALCIUM 10.5 (H)  03/20/2019 0914   CALCIUM 9.9 12/14/2016 1339   PROT 6.9 03/20/2019 0914   PROT 7.7 12/14/2016 1339   ALBUMIN 4.6 12/28/2017 1412   ALBUMIN 4.6 12/14/2016 1339   AST 27 03/20/2019 0914   AST 35 (H) 12/28/2017 1412   AST 35 (H) 12/14/2016 1339   ALT 38 03/20/2019 0914   ALT 52 12/28/2017 1412   ALT 59 (H) 12/14/2016 1339   ALKPHOS 38 (L) 12/28/2017 1412   ALKPHOS 44 12/14/2016 1339   BILITOT 0.4 03/20/2019 0914   BILITOT 0.3 12/28/2017 1412   BILITOT 0.43 12/14/2016 1339   GFRNONAA >60 12/28/2017 1412   GFRNONAA 68 08/22/2017 1049   GFRAA >60 12/28/2017 1412   GFRAA 79 08/22/2017 1049   Lab Results  Component Value Date   WBC 6.7 03/20/2019   NEUTROABS 3.1 12/28/2017   HGB 15.8 03/20/2019   HCT 46.5 03/20/2019   MCV 91.5 03/20/2019   PLT 246 03/20/2019     Assessment/Plan:  Cognitive Changes  Dyer Malphrus presents with clinical syndrome consistent with mild cognitive decline secondary to downstream effects of cancer and chemotherapy, as well as HIV associatead neurocognitive effects.     We provided counseling regarding healthy behaviors to maintain cognitive function, including exercise, diet, and positive outlook.  We discussed mindful relaxation and provided some methods to redirect anxiety without medication.   No further CNS workup or cognitive screening recommended at this time.  We spent twenty additional minutes teaching regarding the natural history, biology, and historical experience in the treatment of neurologic complications of cancer.   We appreciate the opportunity to participate in the care of Cailean Leitzel.  We encouraged follow up for progressive cognitive issues or neurologic symptoms if they develop.  All questions were answered. The patient knows to call the clinic with any problems, questions or concerns. No barriers to learning were detected.  The total time spent in the encounter was 40 minutes  and more than 50% was on counseling and review  of test results   Ventura Sellers, MD Medical Director of Neuro-Oncology [facility] [place] 12/11/19 2:23 PM

## 2019-12-18 NOTE — Addendum Note (Signed)
Addended by: Dolan Amen D on: 12/18/2019 04:24 PM   Modules accepted: Orders

## 2019-12-23 DIAGNOSIS — S92322A Displaced fracture of second metatarsal bone, left foot, initial encounter for closed fracture: Secondary | ICD-10-CM | POA: Diagnosis not present

## 2019-12-23 DIAGNOSIS — R6889 Other general symptoms and signs: Secondary | ICD-10-CM | POA: Diagnosis not present

## 2019-12-23 DIAGNOSIS — Z0000 Encounter for general adult medical examination without abnormal findings: Secondary | ICD-10-CM | POA: Diagnosis not present

## 2019-12-23 DIAGNOSIS — Z1339 Encounter for screening examination for other mental health and behavioral disorders: Secondary | ICD-10-CM | POA: Diagnosis not present

## 2019-12-23 DIAGNOSIS — S92323A Displaced fracture of second metatarsal bone, unspecified foot, initial encounter for closed fracture: Secondary | ICD-10-CM | POA: Diagnosis not present

## 2019-12-23 DIAGNOSIS — M84475A Pathological fracture, left foot, initial encounter for fracture: Secondary | ICD-10-CM | POA: Diagnosis not present

## 2019-12-23 DIAGNOSIS — Z6836 Body mass index (BMI) 36.0-36.9, adult: Secondary | ICD-10-CM | POA: Diagnosis not present

## 2019-12-25 ENCOUNTER — Other Ambulatory Visit: Payer: Self-pay

## 2019-12-25 ENCOUNTER — Other Ambulatory Visit (HOSPITAL_COMMUNITY)
Admission: RE | Admit: 2019-12-25 | Discharge: 2019-12-25 | Disposition: A | Discharge status: Home / Self Care | Payer: Medicare HMO | Source: Ambulatory Visit | Attending: Infectious Diseases | Admitting: Infectious Diseases

## 2019-12-25 ENCOUNTER — Other Ambulatory Visit: Payer: Medicare HMO

## 2019-12-25 DIAGNOSIS — Z113 Encounter for screening for infections with a predominantly sexual mode of transmission: Secondary | ICD-10-CM | POA: Diagnosis not present

## 2019-12-25 DIAGNOSIS — E781 Pure hyperglyceridemia: Secondary | ICD-10-CM | POA: Diagnosis not present

## 2019-12-25 DIAGNOSIS — Z794 Long term (current) use of insulin: Secondary | ICD-10-CM

## 2019-12-25 DIAGNOSIS — E1140 Type 2 diabetes mellitus with diabetic neuropathy, unspecified: Secondary | ICD-10-CM

## 2019-12-25 DIAGNOSIS — Z8571 Personal history of Hodgkin lymphoma: Secondary | ICD-10-CM | POA: Diagnosis not present

## 2019-12-25 DIAGNOSIS — B20 Human immunodeficiency virus [HIV] disease: Secondary | ICD-10-CM

## 2019-12-25 DIAGNOSIS — M84375A Stress fracture, left foot, initial encounter for fracture: Secondary | ICD-10-CM | POA: Diagnosis not present

## 2019-12-25 DIAGNOSIS — E1142 Type 2 diabetes mellitus with diabetic polyneuropathy: Secondary | ICD-10-CM | POA: Diagnosis not present

## 2019-12-26 ENCOUNTER — Telehealth: Payer: Self-pay

## 2019-12-26 LAB — URINE CYTOLOGY ANCILLARY ONLY
Chlamydia: NEGATIVE
Comment: NEGATIVE
Comment: NORMAL
Neisseria Gonorrhea: NEGATIVE

## 2019-12-26 LAB — T-HELPER CELL (CD4) - (RCID CLINIC ONLY)
CD4 % Helper T Cell: 29 % — ABNORMAL LOW (ref 33–65)
CD4 T Cell Abs: 856 /uL (ref 400–1790)

## 2019-12-26 NOTE — Telephone Encounter (Signed)
TC to pt per Wilber Bihari NP to let him know that his labs are normal. Patient verbalized understanding and did not need labs mailed.patient also reported that he broke his left foot last week. Stated that his PCP did labs and bone density and everything came back normal. Georgia Dom NP aware.

## 2019-12-27 LAB — COMPREHENSIVE METABOLIC PANEL
AG Ratio: 2.2 (calc) (ref 1.0–2.5)
ALT: 28 U/L (ref 9–46)
AST: 21 U/L (ref 10–35)
Albumin: 4.6 g/dL (ref 3.6–5.1)
Alkaline phosphatase (APISO): 46 U/L (ref 35–144)
BUN: 18 mg/dL (ref 7–25)
CO2: 28 mmol/L (ref 20–32)
Calcium: 9.5 mg/dL (ref 8.6–10.3)
Chloride: 104 mmol/L (ref 98–110)
Creat: 1.07 mg/dL (ref 0.70–1.33)
Globulin: 2.1 g/dL (calc) (ref 1.9–3.7)
Glucose, Bld: 123 mg/dL — ABNORMAL HIGH (ref 65–99)
Potassium: 4.5 mmol/L (ref 3.5–5.3)
Sodium: 140 mmol/L (ref 135–146)
Total Bilirubin: 0.4 mg/dL (ref 0.2–1.2)
Total Protein: 6.7 g/dL (ref 6.1–8.1)

## 2019-12-27 LAB — LIPID PANEL
Cholesterol: 145 mg/dL (ref <200)
HDL: 27 mg/dL — ABNORMAL LOW (ref >=40)
Non-HDL Cholesterol (Calc): 118 mg/dL (calc) (ref <130)
Total CHOL/HDL Ratio: 5.4 (calc) — ABNORMAL HIGH (ref <5.0)
Triglycerides: 476 mg/dL — ABNORMAL HIGH (ref <150)

## 2019-12-27 LAB — HEMOGLOBIN A1C
Hgb A1c MFr Bld: 5.4 % of total Hgb (ref <5.7)
Mean Plasma Glucose: 108 (calc)
eAG (mmol/L): 6 (calc)

## 2019-12-27 LAB — CBC
HCT: 43.6 % (ref 38.5–50.0)
Hemoglobin: 14.5 g/dL (ref 13.2–17.1)
MCH: 30.1 pg (ref 27.0–33.0)
MCHC: 33.3 g/dL (ref 32.0–36.0)
MCV: 90.6 fL (ref 80.0–100.0)
MPV: 9.7 fL (ref 7.5–12.5)
Platelets: 252 10*3/uL (ref 140–400)
RBC: 4.81 10*6/uL (ref 4.20–5.80)
RDW: 12.2 % (ref 11.0–15.0)
WBC: 5.9 10*3/uL (ref 3.8–10.8)

## 2019-12-27 LAB — RPR: RPR Ser Ql: NONREACTIVE

## 2019-12-27 LAB — LACTATE DEHYDROGENASE: LDH: 125 U/L (ref 120–250)

## 2019-12-27 LAB — HIV-1 RNA QUANT-NO REFLEX-BLD
HIV 1 RNA Quant: <20 copies/mL
HIV-1 RNA Quant, Log: <1.3 Log copies/mL

## 2019-12-27 LAB — TSH: TSH: 3.79 mIU/L (ref 0.40–4.50)

## 2020-01-06 ENCOUNTER — Ambulatory Visit (INDEPENDENT_AMBULATORY_CARE_PROVIDER_SITE_OTHER): Payer: Medicare HMO | Admitting: Infectious Diseases

## 2020-01-06 ENCOUNTER — Other Ambulatory Visit: Payer: Self-pay

## 2020-01-06 ENCOUNTER — Encounter: Payer: Self-pay | Admitting: Infectious Diseases

## 2020-01-06 VITALS — BP 121/76 | HR 65 | Wt 270.0 lb

## 2020-01-06 DIAGNOSIS — B20 Human immunodeficiency virus [HIV] disease: Secondary | ICD-10-CM | POA: Diagnosis not present

## 2020-01-06 DIAGNOSIS — Z113 Encounter for screening for infections with a predominantly sexual mode of transmission: Secondary | ICD-10-CM | POA: Diagnosis not present

## 2020-01-06 DIAGNOSIS — Z8571 Personal history of Hodgkin lymphoma: Secondary | ICD-10-CM | POA: Diagnosis not present

## 2020-01-06 DIAGNOSIS — E1140 Type 2 diabetes mellitus with diabetic neuropathy, unspecified: Secondary | ICD-10-CM | POA: Diagnosis not present

## 2020-01-06 DIAGNOSIS — I10 Essential (primary) hypertension: Secondary | ICD-10-CM | POA: Diagnosis not present

## 2020-01-06 DIAGNOSIS — Z7984 Long term (current) use of oral hypoglycemic drugs: Secondary | ICD-10-CM

## 2020-01-06 DIAGNOSIS — Z794 Long term (current) use of insulin: Secondary | ICD-10-CM

## 2020-01-06 NOTE — Assessment & Plan Note (Signed)
He has PCP f/u Has seen ophtho Has been fairly well controlled by his records.

## 2020-01-06 NOTE — Assessment & Plan Note (Signed)
Has been going to gym Watching his diet more.

## 2020-01-06 NOTE — Assessment & Plan Note (Signed)
appreciate h/o f/u.  He appears to be doing well.

## 2020-01-06 NOTE — Assessment & Plan Note (Signed)
He is doing well On ACE-I.

## 2020-01-06 NOTE — Assessment & Plan Note (Signed)
He is doing well Offered/refused condoms.  Has gotten COVID vax.  Will continue triumeq rtc in 9 months Dental referral.  Has been going to gym

## 2020-01-06 NOTE — Progress Notes (Signed)
   Subjective:    Patient ID: Jon Shannon, male    DOB: Feb 01, 1966, 54 y.o.   MRN: [id number]  HPI 54yo M with HIV+, previous stage IIa Hodgkin's lymphoma (2013), tx with ABVD 10-2011 with resultant neuropathy. He had onc f/u May 2021, clear. Has DM2 as well from steroids. Severe hypertriglyceridemia(Trig546on 03-2019). Has PCP in [place] Chancy Mil[place], J).  Taking 4g fish oil, fenofibrate, and pravastatin. On triumeq. FSGhave been 98-154 (keeps log in his phone).  His last A1C was 5.8% (not sure of date).  Still helping out taking care of his mom.   Had ophtho this year.  Numbness in hands and feet.  Broke his foot sometime in last 3.5-4 weeks. Not sure how, has in foot brace now.  Has had COVID vax.   HIV 1 RNA Quant (copies/mL)  Date Value  12/25/2019 <20 NOT DETECTED  03/20/2019 <20 DETECTED (A)  06/05/2018 <20 NOT DETECTED   CD4 T Cell Abs (/uL)  Date Value  12/25/2019 856  03/20/2019 688  06/05/2018 650     Review of Systems  Constitutional: Negative for appetite change, chills, fever and unexpected weight change.  Eyes: Positive for visual disturbance.  Gastrointestinal: Negative for constipation and diarrhea.  Genitourinary: Negative for difficulty urinating.  Neurological: Positive for numbness.       Objective:   Physical Exam Vitals reviewed.  Constitutional:      Appearance: Normal appearance. He is obese.  HENT:     Mouth/Throat:     Mouth: Mucous membranes are moist.     Pharynx: No oropharyngeal exudate.  Eyes:     Extraocular Movements: Extraocular movements intact.     Pupils: Pupils are equal, round, and reactive to light.  Cardiovascular:     Rate and Rhythm: Normal rate and regular rhythm.  Pulmonary:     Effort: Pulmonary effort is normal.     Breath sounds: Normal breath sounds.  Abdominal:     General: Bowel sounds are normal.     Palpations: Abdomen is soft.     Tenderness: There is no abdominal tenderness.  Musculoskeletal:       Cervical back: Normal range of motion and neck supple.  Neurological:     Mental Status: He is alert.  Psychiatric:        Mood and Affect: Mood normal.           Assessment & Plan:

## 2020-01-15 DIAGNOSIS — S91311A Laceration without foreign body, right foot, initial encounter: Secondary | ICD-10-CM | POA: Diagnosis not present

## 2020-01-15 DIAGNOSIS — M84375D Stress fracture, left foot, subsequent encounter for fracture with routine healing: Secondary | ICD-10-CM | POA: Diagnosis not present

## 2020-01-15 DIAGNOSIS — E1142 Type 2 diabetes mellitus with diabetic polyneuropathy: Secondary | ICD-10-CM | POA: Diagnosis not present

## 2020-02-17 DIAGNOSIS — E1142 Type 2 diabetes mellitus with diabetic polyneuropathy: Secondary | ICD-10-CM | POA: Diagnosis not present

## 2020-02-17 DIAGNOSIS — M84375D Stress fracture, left foot, subsequent encounter for fracture with routine healing: Secondary | ICD-10-CM | POA: Diagnosis not present

## 2020-03-08 DIAGNOSIS — Z20828 Contact with and (suspected) exposure to other viral communicable diseases: Secondary | ICD-10-CM | POA: Diagnosis not present

## 2020-03-08 DIAGNOSIS — L03115 Cellulitis of right lower limb: Secondary | ICD-10-CM | POA: Diagnosis not present

## 2020-03-12 ENCOUNTER — Other Ambulatory Visit: Payer: Self-pay | Admitting: Infectious Diseases

## 2020-03-12 DIAGNOSIS — C8190 Hodgkin lymphoma, unspecified, unspecified site: Secondary | ICD-10-CM

## 2020-04-01 ENCOUNTER — Encounter: Payer: Self-pay | Admitting: Infectious Diseases

## 2020-05-10 ENCOUNTER — Other Ambulatory Visit: Payer: Self-pay

## 2020-05-10 ENCOUNTER — Ambulatory Visit (INDEPENDENT_AMBULATORY_CARE_PROVIDER_SITE_OTHER): Payer: Medicare HMO

## 2020-05-10 DIAGNOSIS — Z23 Encounter for immunization: Secondary | ICD-10-CM | POA: Diagnosis not present

## 2020-05-11 ENCOUNTER — Ambulatory Visit: Payer: Medicare HMO

## 2020-06-29 DIAGNOSIS — E782 Mixed hyperlipidemia: Secondary | ICD-10-CM | POA: Diagnosis not present

## 2020-06-29 DIAGNOSIS — G620 Drug-induced polyneuropathy: Secondary | ICD-10-CM | POA: Diagnosis not present

## 2020-06-29 DIAGNOSIS — Z6836 Body mass index (BMI) 36.0-36.9, adult: Secondary | ICD-10-CM | POA: Diagnosis not present

## 2020-06-29 DIAGNOSIS — E1169 Type 2 diabetes mellitus with other specified complication: Secondary | ICD-10-CM | POA: Diagnosis not present

## 2020-06-29 DIAGNOSIS — I10 Essential (primary) hypertension: Secondary | ICD-10-CM | POA: Diagnosis not present

## 2020-06-29 DIAGNOSIS — F331 Major depressive disorder, recurrent, moderate: Secondary | ICD-10-CM | POA: Diagnosis not present

## 2020-06-29 DIAGNOSIS — N481 Balanitis: Secondary | ICD-10-CM | POA: Diagnosis not present

## 2020-06-29 DIAGNOSIS — T451X5A Adverse effect of antineoplastic and immunosuppressive drugs, initial encounter: Secondary | ICD-10-CM | POA: Diagnosis not present

## 2020-06-29 DIAGNOSIS — E781 Pure hyperglyceridemia: Secondary | ICD-10-CM | POA: Diagnosis not present

## 2020-08-03 DIAGNOSIS — I889 Nonspecific lymphadenitis, unspecified: Secondary | ICD-10-CM | POA: Diagnosis not present

## 2020-08-03 DIAGNOSIS — M25571 Pain in right ankle and joints of right foot: Secondary | ICD-10-CM | POA: Diagnosis not present

## 2020-08-03 DIAGNOSIS — L03115 Cellulitis of right lower limb: Secondary | ICD-10-CM | POA: Diagnosis not present

## 2020-08-03 DIAGNOSIS — M25572 Pain in left ankle and joints of left foot: Secondary | ICD-10-CM | POA: Diagnosis not present

## 2020-08-03 DIAGNOSIS — Z6835 Body mass index (BMI) 35.0-35.9, adult: Secondary | ICD-10-CM | POA: Diagnosis not present

## 2020-08-03 DIAGNOSIS — S92501A Displaced unspecified fracture of right lesser toe(s), initial encounter for closed fracture: Secondary | ICD-10-CM | POA: Diagnosis not present

## 2020-08-29 DIAGNOSIS — E1169 Type 2 diabetes mellitus with other specified complication: Secondary | ICD-10-CM | POA: Diagnosis not present

## 2020-08-29 DIAGNOSIS — I10 Essential (primary) hypertension: Secondary | ICD-10-CM | POA: Diagnosis not present

## 2020-08-29 DIAGNOSIS — E782 Mixed hyperlipidemia: Secondary | ICD-10-CM | POA: Diagnosis not present

## 2020-09-20 ENCOUNTER — Other Ambulatory Visit: Payer: Self-pay | Admitting: Infectious Diseases

## 2020-09-20 DIAGNOSIS — C8190 Hodgkin lymphoma, unspecified, unspecified site: Secondary | ICD-10-CM

## 2020-09-27 DIAGNOSIS — E1169 Type 2 diabetes mellitus with other specified complication: Secondary | ICD-10-CM | POA: Diagnosis not present

## 2020-09-27 DIAGNOSIS — E782 Mixed hyperlipidemia: Secondary | ICD-10-CM | POA: Diagnosis not present

## 2020-09-27 DIAGNOSIS — I10 Essential (primary) hypertension: Secondary | ICD-10-CM | POA: Diagnosis not present

## 2020-10-04 ENCOUNTER — Other Ambulatory Visit (HOSPITAL_COMMUNITY)
Admission: RE | Admit: 2020-10-04 | Discharge: 2020-10-04 | Disposition: A | Discharge status: Home / Self Care | Payer: Medicare HMO | Source: Ambulatory Visit | Attending: Infectious Diseases | Admitting: Infectious Diseases

## 2020-10-04 ENCOUNTER — Other Ambulatory Visit: Payer: Medicare HMO

## 2020-10-04 ENCOUNTER — Other Ambulatory Visit: Payer: Self-pay

## 2020-10-04 DIAGNOSIS — E1140 Type 2 diabetes mellitus with diabetic neuropathy, unspecified: Secondary | ICD-10-CM | POA: Diagnosis not present

## 2020-10-04 DIAGNOSIS — Z113 Encounter for screening for infections with a predominantly sexual mode of transmission: Secondary | ICD-10-CM | POA: Diagnosis not present

## 2020-10-04 DIAGNOSIS — Z794 Long term (current) use of insulin: Secondary | ICD-10-CM | POA: Diagnosis not present

## 2020-10-04 DIAGNOSIS — B20 Human immunodeficiency virus [HIV] disease: Secondary | ICD-10-CM | POA: Diagnosis not present

## 2020-10-05 LAB — T-HELPER CELL (CD4) - (RCID CLINIC ONLY)
CD4 % Helper T Cell: 29 % — ABNORMAL LOW (ref 33–65)
CD4 T Cell Abs: 880 /uL (ref 400–1790)

## 2020-10-05 LAB — URINE CYTOLOGY ANCILLARY ONLY
Chlamydia: NEGATIVE
Comment: NEGATIVE
Comment: NORMAL
Neisseria Gonorrhea: NEGATIVE

## 2020-10-07 LAB — COMPREHENSIVE METABOLIC PANEL
AG Ratio: 1.8 (calc) (ref 1.0–2.5)
ALT: 35 U/L (ref 9–46)
AST: 26 U/L (ref 10–35)
Albumin: 4.6 g/dL (ref 3.6–5.1)
Alkaline phosphatase (APISO): 47 U/L (ref 35–144)
BUN: 14 mg/dL (ref 7–25)
CO2: 27 mmol/L (ref 20–32)
Calcium: 9.9 mg/dL (ref 8.6–10.3)
Chloride: 103 mmol/L (ref 98–110)
Creat: 1.04 mg/dL (ref 0.70–1.33)
Globulin: 2.5 g/dL (calc) (ref 1.9–3.7)
Glucose, Bld: 109 mg/dL — ABNORMAL HIGH (ref 65–99)
Potassium: 4.4 mmol/L (ref 3.5–5.3)
Sodium: 139 mmol/L (ref 135–146)
Total Bilirubin: 0.3 mg/dL (ref 0.2–1.2)
Total Protein: 7.1 g/dL (ref 6.1–8.1)

## 2020-10-07 LAB — CBC
HCT: 45 % (ref 38.5–50.0)
Hemoglobin: 15.3 g/dL (ref 13.2–17.1)
MCH: 31 pg (ref 27.0–33.0)
MCHC: 34 g/dL (ref 32.0–36.0)
MCV: 91.1 fL (ref 80.0–100.0)
MPV: 9.6 fL (ref 7.5–12.5)
Platelets: 260 10*3/uL (ref 140–400)
RBC: 4.94 10*6/uL (ref 4.20–5.80)
RDW: 12.6 % (ref 11.0–15.0)
WBC: 7.8 10*3/uL (ref 3.8–10.8)

## 2020-10-07 LAB — LIPID PANEL
Cholesterol: 150 mg/dL (ref <200)
HDL: 31 mg/dL — ABNORMAL LOW (ref >=40)
LDL Cholesterol (Calc): 78 mg/dL (calc)
Non-HDL Cholesterol (Calc): 119 mg/dL (calc) (ref <130)
Total CHOL/HDL Ratio: 4.8 (calc) (ref <5.0)
Triglycerides: 339 mg/dL — ABNORMAL HIGH (ref <150)

## 2020-10-07 LAB — HIV-1 RNA QUANT-NO REFLEX-BLD
HIV 1 RNA Quant: NOT DETECTED Copies/mL
HIV-1 RNA Quant, Log: NOT DETECTED Log cps/mL

## 2020-10-07 LAB — RPR: RPR Ser Ql: NONREACTIVE

## 2020-10-14 DIAGNOSIS — E781 Pure hyperglyceridemia: Secondary | ICD-10-CM | POA: Diagnosis not present

## 2020-10-14 DIAGNOSIS — E782 Mixed hyperlipidemia: Secondary | ICD-10-CM | POA: Diagnosis not present

## 2020-10-14 DIAGNOSIS — Z79899 Other long term (current) drug therapy: Secondary | ICD-10-CM | POA: Diagnosis not present

## 2020-10-14 DIAGNOSIS — I10 Essential (primary) hypertension: Secondary | ICD-10-CM | POA: Diagnosis not present

## 2020-10-14 DIAGNOSIS — E1169 Type 2 diabetes mellitus with other specified complication: Secondary | ICD-10-CM | POA: Diagnosis not present

## 2020-10-14 DIAGNOSIS — Z6835 Body mass index (BMI) 35.0-35.9, adult: Secondary | ICD-10-CM | POA: Diagnosis not present

## 2020-10-19 ENCOUNTER — Encounter: Payer: Medicare HMO | Admitting: Infectious Diseases

## 2020-10-26 ENCOUNTER — Other Ambulatory Visit: Payer: Self-pay

## 2020-10-26 ENCOUNTER — Encounter: Payer: Self-pay | Admitting: Infectious Diseases

## 2020-10-26 ENCOUNTER — Ambulatory Visit (INDEPENDENT_AMBULATORY_CARE_PROVIDER_SITE_OTHER): Payer: Medicare HMO | Admitting: Infectious Diseases

## 2020-10-26 VITALS — BP 117/81 | HR 68 | Temp 98.2°F | Wt 261.0 lb

## 2020-10-26 DIAGNOSIS — Z794 Long term (current) use of insulin: Secondary | ICD-10-CM

## 2020-10-26 DIAGNOSIS — Z79899 Other long term (current) drug therapy: Secondary | ICD-10-CM

## 2020-10-26 DIAGNOSIS — E781 Pure hyperglyceridemia: Secondary | ICD-10-CM

## 2020-10-26 DIAGNOSIS — Z23 Encounter for immunization: Secondary | ICD-10-CM | POA: Diagnosis not present

## 2020-10-26 DIAGNOSIS — F341 Dysthymic disorder: Secondary | ICD-10-CM | POA: Diagnosis not present

## 2020-10-26 DIAGNOSIS — B20 Human immunodeficiency virus [HIV] disease: Secondary | ICD-10-CM

## 2020-10-26 DIAGNOSIS — G4709 Other insomnia: Secondary | ICD-10-CM

## 2020-10-26 DIAGNOSIS — F4310 Post-traumatic stress disorder, unspecified: Secondary | ICD-10-CM

## 2020-10-26 DIAGNOSIS — I10 Essential (primary) hypertension: Secondary | ICD-10-CM | POA: Diagnosis not present

## 2020-10-26 DIAGNOSIS — Z113 Encounter for screening for infections with a predominantly sexual mode of transmission: Secondary | ICD-10-CM | POA: Diagnosis not present

## 2020-10-26 DIAGNOSIS — E1140 Type 2 diabetes mellitus with diabetic neuropathy, unspecified: Secondary | ICD-10-CM

## 2020-10-26 NOTE — Addendum Note (Signed)
Addended by: Aundria Rud on: 10/26/2020 04:47 PM   Modules accepted: Orders
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
## 2020-10-26 NOTE — Assessment & Plan Note (Signed)
Continues on trazodone.

## 2020-10-26 NOTE — Progress Notes (Signed)
Subjective:    Patient ID: Jon Shannon, male  DOB: 29-Jul-1966, 55 y.o.        MRN: [id number]   HPI 55yo M with HIV+, previous stage IIa Hodgkin's lymphoma (2013), tx with ABVD 10-2011 with resultant neuropathy. He had onc f/u May 2021, clear. Has DM2as well from steroids. Severe hypertriglyceridemia. Has PCP in [place] Chancy Milroy, J).  Taking 4g fish oil, fenofibrate, and pravastatin. Now also on tulicity. Has given him some nausea.  On triumeq. His last A1C was 5.9% (? date). FSG 102-153. Today 119.  Still helping out taking care of his mom.  Has rats at his house.   Exercising at gym. Has lost 20#. Still hungry occas. Eating only 2 meals/day, "I forget to eat'.  Feels like his vision is blurrier. He has seen ophtho this year.   R great toe sore has closed. Believes he squeezed a splinter out of it.  Having (partial) dental extractions tomorrow .   HIV 1 RNA Quant  Date Value  10/04/2020 Not Detected Copies/mL  12/25/2019 <20 NOT DETECTED copies/mL  03/20/2019 <20 DETECTED copies/mL (A)   CD4 T Cell Abs (/uL)  Date Value  10/04/2020 880  12/25/2019 856  03/20/2019 688   Lab Results  Component Value Date   CHOL 150 10/04/2020   HDL 31 (L) 10/04/2020   LDLCALC 78 10/04/2020   TRIG 339 (H) 10/04/2020   CHOLHDL 4.8 10/04/2020      Health Maintenance  Topic Date Due  . FOOT EXAM  09/20/2018  . COVID-19 Vaccine (3 - Moderna risk 4-dose series) 12/08/2019  . HEMOGLOBIN A1C  06/26/2020  . OPHTHALMOLOGY EXAM  08/31/2020  . COLONOSCOPY (Pts 45-38yrs Insurance coverage will need to be confirmed)  05/02/2025  . TETANUS/TDAP  09/20/2026  . INFLUENZA VACCINE  Completed  . PNEUMOCOCCAL POLYSACCHARIDE VACCINE AGE 74-64 HIGH RISK  Completed  . Hepatitis C Screening  Completed  . HIV Screening  Completed  . HPV VACCINES  Aged Out      Review of Systems  Constitutional: Negative for weight loss.  Eyes: Positive for blurred vision.  Respiratory: Negative for cough  and shortness of breath.   Gastrointestinal: Negative for constipation and diarrhea.  Genitourinary: Negative for dysuria.  Neurological: Positive for sensory change.  Psychiatric/Behavioral: The patient does not have insomnia (taking trazodone).     Please see HPI. All other systems reviewed and negative.     Objective:  Physical Exam Vitals reviewed.  Constitutional:      General: He is not in acute distress.    Appearance: He is obese. He is not ill-appearing.  HENT:     Mouth/Throat:     Mouth: Mucous membranes are moist.     Pharynx: No oropharyngeal exudate.  Eyes:     Extraocular Movements: Extraocular movements intact.     Pupils: Pupils are equal, round, and reactive to light.  Cardiovascular:     Rate and Rhythm: Normal rate and regular rhythm.     Pulses:          Dorsalis pedis pulses are 3+ on the right side and 3+ on the left side.  Pulmonary:     Effort: Pulmonary effort is normal.     Breath sounds: Normal breath sounds.  Abdominal:     General: Bowel sounds are normal. There is no distension.     Palpations: Abdomen is soft.     Tenderness: There is no abdominal tenderness.  Musculoskeletal:  General: Normal range of motion.     Cervical back: Normal range of motion and neck supple.     Right lower leg: No edema.     Left lower leg: No edema.  Feet:     Right foot:     Protective Sensation: 2 sites tested. 2 sites sensed.     Skin integrity: No ulcer, blister or fissure.     Toenail Condition: Right toenails are abnormally thick and long.     Left foot:     Protective Sensation: 2 sites tested. 2 sites sensed.     Skin integrity: Callus present. No ulcer or blister.     Toenail Condition: Left toenails are abnormally thick and long.     Comments: Small callus on L great toe from prev wound. No fluctuance. No tenderness.  Neurological:     Mental Status: He is alert.  Psychiatric:        Mood and Affect: Mood normal.             Assessment & Plan:
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
## 2020-10-26 NOTE — Assessment & Plan Note (Signed)
Appreciate PCP f/u On ACE-I Well controlled today.

## 2020-10-26 NOTE — Assessment & Plan Note (Signed)
-   See PTSD

## 2020-10-26 NOTE — Assessment & Plan Note (Signed)
Lab Results  Component Value Date   CHOL 150 10/04/2020   HDL 31 (L) 10/04/2020   LDLCALC 78 10/04/2020   TRIG 339 (H) 10/04/2020   CHOLHDL 4.8 10/04/2020    On statin.  LFTs normal.

## 2020-10-26 NOTE — Assessment & Plan Note (Addendum)
He is doing well Continues on triumeq PCV 23 today Has gotten COVID vax.  Offered/refused condoms. Not sexually active.  rtc in 9 months.

## 2020-12-02 ENCOUNTER — Other Ambulatory Visit: Payer: Self-pay | Admitting: Infectious Diseases

## 2020-12-02 DIAGNOSIS — C8190 Hodgkin lymphoma, unspecified, unspecified site: Secondary | ICD-10-CM

## 2020-12-09 ENCOUNTER — Encounter: Payer: Self-pay | Admitting: Adult Health

## 2020-12-09 ENCOUNTER — Inpatient Hospital Stay: Payer: Medicare HMO | Attending: Adult Health | Admitting: Adult Health

## 2020-12-09 ENCOUNTER — Other Ambulatory Visit: Payer: Self-pay

## 2020-12-09 VITALS — BP 129/72 | HR 59 | Temp 98.1°F | Resp 18 | Ht 73.0 in | Wt 255.2 lb

## 2020-12-09 DIAGNOSIS — Z923 Personal history of irradiation: Secondary | ICD-10-CM | POA: Insufficient documentation

## 2020-12-09 DIAGNOSIS — Z8571 Personal history of Hodgkin lymphoma: Secondary | ICD-10-CM

## 2020-12-09 DIAGNOSIS — Z9221 Personal history of antineoplastic chemotherapy: Secondary | ICD-10-CM | POA: Diagnosis not present

## 2020-12-09 NOTE — Progress Notes (Signed)
SURVIVORSHIP VISIT:   REASON FOR VISIT:  Routine follow-up for history of lymphoma.   BRIEF ONCOLOGIC HISTORY:   Jon Shannon was diagnosed with classical Hodgkin lymphoma, stage IIA in 2013 after presenting initially with palpable lymphadenopathy in his neck.The patient was treated with several cycles of ABVD from April 2013 to August 2013. Subsequently he underwent consolidation radiation treatment. His PET/CT scan from November 2014 showed no evidence of disease recurrence.  INTERVAL HISTORY:   Jon Shannon presents to the Survivorship Clinic today for routine follow-up for his history of lymphoma.  He is doing moderately well.  He has noted a balance change and one fall in the past year.  He has chronic headaches that are unchanged, however has no vision change, numbness, focal weakness.  He is exercising at the gym most days of the week. He was started on Trulicity and notes an improvement in his weight.     REVIEW OF SYSTEMS:  Review of Systems  Constitutional: Negative for appetite change, chills, fatigue, fever and unexpected weight change.  HENT:   Negative for hearing loss, lump/mass, mouth sores, sore throat and trouble swallowing.   Eyes: Negative for eye problems and icterus.  Respiratory: Negative for chest tightness, cough and shortness of breath.   Cardiovascular: Negative for chest pain, leg swelling and palpitations.  Gastrointestinal: Negative for abdominal distention, abdominal pain, constipation, diarrhea, nausea and vomiting.  Endocrine: Negative for hot flashes.  Genitourinary: Negative for difficulty urinating.   Musculoskeletal: Positive for gait problem. Negative for arthralgias.  Skin: Negative for itching and rash.  Neurological: Positive for gait problem, headaches (chronic) and speech difficulty (chronic speech disfluency). Negative for dizziness, extremity weakness and numbness.  Hematological: Negative for adenopathy. Does not bruise/bleed easily.   Psychiatric/Behavioral: Negative for depression. The patient is not nervous/anxious.          PAST MEDICAL/SURGICAL HISTORY:  Past Medical History:  Diagnosis Date  . DJD (degenerative joint disease)   . Elevated serum creatinine 06/15/2014  . Hepatitis A    resolved.   Marland Kitchen HIV INFECTION 11/30/2009   no history of opportunistic infection  . Hodgkin's lymphoma ([place]) 11/09/2011  . Hx of radiation therapy 04/03/12 -04/29/12   Hodgkin's disease -neck  . Hyperlipidemia 08/07/2011  . HYPERTENSION 11/30/2009  . Hypertriglyceridemia   . PTSD (post-traumatic stress disorder)   . Rectal bleeding 09/04/2013  . Weight loss 05/29/2013   Past Surgical History:  Procedure Laterality Date  . left cervical node excisional biopsy  10/2011  . left index finger trauma repair    . [place]    . TONSILLECTOMY     as teenager     ALLERGIES:  Allergies  Allergen Reactions  . Shrimp [Shellfish Allergy] Anaphylaxis    Lobster, too  . Cefdinir Other (See Comments)    Oral ulcer, mild.      CURRENT MEDICATIONS:  Outpatient Encounter Medications as of 12/09/2020  Medication Sig Note  . aspirin 81 MG tablet Take 81 mg by mouth 2 (two) times daily.    . B Complex-C (B-COMPLEX WITH VITAMIN C) tablet Take 2 tablets by mouth daily.    . bisoprolol-hydrochlorothiazide (ZIAC) 5-6.25 MG tablet Take 1 tablet by mouth daily.   . Chromium Picolinate (CHROMIUM PICOLATE PO) Take 2 tablets by mouth 2 (two) times daily.    Marland Kitchen CINNAMON PO Take 2 capsules by mouth 2 (two) times daily.    . Dulaglutide (TRULICITY) 1.5 IP/3.8SN SOPN Inject 1.5 mg into the skin once a week.   Marland Kitchen  escitalopram (LEXAPRO) 20 MG tablet Take 20 mg by mouth daily.    . fenofibrate 160 MG tablet Take 160 mg by mouth daily.   . fish oil-omega-3 fatty acids 1000 MG capsule Take 2 g by mouth 2 (two) times daily. Two capsules BID   . GARLIC OIL PO Take 2 tablets by mouth daily.    . INVOKAMET [phone] MG TABS Take 1 tablet by mouth 2 (two) times  daily.  11/05/2014: Received from: External Pharmacy  . lisinopril (PRINIVIL,ZESTRIL) 5 MG tablet Take 5 mg by mouth daily.   Marland Kitchen loratadine (CLARITIN) 10 MG tablet Take 20 mg by mouth daily. 11/05/2014: 11/05/14 takes 20 mg  . metoprolol succinate (TOPROL-XL) 50 MG 24 hr tablet Take by mouth daily.    . pravastatin (PRAVACHOL) 40 MG tablet Take 40 mg by mouth daily.   . Probiotic Product (PROBIOTIC DAILY PO) Take by mouth daily.   . traZODone (DESYREL) 50 MG tablet TAKE 1-2 TABLETS AT BEDTIME AS NEEDED FOR SLEEP   . TRIUMEQ 600-50-300 MG tablet TAKE 1 TABLET BY MOUTH DAILY WITH BREAKFAST   . vitamin C (ASCORBIC ACID) 500 MG tablet Take 1,000 mg by mouth 2 (two) times daily.    Marland Kitchen VITAMIN D PO Take 2 tablets by mouth 2 (two) times daily.   . [DISCONTINUED] pravastatin (PRAVACHOL) 20 MG tablet Take 20 mg by mouth daily.  (Patient not taking: Reported on 10/26/2020)    No facility-administered encounter medications on file as of 12/09/2020.     ONCOLOGIC FAMILY HISTORY:  Family History  Problem Relation Age of Onset  . Cancer Mother 47       breast cancer   . Seizures Mother   . Hypertension Mother   . Hyperlipidemia Mother   . Diabetes Father   . Hypertension Father   . Hyperlipidemia Father   . Cancer Father        brain tumor (unknown type)  . Cancer Maternal Aunt        lung cancer  . Cancer Maternal Grandmother        CLL    GENETIC COUNSELING/TESTING: Not at this time  SOCIAL HISTORY:  Single, lives in [place], [place], at home with his parents.   Social History   Socioeconomic History  . Marital status: Single    Spouse name: Not on file  . Number of children: 0  . Years of education: 73  . Highest education level: Not on file  Occupational History  . Occupation: Job Pharmacist, community: [employer]  Tobacco Use  . Smoking status: Former Smoker    Packs/day: 1.00    Types: Cigarettes    Quit date: 07/01/2011    Years since quitting: 9.4  . Smokeless tobacco: Never Used   Substance and Sexual Activity  . Alcohol use: No    Alcohol/week: 0.0 standard drinks  . Drug use: No  . Sexual activity: Not Currently    Comment: pt. declined condoms  Other Topics Concern  . Not on file  Social History Narrative  . Not on file   Social Determinants of Health   Financial Resource Strain: Not on file  Food Insecurity: Not on file  Transportation Needs: Not on file  Physical Activity: Not on file  Stress: Not on file  Social Connections: Not on file  Intimate Partner Violence: Not on file      OBJECTIVE:  BP 129/72 (BP Location: Left Arm, Patient Position: Sitting)   Pulse Marland Kitchen)  59   Temp 98.1 F (36.7 C) (Tympanic)   Resp 18   Ht 6\' 1"  (1.854 m)   Wt 255 lb 3.2 oz (115.8 kg)   SpO2 100%   BMI 33.67 kg/m  GENERAL: Patient is a well appearing male in no acute distress HEENT:  Sclerae anicteric.  PERRL.  Mask in place. Neck is supple.  NODES:  No cervical, supraclavicular, or axillary lymphadenopathy palpated.  LUNGS:  Clear to auscultation bilaterally.  No wheezes or rhonchi. HEART:  Regular rate and rhythm. No murmur appreciated. ABDOMEN:  Soft, nontender.  Positive, normoactive bowel sounds. No organomegaly palpated. MSK:  No focal spinal tenderness to palpation. 5/5 strength x 4 extremities EXTREMITIES:  No peripheral edema.   SKIN:  Clear with no obvious rashes or skin changes. No nail dyscrasia. NEURO:  Nonfocal. CNII-XII intact Well oriented.  Appropriate affect.   LABORATORY DATA:  None for this visit   DIAGNOSTIC IMAGING:  None for this visit    ASSESSMENT AND PLAN:  Mr.. Shannon is a pleasant 55 y.o. male with history of lymphoma, diagnosed in 2013; treated with chemotherapy and radiation.  He presents to the Survivorship Clinic for surveillance and routine follow-up.   1. History of lymphoma:  Jon Shannon is currently clinically and radiographically without evidence of disease or recurrence of lymphoma.   He will follow up in the  Survivorship Clinic in 1 year with LTS f/u.  I will follow along with his labs completed with Dr. Johnnye Sima.  I encouraged him to call me with any questions or concerns before her next visit at the cancer center, and I would be happy to see her sooner, if needed.    2.  Balance change: He is exercising, and working on losing weight.  I offered him a brain MRI and he declined due to cost.  He will let me know if he changes his mind.  3. Cancer screening:  Due to Jon Shannon's history and her age, she should receive screening for skin cancers, colon cancer, and prostate cancers. He was encouraged to follow-up with her PCP for appropriate cancer screenings.   4. Health maintenance and wellness promotion: Jon Shannon was encouraged to consume 5-7 servings of fruits and vegetables per day. She was also encouraged to engage in moderate to vigorous exercise for 30 minutes per day most days of the week. She was instructed to limit her alcohol consumption and continue to abstain from tobacco use.    Follow up instructions:    -Return in one year for continued follow up   The patient was provided an opportunity to ask questions and all were answered. The patient agreed with the plan and demonstrated an understanding of the instructions.   Total encounter time: 20 minutes* of face to face visit time, reviewing his lab work, and documenting the encounter time.  Wilber Bihari, NP 12/09/20 1:43 PM Medical Oncology and Hematology [facility] [place], [place] [address] Tel. [phone]    Fax. [phone]   *Total Encounter Time as defined by the Centers for Medicare and Medicaid Services includes, in addition to the face-to-face time of a patient visit (documented in the note above) non-face-to-face time: obtaining and reviewing outside history, ordering and reviewing medications, tests or procedures, care coordination (communications with other health care professionals or  caregivers) and documentation in the medical record.    Note: PRIMARY CARE PROVIDER [name], [place] [phone]

## 2021-01-04 DIAGNOSIS — L089 Local infection of the skin and subcutaneous tissue, unspecified: Secondary | ICD-10-CM | POA: Diagnosis not present

## 2021-01-04 DIAGNOSIS — Z6825 Body mass index (BMI) 25.0-25.9, adult: Secondary | ICD-10-CM | POA: Diagnosis not present

## 2021-01-11 ENCOUNTER — Other Ambulatory Visit: Payer: Self-pay | Admitting: Infectious Diseases

## 2021-01-11 DIAGNOSIS — B20 Human immunodeficiency virus [HIV] disease: Secondary | ICD-10-CM

## 2021-04-20 DIAGNOSIS — Z6835 Body mass index (BMI) 35.0-35.9, adult: Secondary | ICD-10-CM | POA: Diagnosis not present

## 2021-04-20 DIAGNOSIS — S46912A Strain of unspecified muscle, fascia and tendon at shoulder and upper arm level, left arm, initial encounter: Secondary | ICD-10-CM | POA: Diagnosis not present

## 2021-05-04 DIAGNOSIS — E1169 Type 2 diabetes mellitus with other specified complication: Secondary | ICD-10-CM | POA: Diagnosis not present

## 2021-05-04 DIAGNOSIS — Z6834 Body mass index (BMI) 34.0-34.9, adult: Secondary | ICD-10-CM | POA: Diagnosis not present

## 2021-05-04 DIAGNOSIS — I10 Essential (primary) hypertension: Secondary | ICD-10-CM | POA: Diagnosis not present

## 2021-05-04 DIAGNOSIS — E781 Pure hyperglyceridemia: Secondary | ICD-10-CM | POA: Diagnosis not present

## 2021-05-04 DIAGNOSIS — Z125 Encounter for screening for malignant neoplasm of prostate: Secondary | ICD-10-CM | POA: Diagnosis not present

## 2021-05-04 DIAGNOSIS — E782 Mixed hyperlipidemia: Secondary | ICD-10-CM | POA: Diagnosis not present

## 2021-06-01 ENCOUNTER — Other Ambulatory Visit: Payer: Self-pay | Admitting: Infectious Diseases

## 2021-06-01 DIAGNOSIS — C8190 Hodgkin lymphoma, unspecified, unspecified site: Secondary | ICD-10-CM

## 2021-06-13 ENCOUNTER — Encounter: Payer: Self-pay | Admitting: Infectious Diseases

## 2021-07-05 DIAGNOSIS — H524 Presbyopia: Secondary | ICD-10-CM | POA: Diagnosis not present

## 2021-07-05 DIAGNOSIS — H52223 Regular astigmatism, bilateral: Secondary | ICD-10-CM | POA: Diagnosis not present

## 2021-07-11 DIAGNOSIS — H11051 Peripheral pterygium, progressive, right eye: Secondary | ICD-10-CM | POA: Diagnosis not present

## 2021-07-11 DIAGNOSIS — E119 Type 2 diabetes mellitus without complications: Secondary | ICD-10-CM | POA: Diagnosis not present

## 2021-07-19 ENCOUNTER — Other Ambulatory Visit: Payer: Medicare HMO

## 2021-07-19 ENCOUNTER — Other Ambulatory Visit: Payer: Self-pay

## 2021-07-19 ENCOUNTER — Other Ambulatory Visit (HOSPITAL_COMMUNITY)
Admission: RE | Admit: 2021-07-19 | Discharge: 2021-07-19 | Disposition: A | Discharge status: Home / Self Care | Payer: Medicare HMO | Source: Ambulatory Visit | Attending: Infectious Diseases | Admitting: Infectious Diseases

## 2021-07-19 DIAGNOSIS — Z79899 Other long term (current) drug therapy: Secondary | ICD-10-CM

## 2021-07-19 DIAGNOSIS — Z113 Encounter for screening for infections with a predominantly sexual mode of transmission: Secondary | ICD-10-CM | POA: Diagnosis not present

## 2021-07-19 DIAGNOSIS — E1140 Type 2 diabetes mellitus with diabetic neuropathy, unspecified: Secondary | ICD-10-CM | POA: Diagnosis not present

## 2021-07-19 DIAGNOSIS — B20 Human immunodeficiency virus [HIV] disease: Secondary | ICD-10-CM

## 2021-07-19 DIAGNOSIS — Z794 Long term (current) use of insulin: Secondary | ICD-10-CM

## 2021-07-20 LAB — T-HELPER CELL (CD4) - (RCID CLINIC ONLY)
CD4 % Helper T Cell: 27 % — ABNORMAL LOW (ref 33–65)
CD4 T Cell Abs: 647 /uL (ref 400–1790)

## 2021-07-20 LAB — URINE CYTOLOGY ANCILLARY ONLY
Chlamydia: NEGATIVE
Comment: NEGATIVE
Comment: NORMAL
Neisseria Gonorrhea: NEGATIVE

## 2021-07-22 LAB — COMPREHENSIVE METABOLIC PANEL
AG Ratio: 1.9 (calc) (ref 1.0–2.5)
ALT: 22 U/L (ref 9–46)
AST: 20 U/L (ref 10–35)
Albumin: 4.4 g/dL (ref 3.6–5.1)
Alkaline phosphatase (APISO): 38 U/L (ref 35–144)
BUN: 22 mg/dL (ref 7–25)
CO2: 25 mmol/L (ref 20–32)
Calcium: 9 mg/dL (ref 8.6–10.3)
Chloride: 107 mmol/L (ref 98–110)
Creat: 1.06 mg/dL (ref 0.70–1.30)
Globulin: 2.3 g/dL (calc) (ref 1.9–3.7)
Glucose, Bld: 107 mg/dL — ABNORMAL HIGH (ref 65–99)
Potassium: 4.1 mmol/L (ref 3.5–5.3)
Sodium: 141 mmol/L (ref 135–146)
Total Bilirubin: 0.3 mg/dL (ref 0.2–1.2)
Total Protein: 6.7 g/dL (ref 6.1–8.1)

## 2021-07-22 LAB — CBC
HCT: 41.2 % (ref 38.5–50.0)
Hemoglobin: 13.9 g/dL (ref 13.2–17.1)
MCH: 30.5 pg (ref 27.0–33.0)
MCHC: 33.7 g/dL (ref 32.0–36.0)
MCV: 90.5 fL (ref 80.0–100.0)
MPV: 9.5 fL (ref 7.5–12.5)
Platelets: 236 10*3/uL (ref 140–400)
RBC: 4.55 10*6/uL (ref 4.20–5.80)
RDW: 12.2 % (ref 11.0–15.0)
WBC: 6.3 10*3/uL (ref 3.8–10.8)

## 2021-07-22 LAB — HIV-1 RNA QUANT-NO REFLEX-BLD
HIV 1 RNA Quant: NOT DETECTED Copies/mL
HIV-1 RNA Quant, Log: NOT DETECTED Log cps/mL

## 2021-07-22 LAB — HEMOGLOBIN A1C
Hgb A1c MFr Bld: 5.3 % of total Hgb (ref <5.7)
Mean Plasma Glucose: 105 mg/dL
eAG (mmol/L): 5.8 mmol/L

## 2021-07-22 LAB — LIPID PANEL
Cholesterol: 107 mg/dL (ref <200)
HDL: 35 mg/dL — ABNORMAL LOW (ref >=40)
LDL Cholesterol (Calc): 53 mg/dL (calc)
Non-HDL Cholesterol (Calc): 72 mg/dL (calc) (ref <130)
Total CHOL/HDL Ratio: 3.1 (calc) (ref <5.0)
Triglycerides: 105 mg/dL (ref <150)

## 2021-07-22 LAB — RPR: RPR Ser Ql: NONREACTIVE

## 2021-08-02 ENCOUNTER — Encounter: Payer: Medicare HMO | Admitting: Infectious Diseases

## 2021-08-02 DIAGNOSIS — L03115 Cellulitis of right lower limb: Secondary | ICD-10-CM | POA: Diagnosis not present

## 2021-08-02 DIAGNOSIS — J329 Chronic sinusitis, unspecified: Secondary | ICD-10-CM | POA: Diagnosis not present

## 2021-08-02 DIAGNOSIS — Z20828 Contact with and (suspected) exposure to other viral communicable diseases: Secondary | ICD-10-CM | POA: Diagnosis not present

## 2021-08-04 ENCOUNTER — Encounter: Payer: Medicare HMO | Admitting: Infectious Diseases

## 2021-08-04 DIAGNOSIS — L03115 Cellulitis of right lower limb: Secondary | ICD-10-CM | POA: Diagnosis not present

## 2021-08-04 DIAGNOSIS — Z6834 Body mass index (BMI) 34.0-34.9, adult: Secondary | ICD-10-CM | POA: Diagnosis not present

## 2021-08-08 DIAGNOSIS — Z6834 Body mass index (BMI) 34.0-34.9, adult: Secondary | ICD-10-CM | POA: Diagnosis not present

## 2021-08-08 DIAGNOSIS — L03115 Cellulitis of right lower limb: Secondary | ICD-10-CM | POA: Diagnosis not present

## 2021-08-12 ENCOUNTER — Encounter: Payer: Self-pay | Admitting: Infectious Diseases

## 2021-08-12 ENCOUNTER — Ambulatory Visit (INDEPENDENT_AMBULATORY_CARE_PROVIDER_SITE_OTHER): Payer: Medicare HMO | Admitting: Infectious Diseases

## 2021-08-12 ENCOUNTER — Other Ambulatory Visit: Payer: Self-pay

## 2021-08-12 DIAGNOSIS — B20 Human immunodeficiency virus [HIV] disease: Secondary | ICD-10-CM

## 2021-08-12 DIAGNOSIS — L02415 Cutaneous abscess of right lower limb: Secondary | ICD-10-CM | POA: Diagnosis not present

## 2021-08-12 DIAGNOSIS — E1140 Type 2 diabetes mellitus with diabetic neuropathy, unspecified: Secondary | ICD-10-CM | POA: Diagnosis not present

## 2021-08-12 DIAGNOSIS — Z794 Long term (current) use of insulin: Secondary | ICD-10-CM | POA: Diagnosis not present

## 2021-08-12 DIAGNOSIS — L03115 Cellulitis of right lower limb: Secondary | ICD-10-CM | POA: Diagnosis not present

## 2021-08-12 NOTE — Progress Notes (Signed)
° °  Subjective:    Patient ID: Jon Shannon, male  DOB: 1966-01-11, 56 y.o.        MRN: [id number]   HPI 56 yo M with HIV+, previous stage IIa Hodgkin's lymphoma (2013), tx with ABVD 10-2011 with resultant neuropathy. He had onc f/u May 2021, clear.  Has DM2 as well from steroids. Severe hypertriglyceridemia (Trig 546 on 03-2019). Has PCP in [place] Chancy Milroy, J) .  Taking 4g fish oil, fenofibrate, and pravastatin.  On triumeq.  FSG was 97 this AM. Has not been adherent to diet. Highest FSG 167 (when he had cellulitis).  Seen in ED Jan 2 for cellulitis of RLE and Staph bacteremia.  Was treated by his PCP, completing PO for "a couple more days". Clindamycin?  Had ophtho dec 2022  blurry vision. Got new glasses.   Helping take care of his elderly parents.    HIV 1 RNA Quant  Date Value  07/19/2021 Not Detected Copies/mL  10/04/2020 Not Detected Copies/mL  12/25/2019 <20 NOT DETECTED copies/mL   CD4 T Cell Abs (/uL)  Date Value  07/19/2021 647  10/04/2020 880  12/25/2019 856     Health Maintenance  Topic Date Due   Zoster Vaccines- Shingrix (1 of 2) Never done   FOOT EXAM  09/20/2018   OPHTHALMOLOGY EXAM  08/31/2020   HEMOGLOBIN A1C  01/17/2022   COLONOSCOPY (Pts 45-51yrs Insurance coverage will need to be confirmed)  05/02/2025   TETANUS/TDAP  09/20/2026   Pneumococcal Vaccine 45-4 Years old (4 - PPSV23 if available, else PCV20) 03/04/2030   INFLUENZA VACCINE  Completed   COVID-19 Vaccine  Completed   Hepatitis C Screening  Completed   HIV Screening  Completed   HPV VACCINES  Aged Out      Review of Systems  Constitutional:  Negative for chills, fever and weight loss.  Eyes:  Positive for blurred vision.  Respiratory:  Positive for cough. Negative for shortness of breath.   Gastrointestinal:  Negative for constipation and diarrhea.  Genitourinary:  Negative for dysuria.  Neurological:  Positive for sensory change (feet and hands).   Please see HPI. All other  systems reviewed and negative.     Objective:  Physical Exam Vitals reviewed.  Constitutional:      Appearance: Normal appearance.  HENT:     Mouth/Throat:     Mouth: Mucous membranes are moist.     Pharynx: No oropharyngeal exudate.  Eyes:     Pupils: Pupils are equal, round, and reactive to light.  Cardiovascular:     Rate and Rhythm: Normal rate and regular rhythm.  Pulmonary:     Effort: Pulmonary effort is normal.     Breath sounds: Normal breath sounds.  Abdominal:     General: Bowel sounds are normal. There is no distension.     Palpations: Abdomen is soft.     Tenderness: There is no abdominal tenderness.  Musculoskeletal:        General: Normal range of motion.     Cervical back: Normal range of motion and neck supple.       Legs:     Comments: Few small erosions. No d/c. Mild heat. Erythema. Edema+.  Neurological:     Mental Status: He is alert.     Sensory: Sensory deficit present.            Assessment & Plan:

## 2021-08-12 NOTE — Assessment & Plan Note (Signed)
Appears to be improving.  Will repeat his BCx today.

## 2021-08-12 NOTE — Assessment & Plan Note (Signed)
He is doing well Will send him for shingles vax He has gotten covid and flu vax Not sexually active Will return for mpox vax next week.  rtc in 5 months.

## 2021-08-12 NOTE — Assessment & Plan Note (Signed)
His Glc are suprisingly good given his diet issues.  He will cont to work on this and f/u with PCP.

## 2021-08-12 NOTE — Assessment & Plan Note (Signed)
He will work on diet and exercise after his wound has healed.

## 2021-08-15 ENCOUNTER — Ambulatory Visit (INDEPENDENT_AMBULATORY_CARE_PROVIDER_SITE_OTHER): Payer: Medicare HMO

## 2021-08-15 ENCOUNTER — Other Ambulatory Visit: Payer: Self-pay

## 2021-08-15 DIAGNOSIS — Z23 Encounter for immunization: Secondary | ICD-10-CM | POA: Diagnosis not present

## 2021-08-15 NOTE — Progress Notes (Signed)
° ° °  [facility]  Name:  Garhett Bernhard    MRN: [id number] DOB: 12-11-1965   08/15/2021  Mr. Gilham was observed post JYNNEOS immunization for 15 minutes without incident. He was provided with Vaccine Information Sheet and instruction to access the V-Safe system.   Mr. Staples was instructed to call 911 with any severe reactions post vaccine: Difficulty breathing  Swelling of face and throat  A fast heartbeat  A bad rash all over body  Dizziness and weakness       Yecenia Dalgleish P Shaunika Italiano, CMA

## 2021-08-17 LAB — CULTURE, BLOOD (SINGLE)
MICRO NUMBER:: 12868886
MICRO NUMBER:: 12868887
Result:: NO GROWTH
Result:: NO GROWTH
SPECIMEN QUALITY:: ADEQUATE
SPECIMEN QUALITY:: ADEQUATE

## 2021-08-26 ENCOUNTER — Other Ambulatory Visit: Payer: Self-pay | Admitting: Infectious Diseases

## 2021-08-26 DIAGNOSIS — C8190 Hodgkin lymphoma, unspecified, unspecified site: Secondary | ICD-10-CM

## 2021-09-01 DIAGNOSIS — S39012A Strain of muscle, fascia and tendon of lower back, initial encounter: Secondary | ICD-10-CM | POA: Diagnosis not present

## 2021-09-01 DIAGNOSIS — Z6835 Body mass index (BMI) 35.0-35.9, adult: Secondary | ICD-10-CM | POA: Diagnosis not present

## 2021-09-07 DIAGNOSIS — M5126 Other intervertebral disc displacement, lumbar region: Secondary | ICD-10-CM | POA: Diagnosis not present

## 2021-09-07 DIAGNOSIS — M48061 Spinal stenosis, lumbar region without neurogenic claudication: Secondary | ICD-10-CM | POA: Diagnosis not present

## 2021-09-07 DIAGNOSIS — Z859 Personal history of malignant neoplasm, unspecified: Secondary | ICD-10-CM | POA: Diagnosis not present

## 2021-09-07 DIAGNOSIS — M47816 Spondylosis without myelopathy or radiculopathy, lumbar region: Secondary | ICD-10-CM | POA: Diagnosis not present

## 2021-09-07 DIAGNOSIS — D1809 Hemangioma of other sites: Secondary | ICD-10-CM | POA: Diagnosis not present

## 2021-09-09 ENCOUNTER — Encounter: Payer: Self-pay | Admitting: Adult Health

## 2021-09-12 ENCOUNTER — Telehealth: Payer: Self-pay

## 2021-09-12 ENCOUNTER — Other Ambulatory Visit: Payer: Self-pay

## 2021-09-12 ENCOUNTER — Ambulatory Visit (INDEPENDENT_AMBULATORY_CARE_PROVIDER_SITE_OTHER): Payer: Medicare HMO

## 2021-09-12 DIAGNOSIS — Z23 Encounter for immunization: Secondary | ICD-10-CM | POA: Diagnosis not present

## 2021-09-12 NOTE — Progress Notes (Signed)
° ° °  [facility]  Name:  Jon Shannon    MRN: [id number] DOB: [birth year]   09/12/2021  Mr. [name] was observed post JYNNEOS immunization for 15 minutes without incident. He was provided with Vaccine Information Sheet and instruction to access the V-Safe system.   Mr. [name] was instructed to call 911 with any severe reactions post vaccine: Difficulty breathing  Swelling of face and throat  A fast heartbeat  A bad rash all over body  Dizziness and weakness       Ashlen Kiger P Aneliese Beaudry, CMA

## 2021-09-12 NOTE — Telephone Encounter (Signed)
Called pt regarding his mychart message, pt VM is full so unable to leave information there.

## 2021-09-12 NOTE — Telephone Encounter (Signed)
Called pt, informed pt of upcoming appointment with Dr. Alvy Bimler to review scans, pt verbalized understanding and thanks.  Pt requests we call his cell phone for additional follow up if needed

## 2021-09-20 ENCOUNTER — Telehealth: Payer: Self-pay

## 2021-09-20 NOTE — Telephone Encounter (Signed)
Returned his call. Verified upcoming appts. He verbalized understanding.

## 2021-09-22 ENCOUNTER — Encounter: Payer: Self-pay | Admitting: Hematology and Oncology

## 2021-09-22 ENCOUNTER — Inpatient Hospital Stay: Payer: Medicare HMO

## 2021-09-22 ENCOUNTER — Other Ambulatory Visit: Payer: Self-pay

## 2021-09-22 ENCOUNTER — Inpatient Hospital Stay: Payer: Medicare HMO | Attending: Hematology and Oncology | Admitting: Hematology and Oncology

## 2021-09-22 VITALS — BP 119/89 | HR 66 | Temp 98.0°F | Resp 18 | Ht 73.0 in | Wt 263.8 lb

## 2021-09-22 DIAGNOSIS — Z7982 Long term (current) use of aspirin: Secondary | ICD-10-CM | POA: Insufficient documentation

## 2021-09-22 DIAGNOSIS — Z8571 Personal history of Hodgkin lymphoma: Secondary | ICD-10-CM

## 2021-09-22 DIAGNOSIS — B20 Human immunodeficiency virus [HIV] disease: Secondary | ICD-10-CM

## 2021-09-22 DIAGNOSIS — M549 Dorsalgia, unspecified: Secondary | ICD-10-CM | POA: Diagnosis not present

## 2021-09-22 DIAGNOSIS — G8929 Other chronic pain: Secondary | ICD-10-CM | POA: Diagnosis not present

## 2021-09-22 DIAGNOSIS — Z7985 Long-term (current) use of injectable non-insulin antidiabetic drugs: Secondary | ICD-10-CM | POA: Insufficient documentation

## 2021-09-22 DIAGNOSIS — Z79899 Other long term (current) drug therapy: Secondary | ICD-10-CM | POA: Insufficient documentation

## 2021-09-22 LAB — CBC WITH DIFFERENTIAL (CANCER CENTER ONLY)
Abs Immature Granulocytes: 0.01 10*3/uL (ref 0.00–0.07)
Basophils Absolute: 0 10*3/uL (ref 0.0–0.1)
Basophils Relative: 0 %
Eosinophils Absolute: 0.1 10*3/uL (ref 0.0–0.5)
Eosinophils Relative: 2 %
HCT: 41.2 % (ref 39.0–52.0)
Hemoglobin: 13.8 g/dL (ref 13.0–17.0)
Immature Granulocytes: 0 %
Lymphocytes Relative: 51 %
Lymphs Abs: 3.2 10*3/uL (ref 0.7–4.0)
MCH: 30.1 pg (ref 26.0–34.0)
MCHC: 33.5 g/dL (ref 30.0–36.0)
MCV: 90 fL (ref 80.0–100.0)
Monocytes Absolute: 0.4 10*3/uL (ref 0.1–1.0)
Monocytes Relative: 7 %
Neutro Abs: 2.5 10*3/uL (ref 1.7–7.7)
Neutrophils Relative %: 40 %
Platelet Count: 213 10*3/uL (ref 150–400)
RBC: 4.58 MIL/uL (ref 4.22–5.81)
RDW: 12.5 % (ref 11.5–15.5)
WBC Count: 6.2 10*3/uL (ref 4.0–10.5)
nRBC: 0 % (ref 0.0–0.2)

## 2021-09-22 NOTE — Progress Notes (Signed)
[place] OFFICE PROGRESS NOTE  Patient Care Team: [name], MD as PCP - General (Family Medicine) [name], MD as PCP - Infectious Diseases (Infectious Diseases) [name], MD (Otolaryngology) [name], MD as Referring Physician (Family Medicine)  ASSESSMENT & PLAN:  History of hodgkin's lymphoma The patient is a long-term cancer survivor I have reviewed his prior PET/CT imaging The abnormality noted on recent MRI is probably there before The sclerotic lesion is unlikely due to recurrent cancer The patient is concerned about leukemia Clinically, he is not symptomatic His CBC is completely normal He is reassured I do not see a good reason to order repeat staging imaging studies in the absence of symptoms He is in agreement I will remove him from cancer survivorship clinic and we will see him back again in 6 months for further follow-up He is in agreement  Chronic back pain greater than 3 months duration He has intermittent chronic back pain MRI showed evidence of neural foraminal stenosis and disc bulging We discussed importance of weight loss and exercise  Orders Placed This Encounter  Procedures   CBC with Differential ([facility] Only)    Standing Status:   Future    Number of Occurrences:   1    Standing Expiration Date:   09/22/2022   CBC with Differential ([facility] Only)    Standing Status:   Future    Standing Expiration Date:   09/22/2022   Comprehensive metabolic panel    Standing Status:   Future    Standing Expiration Date:   09/22/2022   Lactate dehydrogenase    Standing Status:   Future    Standing Expiration Date:   09/22/2022    All questions were answered. The patient knows to call the clinic with any problems, questions or concerns. The total time spent in the appointment was 30 minutes encounter with patients including review of chart and various tests results, discussions about plan of care and coordination  of care plan   Heath Lark, MD 09/22/2021 2:02 PM  INTERVAL HISTORY: Please see below for problem oriented charting. he returns for urgent evaluation and concern over recent abnormal imaging He has intermittent chronic back pain On September 07, 2021, he underwent MRI of the lumbar spine with and without contrast The imaging study showed low density sclerotic lesion within L3 Cause is unknown He was recommended for further evaluation He denies recent injury to his back He was off work for several years He exercise on a regular basis and go to the gym Because of his back pain, he underwent MRI imaging which showed abnormalities The patient recalls being told that he might get leukemia from his previous imaging and hence return for further evaluation Denies new lymphadenopathy or abnormal weight loss  REVIEW OF SYSTEMS:   Constitutional: Denies fevers, chills or abnormal weight loss Eyes: Denies blurriness of vision Ears, nose, mouth, throat, and face: Denies mucositis or sore throat Respiratory: Denies cough, dyspnea or wheezes Cardiovascular: Denies palpitation, chest discomfort or lower extremity swelling Gastrointestinal:  Denies nausea, heartburn or change in bowel habits Skin: Denies abnormal skin rashes Lymphatics: Denies new lymphadenopathy or easy bruising Neurological:Denies numbness, tingling or new weaknesses Behavioral/Psych: Mood is stable, no new changes  All other systems were reviewed with the patient and are negative.  I have reviewed the past medical history, past surgical history, social history and family history with the patient and they are unchanged from previous note.  ALLERGIES:  is allergic to shrimp [shellfish allergy] and cefdinir.  MEDICATIONS:  Current Outpatient Medications  Medication Sig Dispense Refill   aspirin 81 MG tablet Take 81 mg by mouth 2 (two) times daily.      B Complex-C (B-COMPLEX WITH VITAMIN C) tablet Take 2 tablets by mouth daily.       bisoprolol-hydrochlorothiazide (ZIAC) 5-6.25 MG tablet Take 1 tablet by mouth daily.     Chromium Picolinate (CHROMIUM PICOLATE PO) Take 2 tablets by mouth 2 (two) times daily.      CINNAMON PO Take 2 capsules by mouth 2 (two) times daily.      Dulaglutide (TRULICITY) 1.5 ON/6.2XB SOPN Inject 1.5 mg into the skin once a week.     escitalopram (LEXAPRO) 20 MG tablet Take 20 mg by mouth daily.   0   fenofibrate 160 MG tablet Take 160 mg by mouth daily.     fish oil-omega-3 fatty acids 1000 MG capsule Take 2 g by mouth 2 (two) times daily. Two capsules BID     GARLIC OIL PO Take 2 tablets by mouth daily.      INVOKAMET [phone] MG TABS Take 1 tablet by mouth 2 (two) times daily.      lisinopril (PRINIVIL,ZESTRIL) 5 MG tablet Take 5 mg by mouth daily.     loratadine (CLARITIN) 10 MG tablet Take 20 mg by mouth daily.     metoprolol succinate (TOPROL-XL) 50 MG 24 hr tablet Take by mouth daily.   0   pravastatin (PRAVACHOL) 40 MG tablet Take 40 mg by mouth daily.     Probiotic Product (PROBIOTIC DAILY PO) Take by mouth daily.     traZODone (DESYREL) 50 MG tablet TAKE 1-2 TABLETS AT BEDTIME AS NEEDED FOR SLEEP  5   TRIUMEQ 600-50-300 MG tablet TAKE 1 TABLET BY MOUTH DAILY WITH BREAKFAST 30 tablet 0   vitamin C (ASCORBIC ACID) 500 MG tablet Take 1,000 mg by mouth 2 (two) times daily.      VITAMIN D PO Take 2 tablets by mouth 2 (two) times daily.     No current facility-administered medications for this visit.    SUMMARY OF ONCOLOGIC HISTORY: Thispatient was diagnosed with classical Hodgkin lymphoma, stage IIA last year after presenting initially with palpable lymphadenopathy in his neck. The patient was treated with several cycles of ABVD from April 2013 to August 2013. Subsequently he underwent consolidation radiation treatment. His PET/CT scan from November 2014 showed no evidence of disease recurrence.  PHYSICAL EXAMINATION: ECOG PERFORMANCE STATUS: 1 - Symptomatic but completely  ambulatory  Vitals:   09/22/21 1109  BP: 119/89  Pulse: 66  Resp: 18  Temp: 98 F (36.7 C)  SpO2: 99%   Filed Weights   09/22/21 1109  Weight: 263 lb 12.8 oz (119.7 kg)    GENERAL:alert, no distress and comfortable NEURO: alert & oriented x 3 with fluent speech, no focal motor/sensory deficits  LABORATORY DATA:  I have reviewed the data as listed    Component Value Date/Time   NA 141 07/19/2021 0901   NA 139 12/14/2016 1339   K 4.1 07/19/2021 0901   K 4.2 12/14/2016 1339   CL 107 07/19/2021 0901   CL 100 09/16/2012 1514   CO2 25 07/19/2021 0901   CO2 27 12/14/2016 1339   GLUCOSE 107 (H) 07/19/2021 0901   GLUCOSE 103 12/14/2016 1339   GLUCOSE 116 (H) 09/16/2012 1514   BUN 22 07/19/2021 0901   BUN 12.8 12/14/2016 1339   CREATININE 1.06  07/19/2021 0901   CREATININE 1.1 12/14/2016 1339   CALCIUM 9.0 07/19/2021 0901   CALCIUM 9.9 12/14/2016 1339   PROT 6.7 07/19/2021 0901   PROT 7.7 12/14/2016 1339   ALBUMIN 4.6 12/28/2017 1412   ALBUMIN 4.6 12/14/2016 1339   AST 20 07/19/2021 0901   AST 35 (H) 12/28/2017 1412   AST 35 (H) 12/14/2016 1339   ALT 22 07/19/2021 0901   ALT 52 12/28/2017 1412   ALT 59 (H) 12/14/2016 1339   ALKPHOS 38 (L) 12/28/2017 1412   ALKPHOS 44 12/14/2016 1339   BILITOT 0.3 07/19/2021 0901   BILITOT 0.3 12/28/2017 1412   BILITOT 0.43 12/14/2016 1339   GFRNONAA >60 12/28/2017 1412   GFRNONAA 68 08/22/2017 1049   GFRAA >60 12/28/2017 1412   GFRAA 79 08/22/2017 1049    No results found for: SPEP, UPEP  Lab Results  Component Value Date   WBC 6.2 09/22/2021   NEUTROABS 2.5 09/22/2021   HGB 13.8 09/22/2021   HCT 41.2 09/22/2021   MCV 90.0 09/22/2021   PLT 213 09/22/2021      Chemistry      Component Value Date/Time   NA 141 07/19/2021 0901   NA 139 12/14/2016 1339   K 4.1 07/19/2021 0901   K 4.2 12/14/2016 1339   CL 107 07/19/2021 0901   CL 100 09/16/2012 1514   CO2 25 07/19/2021 0901   CO2 27 12/14/2016 1339   BUN 22  07/19/2021 0901   BUN 12.8 12/14/2016 1339   CREATININE 1.06 07/19/2021 0901   CREATININE 1.1 12/14/2016 1339   GLU 286 (H) 11/24/2011 1446      Component Value Date/Time   CALCIUM 9.0 07/19/2021 0901   CALCIUM 9.9 12/14/2016 1339   ALKPHOS 38 (L) 12/28/2017 1412   ALKPHOS 44 12/14/2016 1339   AST 20 07/19/2021 0901   AST 35 (H) 12/28/2017 1412   AST 35 (H) 12/14/2016 1339   ALT 22 07/19/2021 0901   ALT 52 12/28/2017 1412   ALT 59 (H) 12/14/2016 1339   BILITOT 0.3 07/19/2021 0901   BILITOT 0.3 12/28/2017 1412   BILITOT 0.43 12/14/2016 1339       RADIOGRAPHIC STUDIES: I have reviewed prior imaging study with the patient I have personally reviewed the radiological images as listed and agreed with the findings in the report.

## 2021-09-22 NOTE — Assessment & Plan Note (Signed)
He has intermittent chronic back pain MRI showed evidence of neural foraminal stenosis and disc bulging We discussed importance of weight loss and exercise

## 2021-09-22 NOTE — Assessment & Plan Note (Signed)
The patient is a long-term cancer survivor I have reviewed his prior PET/CT imaging The abnormality noted on recent MRI is probably there before The sclerotic lesion is unlikely due to recurrent cancer The patient is concerned about leukemia Clinically, he is not symptomatic His CBC is completely normal He is reassured I do not see a good reason to order repeat staging imaging studies in the absence of symptoms He is in agreement I will remove him from cancer survivorship clinic and we will see him back again in 6 months for further follow-up He is in agreement

## 2021-09-22 NOTE — Progress Notes (Signed)
Pt is follwed by me and was seen in clinic for vaccine which I authorize.

## 2021-09-26 ENCOUNTER — Other Ambulatory Visit: Payer: Self-pay | Admitting: Infectious Diseases

## 2021-09-26 ENCOUNTER — Other Ambulatory Visit (HOSPITAL_COMMUNITY): Payer: Self-pay

## 2021-09-26 DIAGNOSIS — C8190 Hodgkin lymphoma, unspecified, unspecified site: Secondary | ICD-10-CM

## 2021-11-16 DIAGNOSIS — M7552 Bursitis of left shoulder: Secondary | ICD-10-CM | POA: Diagnosis not present

## 2021-12-08 ENCOUNTER — Encounter: Payer: Medicare HMO | Admitting: Adult Health

## 2021-12-21 ENCOUNTER — Encounter: Payer: Self-pay | Admitting: Infectious Diseases

## 2022-01-04 ENCOUNTER — Other Ambulatory Visit: Payer: Self-pay

## 2022-01-04 DIAGNOSIS — B20 Human immunodeficiency virus [HIV] disease: Secondary | ICD-10-CM

## 2022-01-04 DIAGNOSIS — Z113 Encounter for screening for infections with a predominantly sexual mode of transmission: Secondary | ICD-10-CM

## 2022-01-05 ENCOUNTER — Other Ambulatory Visit: Payer: Self-pay

## 2022-01-05 ENCOUNTER — Other Ambulatory Visit (HOSPITAL_COMMUNITY)
Admission: RE | Admit: 2022-01-05 | Discharge: 2022-01-05 | Disposition: A | Discharge status: Home / Self Care | Payer: Medicare HMO | Source: Ambulatory Visit | Attending: Infectious Diseases | Admitting: Infectious Diseases

## 2022-01-05 ENCOUNTER — Other Ambulatory Visit: Payer: Medicare HMO

## 2022-01-05 DIAGNOSIS — B20 Human immunodeficiency virus [HIV] disease: Secondary | ICD-10-CM | POA: Diagnosis not present

## 2022-01-05 DIAGNOSIS — Z113 Encounter for screening for infections with a predominantly sexual mode of transmission: Secondary | ICD-10-CM

## 2022-01-06 LAB — T-HELPER CELL (CD4) - (RCID CLINIC ONLY)
CD4 % Helper T Cell: 32 % — ABNORMAL LOW (ref 33–65)
CD4 T Cell Abs: 668 /uL (ref 400–1790)

## 2022-01-08 LAB — COMPLETE METABOLIC PANEL WITH GFR
AG Ratio: 1.9 (calc) (ref 1.0–2.5)
ALT: 32 U/L (ref 9–46)
AST: 24 U/L (ref 10–35)
Albumin: 4.2 g/dL (ref 3.6–5.1)
Alkaline phosphatase (APISO): 39 U/L (ref 35–144)
BUN: 22 mg/dL (ref 7–25)
CO2: 26 mmol/L (ref 20–32)
Calcium: 9.4 mg/dL (ref 8.6–10.3)
Chloride: 107 mmol/L (ref 98–110)
Creat: 1.04 mg/dL (ref 0.70–1.30)
Globulin: 2.2 g/dL (calc) (ref 1.9–3.7)
Glucose, Bld: 117 mg/dL — ABNORMAL HIGH (ref 65–99)
Potassium: 3.9 mmol/L (ref 3.5–5.3)
Sodium: 142 mmol/L (ref 135–146)
Total Bilirubin: 0.3 mg/dL (ref 0.2–1.2)
Total Protein: 6.4 g/dL (ref 6.1–8.1)
eGFR: 84 mL/min/{1.73_m2} (ref >=60)

## 2022-01-08 LAB — CBC WITH DIFFERENTIAL/PLATELET
Absolute Monocytes: 409 cells/uL (ref 200–950)
Basophils Absolute: 22 cells/uL (ref 0–200)
Basophils Relative: 0.4 %
Eosinophils Absolute: 73 cells/uL (ref 15–500)
Eosinophils Relative: 1.3 %
HCT: 41.1 % (ref 38.5–50.0)
Hemoglobin: 13.8 g/dL (ref 13.2–17.1)
Lymphs Abs: 2234 cells/uL (ref 850–3900)
MCH: 30.4 pg (ref 27.0–33.0)
MCHC: 33.6 g/dL (ref 32.0–36.0)
MCV: 90.5 fL (ref 80.0–100.0)
MPV: 9.4 fL (ref 7.5–12.5)
Monocytes Relative: 7.3 %
Neutro Abs: 2862 cells/uL (ref 1500–7800)
Neutrophils Relative %: 51.1 %
Platelets: 226 10*3/uL (ref 140–400)
RBC: 4.54 10*6/uL (ref 4.20–5.80)
RDW: 12.9 % (ref 11.0–15.0)
Total Lymphocyte: 39.9 %
WBC: 5.6 10*3/uL (ref 3.8–10.8)

## 2022-01-08 LAB — HIV-1 RNA QUANT-NO REFLEX-BLD
HIV 1 RNA Quant: NOT DETECTED Copies/mL
HIV-1 RNA Quant, Log: NOT DETECTED Log cps/mL

## 2022-01-08 LAB — RPR: RPR Ser Ql: NONREACTIVE

## 2022-01-09 ENCOUNTER — Encounter: Payer: Self-pay | Admitting: Infectious Diseases

## 2022-01-09 LAB — URINE CYTOLOGY ANCILLARY ONLY
Chlamydia: NEGATIVE
Comment: NEGATIVE
Comment: NORMAL
Neisseria Gonorrhea: NEGATIVE

## 2022-01-19 ENCOUNTER — Other Ambulatory Visit: Payer: Self-pay

## 2022-01-19 ENCOUNTER — Ambulatory Visit: Payer: Medicare HMO | Admitting: Infectious Diseases

## 2022-01-19 ENCOUNTER — Encounter: Payer: Self-pay | Admitting: Infectious Diseases

## 2022-01-19 VITALS — BP 115/78 | HR 74 | Resp 16 | Ht 73.0 in | Wt 257.4 lb

## 2022-01-19 DIAGNOSIS — I10 Essential (primary) hypertension: Secondary | ICD-10-CM | POA: Diagnosis not present

## 2022-01-19 DIAGNOSIS — B20 Human immunodeficiency virus [HIV] disease: Secondary | ICD-10-CM | POA: Diagnosis not present

## 2022-01-19 DIAGNOSIS — E11621 Type 2 diabetes mellitus with foot ulcer: Secondary | ICD-10-CM

## 2022-01-19 DIAGNOSIS — E1140 Type 2 diabetes mellitus with diabetic neuropathy, unspecified: Secondary | ICD-10-CM

## 2022-01-19 DIAGNOSIS — Z79899 Other long term (current) drug therapy: Secondary | ICD-10-CM

## 2022-01-19 DIAGNOSIS — R519 Headache, unspecified: Secondary | ICD-10-CM | POA: Diagnosis not present

## 2022-01-19 DIAGNOSIS — E781 Pure hyperglyceridemia: Secondary | ICD-10-CM | POA: Diagnosis not present

## 2022-01-19 DIAGNOSIS — R7989 Other specified abnormal findings of blood chemistry: Secondary | ICD-10-CM

## 2022-01-19 DIAGNOSIS — Z113 Encounter for screening for infections with a predominantly sexual mode of transmission: Secondary | ICD-10-CM

## 2022-01-19 DIAGNOSIS — Z794 Long term (current) use of insulin: Secondary | ICD-10-CM

## 2022-01-19 DIAGNOSIS — G8929 Other chronic pain: Secondary | ICD-10-CM

## 2022-01-19 DIAGNOSIS — L97519 Non-pressure chronic ulcer of other part of right foot with unspecified severity: Secondary | ICD-10-CM

## 2022-01-19 NOTE — Assessment & Plan Note (Signed)
Significantly better on medication.  Continue to watch.

## 2022-01-19 NOTE — Assessment & Plan Note (Signed)
Will send him to neuro as he wishes.

## 2022-01-19 NOTE — Assessment & Plan Note (Addendum)
Well controlled today On bisoprolol, hctz, lisinopril, metoprolol Appreciate Dr Marella Bile f/u.

## 2022-01-19 NOTE — Assessment & Plan Note (Signed)
He is going to f/u with Podiatry.  I have some concern that he reports drainage. No d/c today and non-tender (though he had neuropathy).  Will f/u.

## 2022-01-19 NOTE — Assessment & Plan Note (Signed)
He is doing well We reviewed his labs Continue on triumeq.  Will see him back in 9 months.

## 2022-01-19 NOTE — Assessment & Plan Note (Signed)
Normal now.  Continue to watch.

## 2022-01-19 NOTE — Assessment & Plan Note (Signed)
He appears fairly well controlled but has multiple issues (neuropathy, vision, foot wound).  Appreciate his PCP f/u.

## 2022-02-06 ENCOUNTER — Ambulatory Visit: Payer: Medicare HMO

## 2022-03-03 ENCOUNTER — Other Ambulatory Visit: Payer: Self-pay | Admitting: Infectious Diseases

## 2022-03-03 DIAGNOSIS — C8190 Hodgkin lymphoma, unspecified, unspecified site: Secondary | ICD-10-CM

## 2022-03-21 DIAGNOSIS — Z79899 Other long term (current) drug therapy: Secondary | ICD-10-CM | POA: Diagnosis not present

## 2022-03-21 DIAGNOSIS — E781 Pure hyperglyceridemia: Secondary | ICD-10-CM | POA: Diagnosis not present

## 2022-03-21 DIAGNOSIS — F331 Major depressive disorder, recurrent, moderate: Secondary | ICD-10-CM | POA: Diagnosis not present

## 2022-03-21 DIAGNOSIS — Z0000 Encounter for general adult medical examination without abnormal findings: Secondary | ICD-10-CM | POA: Diagnosis not present

## 2022-03-21 DIAGNOSIS — E1169 Type 2 diabetes mellitus with other specified complication: Secondary | ICD-10-CM | POA: Diagnosis not present

## 2022-03-21 DIAGNOSIS — Z6835 Body mass index (BMI) 35.0-35.9, adult: Secondary | ICD-10-CM | POA: Diagnosis not present

## 2022-03-21 DIAGNOSIS — E782 Mixed hyperlipidemia: Secondary | ICD-10-CM | POA: Diagnosis not present

## 2022-03-23 ENCOUNTER — Other Ambulatory Visit: Payer: Self-pay | Admitting: Hematology and Oncology

## 2022-03-23 ENCOUNTER — Inpatient Hospital Stay: Payer: Medicare HMO | Attending: Hematology and Oncology

## 2022-03-23 ENCOUNTER — Encounter: Payer: Self-pay | Admitting: Infectious Diseases

## 2022-03-23 ENCOUNTER — Other Ambulatory Visit: Payer: Self-pay

## 2022-03-23 ENCOUNTER — Inpatient Hospital Stay (HOSPITAL_BASED_OUTPATIENT_CLINIC_OR_DEPARTMENT_OTHER): Payer: Medicare HMO | Admitting: Hematology and Oncology

## 2022-03-23 ENCOUNTER — Encounter: Payer: Self-pay | Admitting: Hematology and Oncology

## 2022-03-23 DIAGNOSIS — Z79899 Other long term (current) drug therapy: Secondary | ICD-10-CM | POA: Diagnosis not present

## 2022-03-23 DIAGNOSIS — Z7985 Long-term (current) use of injectable non-insulin antidiabetic drugs: Secondary | ICD-10-CM | POA: Diagnosis not present

## 2022-03-23 DIAGNOSIS — Z7982 Long term (current) use of aspirin: Secondary | ICD-10-CM | POA: Diagnosis not present

## 2022-03-23 DIAGNOSIS — Z8571 Personal history of Hodgkin lymphoma: Secondary | ICD-10-CM | POA: Insufficient documentation

## 2022-03-23 DIAGNOSIS — B20 Human immunodeficiency virus [HIV] disease: Secondary | ICD-10-CM

## 2022-03-23 LAB — CBC WITH DIFFERENTIAL (CANCER CENTER ONLY)
Abs Immature Granulocytes: 0.03 10*3/uL (ref 0.00–0.07)
Basophils Absolute: 0 10*3/uL (ref 0.0–0.1)
Basophils Relative: 0 %
Eosinophils Absolute: 0.1 10*3/uL (ref 0.0–0.5)
Eosinophils Relative: 1 %
HCT: 40.4 % (ref 39.0–52.0)
Hemoglobin: 13.9 g/dL (ref 13.0–17.0)
Immature Granulocytes: 1 %
Lymphocytes Relative: 44 %
Lymphs Abs: 2.7 10*3/uL (ref 0.7–4.0)
MCH: 31.1 pg (ref 26.0–34.0)
MCHC: 34.4 g/dL (ref 30.0–36.0)
MCV: 90.4 fL (ref 80.0–100.0)
Monocytes Absolute: 0.4 10*3/uL (ref 0.1–1.0)
Monocytes Relative: 7 %
Neutro Abs: 2.9 10*3/uL (ref 1.7–7.7)
Neutrophils Relative %: 47 %
Platelet Count: 215 10*3/uL (ref 150–400)
RBC: 4.47 MIL/uL (ref 4.22–5.81)
RDW: 12.4 % (ref 11.5–15.5)
WBC Count: 6.1 10*3/uL (ref 4.0–10.5)
nRBC: 0 % (ref 0.0–0.2)

## 2022-03-23 LAB — COMPREHENSIVE METABOLIC PANEL
ALT: 28 U/L (ref 0–44)
AST: 25 U/L (ref 15–41)
Albumin: 4.5 g/dL (ref 3.5–5.0)
Alkaline Phosphatase: 30 U/L — ABNORMAL LOW (ref 38–126)
Anion gap: 6 (ref 5–15)
BUN: 21 mg/dL — ABNORMAL HIGH (ref 6–20)
CO2: 27 mmol/L (ref 22–32)
Calcium: 9.6 mg/dL (ref 8.9–10.3)
Chloride: 105 mmol/L (ref 98–111)
Creatinine, Ser: 1.13 mg/dL (ref 0.61–1.24)
GFR, Estimated: >60 mL/min (ref >60)
Glucose, Bld: 114 mg/dL — ABNORMAL HIGH (ref 70–99)
Potassium: 4 mmol/L (ref 3.5–5.1)
Sodium: 138 mmol/L (ref 135–145)
Total Bilirubin: 0.4 mg/dL (ref 0.3–1.2)
Total Protein: 6.8 g/dL (ref 6.5–8.1)

## 2022-03-23 LAB — LACTATE DEHYDROGENASE: LDH: 136 U/L (ref 98–192)

## 2022-03-23 NOTE — Assessment & Plan Note (Signed)
He stated he is exercising on a regular basis He has not lost weight since last time I saw him We discussed importance of dietary modification and lifestyle changes

## 2022-03-23 NOTE — Progress Notes (Signed)
[place] OFFICE PROGRESS NOTE  Patient Care Team: [name], MD as PCP - General (Family Medicine) [name], MD as PCP - Infectious Diseases (Infectious Diseases) [name], MD (Otolaryngology) [name], MD as Referring Physician (Family Medicine)  ASSESSMENT & PLAN:  History of hodgkin's lymphoma The patient is a long-term cancer survivor Clinically, he is not symptomatic His CBC is completely normal and exam is normal I do not see a good reason to order repeat staging imaging studies in the absence of symptoms I will see him on a yearly basis for follow-up  Morbid obesity He stated he is exercising on a regular basis He has not lost weight since last time I saw him We discussed importance of dietary modification and lifestyle changes  No orders of the defined types were placed in this encounter.   All questions were answered. The patient knows to call the clinic with any problems, questions or concerns. The total time spent in the appointment was 20 minutes encounter with patients including review of chart and various tests results, discussions about plan of care and coordination of care plan   Heath Lark, MD 03/23/2022 12:12 PM  INTERVAL HISTORY: Please see below for problem oriented charting. he returns for surveillance follow-up for history of Hodgkin lymphoma Since last time I saw him, he denies recent back pain No new lymphadenopathy He is attempting to lose weight and he goes to the gym 5 days a week  REVIEW OF SYSTEMS:   Constitutional: Denies fevers, chills or abnormal weight loss Eyes: Denies blurriness of vision Ears, nose, mouth, throat, and face: Denies mucositis or sore throat Respiratory: Denies cough, dyspnea or wheezes Cardiovascular: Denies palpitation, chest discomfort or lower extremity swelling Gastrointestinal:  Denies nausea, heartburn or change in bowel habits Skin: Denies abnormal skin rashes Lymphatics:  Denies new lymphadenopathy or easy bruising Neurological:Denies numbness, tingling or new weaknesses Behavioral/Psych: Mood is stable, no new changes  All other systems were reviewed with the patient and are negative.  I have reviewed the past medical history, past surgical history, social history and family history with the patient and they are unchanged from previous note.  ALLERGIES:  is allergic to shrimp [shellfish allergy] and cefdinir.  MEDICATIONS:  Current Outpatient Medications  Medication Sig Dispense Refill   aspirin 81 MG tablet Take 81 mg by mouth 2 (two) times daily.      B Complex-C (B-COMPLEX WITH VITAMIN C) tablet Take 2 tablets by mouth daily.      bisoprolol-hydrochlorothiazide (ZIAC) 5-6.25 MG tablet Take 1 tablet by mouth daily.     Chromium Picolinate (CHROMIUM PICOLATE PO) Take 2 tablets by mouth 2 (two) times daily.      CINNAMON PO Take 2 capsules by mouth 2 (two) times daily.      Dulaglutide (TRULICITY) 1.5 CB/6.3AG SOPN Inject 3 mg into the skin once a week.     escitalopram (LEXAPRO) 20 MG tablet Take 20 mg by mouth daily.   0   fenofibrate 160 MG tablet Take 160 mg by mouth daily.     fish oil-omega-3 fatty acids 1000 MG capsule Take 2 g by mouth 2 (two) times daily. Two capsules BID     GARLIC OIL PO Take 2 tablets by mouth daily.      INVOKAMET [phone] MG TABS Take 1 tablet by mouth 2 (two) times daily.      lisinopril (PRINIVIL,ZESTRIL) 5 MG tablet Take 5 mg by mouth daily.  loratadine (CLARITIN) 10 MG tablet Take 20 mg by mouth daily.     metoprolol succinate (TOPROL-XL) 50 MG 24 hr tablet Take by mouth daily.   0   pravastatin (PRAVACHOL) 40 MG tablet Take 40 mg by mouth daily.     Probiotic Product (PROBIOTIC DAILY PO) Take by mouth daily.     tamsulosin (FLOMAX) 0.4 MG CAPS capsule Take 0.4 mg by mouth.     traZODone (DESYREL) 50 MG tablet TAKE 1-2 TABLETS AT BEDTIME AS NEEDED FOR SLEEP  5   TRIUMEQ 600-50-300 MG tablet TAKE 1 TABLET BY MOUTH  DAILY WITH BREAKFAST 30 tablet 5   vitamin C (ASCORBIC ACID) 500 MG tablet Take 1,000 mg by mouth 2 (two) times daily.      VITAMIN D PO Take 2 tablets by mouth 2 (two) times daily.     No current facility-administered medications for this visit.    SUMMARY OF ONCOLOGIC HISTORY: Thispatient was diagnosed with classical Hodgkin lymphoma, stage IIA last year after presenting initially with palpable lymphadenopathy in his neck. The patient was treated with several cycles of ABVD from April 2013 to August 2013. Subsequently he underwent consolidation radiation treatment. His PET/CT scan from November 2014 showed no evidence of disease recurrence.  PHYSICAL EXAMINATION: ECOG PERFORMANCE STATUS: 0 - Asymptomatic  Vitals:   03/23/22 1106  BP: 119/60  Pulse: 65  Resp: 18  Temp: 97.6 F (36.4 C)  SpO2: 98%   Filed Weights   03/23/22 1106  Weight: 262 lb 3.2 oz (118.9 kg)    GENERAL:alert, no distress and comfortable SKIN: skin color, texture, turgor are normal, no rashes or significant lesions EYES: normal, Conjunctiva are pink and non-injected, sclera clear OROPHARYNX:no exudate, no erythema and lips, buccal mucosa, and tongue normal  NECK: supple, thyroid normal size, non-tender, without nodularity LYMPH:  no palpable lymphadenopathy in the cervical, axillary or inguinal LUNGS: clear to auscultation and percussion with normal breathing effort HEART: regular rate & rhythm and no murmurs and no lower extremity edema ABDOMEN:abdomen soft, non-tender and normal bowel sounds Musculoskeletal:no cyanosis of digits and no clubbing  NEURO: alert & oriented x 3 with fluent speech, no focal motor/sensory deficits  LABORATORY DATA:  I have reviewed the data as listed    Component Value Date/Time   NA 138 03/23/2022 1036   NA 139 12/14/2016 1339   K 4.0 03/23/2022 1036   K 4.2 12/14/2016 1339   CL 105 03/23/2022 1036   CL 100 09/16/2012 1514   CO2 27 03/23/2022 1036   CO2 27 12/14/2016  1339   GLUCOSE 114 (H) 03/23/2022 1036   GLUCOSE 103 12/14/2016 1339   GLUCOSE 116 (H) 09/16/2012 1514   BUN 21 (H) 03/23/2022 1036   BUN 12.8 12/14/2016 1339   CREATININE 1.13 03/23/2022 1036   CREATININE 1.04 01/05/2022 0938   CREATININE 1.1 12/14/2016 1339   CALCIUM 9.6 03/23/2022 1036   CALCIUM 9.9 12/14/2016 1339   PROT 6.8 03/23/2022 1036   PROT 7.7 12/14/2016 1339   ALBUMIN 4.5 03/23/2022 1036   ALBUMIN 4.6 12/14/2016 1339   AST 25 03/23/2022 1036   AST 35 (H) 12/28/2017 1412   AST 35 (H) 12/14/2016 1339   ALT 28 03/23/2022 1036   ALT 52 12/28/2017 1412   ALT 59 (H) 12/14/2016 1339   ALKPHOS 30 (L) 03/23/2022 1036   ALKPHOS 44 12/14/2016 1339   BILITOT 0.4 03/23/2022 1036   BILITOT 0.3 12/28/2017 1412   BILITOT 0.43 12/14/2016 1339  GFRNONAA >60 03/23/2022 1036   GFRNONAA >60 12/28/2017 1412   GFRNONAA 68 08/22/2017 1049   GFRAA >60 12/28/2017 1412   GFRAA 79 08/22/2017 1049    No results found for: "SPEP", "UPEP"  Lab Results  Component Value Date   WBC 6.1 03/23/2022   NEUTROABS 2.9 03/23/2022   HGB 13.9 03/23/2022   HCT 40.4 03/23/2022   MCV 90.4 03/23/2022   PLT 215 03/23/2022      Chemistry      Component Value Date/Time   NA 138 03/23/2022 1036   NA 139 12/14/2016 1339   K 4.0 03/23/2022 1036   K 4.2 12/14/2016 1339   CL 105 03/23/2022 1036   CL 100 09/16/2012 1514   CO2 27 03/23/2022 1036   CO2 27 12/14/2016 1339   BUN 21 (H) 03/23/2022 1036   BUN 12.8 12/14/2016 1339   CREATININE 1.13 03/23/2022 1036   CREATININE 1.04 01/05/2022 0938   CREATININE 1.1 12/14/2016 1339   GLU 286 (H) 11/24/2011 1446      Component Value Date/Time   CALCIUM 9.6 03/23/2022 1036   CALCIUM 9.9 12/14/2016 1339   ALKPHOS 30 (L) 03/23/2022 1036   ALKPHOS 44 12/14/2016 1339   AST 25 03/23/2022 1036   AST 35 (H) 12/28/2017 1412   AST 35 (H) 12/14/2016 1339   ALT 28 03/23/2022 1036   ALT 52 12/28/2017 1412   ALT 59 (H) 12/14/2016 1339   BILITOT 0.4  03/23/2022 1036   BILITOT 0.3 12/28/2017 1412   BILITOT 0.43 12/14/2016 1339

## 2022-03-23 NOTE — Assessment & Plan Note (Signed)
The patient is a long-term cancer survivor Clinically, he is not symptomatic His CBC is completely normal and exam is normal I do not see a good reason to order repeat staging imaging studies in the absence of symptoms I will see him on a yearly basis for follow-up

## 2022-05-22 DIAGNOSIS — E119 Type 2 diabetes mellitus without complications: Secondary | ICD-10-CM | POA: Diagnosis not present

## 2022-05-22 DIAGNOSIS — H5203 Hypermetropia, bilateral: Secondary | ICD-10-CM | POA: Diagnosis not present

## 2022-05-22 DIAGNOSIS — H2513 Age-related nuclear cataract, bilateral: Secondary | ICD-10-CM | POA: Diagnosis not present

## 2022-05-22 DIAGNOSIS — H52223 Regular astigmatism, bilateral: Secondary | ICD-10-CM | POA: Diagnosis not present

## 2022-05-22 DIAGNOSIS — H524 Presbyopia: Secondary | ICD-10-CM | POA: Diagnosis not present

## 2022-06-05 DIAGNOSIS — M2550 Pain in unspecified joint: Secondary | ICD-10-CM | POA: Diagnosis not present

## 2022-06-05 DIAGNOSIS — Z6835 Body mass index (BMI) 35.0-35.9, adult: Secondary | ICD-10-CM | POA: Diagnosis not present

## 2022-06-09 ENCOUNTER — Encounter: Payer: Self-pay | Admitting: Infectious Diseases

## 2022-06-12 DIAGNOSIS — M5459 Other low back pain: Secondary | ICD-10-CM | POA: Diagnosis not present

## 2022-06-12 NOTE — Telephone Encounter (Signed)
Spoke with pt Needs his ADAP renewed- will link him to [facility] Also spoke with [employer]- he does not appear to be eligible for this but will re-eval after ADAP.

## 2022-06-19 DIAGNOSIS — M5459 Other low back pain: Secondary | ICD-10-CM | POA: Diagnosis not present

## 2022-06-26 DIAGNOSIS — M5459 Other low back pain: Secondary | ICD-10-CM | POA: Diagnosis not present

## 2022-07-03 DIAGNOSIS — M5459 Other low back pain: Secondary | ICD-10-CM | POA: Diagnosis not present

## 2022-07-11 DIAGNOSIS — M5459 Other low back pain: Secondary | ICD-10-CM | POA: Diagnosis not present

## 2022-08-01 ENCOUNTER — Telehealth: Payer: Self-pay

## 2022-08-01 NOTE — Patient Outreach (Signed)
  Care Coordination   Initial Visit Note   08/01/2022 Name: Jon Shannon MRN: [id number] DOB: 13-Feb-1966  Jon Shannon is a 57 y.o. year old male who sees [name], MD for primary care. I spoke with  Jon Shannon by phone today.  What matters to the patients health and wellness today?  Placed call to patient to review and offer [facility] care coordination program.  Patient reports that he is sick right now and would like a call back another day.     Goals Addressed   None     SDOH assessments and interventions completed:  No     Care Coordination Interventions:  No, not indicated   Follow up plan:  will call back this week    Encounter Outcome:  Pt. Visit Completed   Tomasa Rand, RN, BSN, CEN [place] Coordinator [phone]

## 2022-08-07 ENCOUNTER — Telehealth: Payer: Self-pay

## 2022-08-07 NOTE — Patient Outreach (Signed)
  Care Coordination   Initial Visit Note   08/07/2022 Name: Jon Shannon MRN: [id number] DOB: 06/24/1966  Jon Shannon is a 57 y.o. year old male who sees Jon Flow, MD for primary care. I spoke with  Jon Shannon by phone today.  What matters to the patients health and wellness today?  Placed call to patient today to review and offer [facility] case management program.  Patient reports his biggest concern today is joint pain. States 5/10,  back, both shoulders, ankle.  Patient reports that he takes ibuprofen for pain.  Patient also states that he is out of Flomax.    Goals Addressed               This Visit's Progress     COMPLETED: Joint pain (pt-stated)        Care Coordination Interventions: Reviewed provider established plan for pain management Discussed importance of adherence to all scheduled medical appointments Counseled on the importance of reporting any/all new or changed pain symptoms or management strategies to pain management provider Discussed use of relaxation techniques and/or diversional activities to assist with pain reduction (distraction, imagery, relaxation, massage, acupressure, TENS, heat, and cold application Reviewed with patient prescribed pharmacological and nonpharmacological pain relief strategies Reviewed use of OTC medications like tylenol and muscle rubs, or voltaren gels. Encouraged patient to call pharmacy to get a refill on his flomax.  Provided my contact information if patient needs assistance in the future.           SDOH assessments and interventions completed:  No     Care Coordination Interventions:  Yes, provided   Follow up plan: No further intervention required.   Encounter Outcome:  Pt. Visit Completed   Jon Rand, RN, BSN, CEN [facility] Coordinator [phone]

## 2022-08-16 ENCOUNTER — Other Ambulatory Visit (HOSPITAL_COMMUNITY)
Admission: RE | Admit: 2022-08-16 | Discharge: 2022-08-16 | Disposition: A | Discharge status: Home / Self Care | Payer: Medicare HMO | Source: Ambulatory Visit | Attending: Infectious Diseases | Admitting: Infectious Diseases

## 2022-08-16 ENCOUNTER — Other Ambulatory Visit (INDEPENDENT_AMBULATORY_CARE_PROVIDER_SITE_OTHER): Payer: Medicare HMO

## 2022-08-16 DIAGNOSIS — Z79899 Other long term (current) drug therapy: Secondary | ICD-10-CM

## 2022-08-16 DIAGNOSIS — B20 Human immunodeficiency virus [HIV] disease: Secondary | ICD-10-CM | POA: Diagnosis not present

## 2022-08-16 DIAGNOSIS — Z113 Encounter for screening for infections with a predominantly sexual mode of transmission: Secondary | ICD-10-CM | POA: Diagnosis not present

## 2022-08-16 NOTE — Addendum Note (Signed)
Addended by: Truddie Crumble on: 08/16/2022 10:52 AM   Modules accepted: Orders

## 2022-08-17 LAB — URINE CYTOLOGY ANCILLARY ONLY
Chlamydia: NEGATIVE
Comment: NEGATIVE
Comment: NORMAL
Neisseria Gonorrhea: NEGATIVE

## 2022-08-17 LAB — T-HELPER CELL (CD4) - (RCID CLINIC ONLY)
CD4 % Helper T Cell: 31 % — ABNORMAL LOW (ref 33–65)
CD4 T Cell Abs: 837 /uL (ref 400–1790)

## 2022-08-18 LAB — LIPID PANEL
Chol/HDL Ratio: 4.8 ratio (ref 0.0–5.0)
Cholesterol, Total: 139 mg/dL (ref 100–199)
HDL: 29 mg/dL — ABNORMAL LOW (ref >39)
LDL Chol Calc (NIH): 66 mg/dL (ref 0–99)
Triglycerides: 270 mg/dL — ABNORMAL HIGH (ref 0–149)
VLDL Cholesterol Cal: 44 mg/dL — ABNORMAL HIGH (ref 5–40)

## 2022-08-18 LAB — COMPREHENSIVE METABOLIC PANEL
ALT: 29 IU/L (ref 0–44)
AST: 22 IU/L (ref 0–40)
Albumin/Globulin Ratio: 2 (ref 1.2–2.2)
Albumin: 4.6 g/dL (ref 3.8–4.9)
Alkaline Phosphatase: 38 IU/L — ABNORMAL LOW (ref 44–121)
BUN/Creatinine Ratio: 24 — ABNORMAL HIGH (ref 9–20)
BUN: 23 mg/dL (ref 6–24)
Bilirubin Total: 0.2 mg/dL (ref 0.0–1.2)
CO2: 23 mmol/L (ref 20–29)
Calcium: 9.4 mg/dL (ref 8.7–10.2)
Chloride: 101 mmol/L (ref 96–106)
Creatinine, Ser: 0.96 mg/dL (ref 0.76–1.27)
Globulin, Total: 2.3 g/dL (ref 1.5–4.5)
Glucose: 139 mg/dL — ABNORMAL HIGH (ref 70–99)
Potassium: 4.6 mmol/L (ref 3.5–5.2)
Sodium: 141 mmol/L (ref 134–144)
Total Protein: 6.9 g/dL (ref 6.0–8.5)
eGFR: 92 mL/min/{1.73_m2} (ref >59)

## 2022-08-18 LAB — CBC
Hematocrit: 41.3 % (ref 37.5–51.0)
Hemoglobin: 14 g/dL (ref 13.0–17.7)
MCH: 29.8 pg (ref 26.6–33.0)
MCHC: 33.9 g/dL (ref 31.5–35.7)
MCV: 88 fL (ref 79–97)
Platelets: 256 10*3/uL (ref 150–450)
RBC: 4.7 x10E6/uL (ref 4.14–5.80)
RDW: 12.6 % (ref 11.6–15.4)
WBC: 6.8 10*3/uL (ref 3.4–10.8)

## 2022-08-18 LAB — HIV-1 RNA QUANT-NO REFLEX-BLD: HIV-1 RNA Viral Load: <20 copies/mL

## 2022-08-18 LAB — RPR: RPR Ser Ql: NONREACTIVE

## 2022-08-30 ENCOUNTER — Encounter: Payer: Self-pay | Admitting: Infectious Diseases

## 2022-08-30 ENCOUNTER — Ambulatory Visit (INDEPENDENT_AMBULATORY_CARE_PROVIDER_SITE_OTHER): Payer: Medicare HMO | Admitting: Infectious Diseases

## 2022-08-30 VITALS — BP 118/77 | HR 69 | Temp 97.9°F | Ht 73.0 in | Wt 261.9 lb

## 2022-08-30 DIAGNOSIS — B20 Human immunodeficiency virus [HIV] disease: Secondary | ICD-10-CM

## 2022-08-30 DIAGNOSIS — Z794 Long term (current) use of insulin: Secondary | ICD-10-CM

## 2022-08-30 DIAGNOSIS — E11621 Type 2 diabetes mellitus with foot ulcer: Secondary | ICD-10-CM | POA: Diagnosis not present

## 2022-08-30 DIAGNOSIS — F341 Dysthymic disorder: Secondary | ICD-10-CM

## 2022-08-30 DIAGNOSIS — R7989 Other specified abnormal findings of blood chemistry: Secondary | ICD-10-CM | POA: Diagnosis not present

## 2022-08-30 DIAGNOSIS — E781 Pure hyperglyceridemia: Secondary | ICD-10-CM

## 2022-08-30 DIAGNOSIS — L97519 Non-pressure chronic ulcer of other part of right foot with unspecified severity: Secondary | ICD-10-CM | POA: Diagnosis not present

## 2022-08-30 DIAGNOSIS — C8190 Hodgkin lymphoma, unspecified, unspecified site: Secondary | ICD-10-CM

## 2022-08-30 DIAGNOSIS — E1140 Type 2 diabetes mellitus with diabetic neuropathy, unspecified: Secondary | ICD-10-CM | POA: Diagnosis not present

## 2022-08-30 DIAGNOSIS — Z113 Encounter for screening for infections with a predominantly sexual mode of transmission: Secondary | ICD-10-CM

## 2022-08-30 DIAGNOSIS — Z87891 Personal history of nicotine dependence: Secondary | ICD-10-CM

## 2022-08-30 DIAGNOSIS — I10 Essential (primary) hypertension: Secondary | ICD-10-CM | POA: Diagnosis not present

## 2022-08-30 DIAGNOSIS — Z8571 Personal history of Hodgkin lymphoma: Secondary | ICD-10-CM

## 2022-08-30 MED ORDER — TRIUMEQ 600-50-300 MG PO TABS: 1.0000 | ORAL_TABLET | Freq: Every day | ORAL | 5 refills | Status: DC

## 2022-08-30 NOTE — Assessment & Plan Note (Signed)
He is doing well No change in his ART.  Offered to get him in for counseling, he defers.  Will see him back in 9 months.

## 2022-08-30 NOTE — Assessment & Plan Note (Signed)
Estimated Creatinine Clearance: 114.7 mL/min (by C-G formula based on SCr of 0.96 mg/dL). Will continue to watch.

## 2022-08-30 NOTE — Assessment & Plan Note (Signed)
Will get him in with Podiatry He appears to have excellent control.  Appreciate his PCP f/u.

## 2022-08-30 NOTE — Progress Notes (Signed)
Subjective:    Patient ID: Jon Shannon, male  DOB: Feb 03, 1966, 57 y.o.        MRN: [id number]   HPI 57 yo M with HIV+, previous stage IIa Hodgkin's lymphoma (2013), tx with ABVD 10-2011 with resultant neuropathy. He had onc f/u May 2021, clear.  Has DM2 as well from steroids. Severe hypertriglyceridemia (Trig 546 on 03-2019--> 105 06-2021). Has PCP in [place] Chancy Milroy, J) .  Taking 4g fish oil, fenofibrate, and pravastatin.  On triumeq.     A1C 5.3% (06-2021). Fsg have been in the 100s for the last month. He's not sure of his last one (has not had wellness visit yet).  Has recurrent wound on R foot- has had podiatry f/u.  Still has wound that will occas "bust open" and then heals back up.  Gets delivery food- [employer], [employer] cart, ect.  Has started back to gym- 30 min walking, planning his garden.  Has 2 new puppies.  No problems with ART.    Has had ophtho, got new glasses.    Helping take care of his elderly parents. They are living with him, lots of stress from this as well as from his nephew living with him.    Has started wearing pull-ups due to urine incontinence at night.   States he is depressed- has been seen at [facility] for 2 years. Then got into conflict with them and stopped. Continues on lexapro. Not interested in seeing counselor here. Doesn't trust that they would take care of him.   HIV 1 RNA Quant (Copies/mL)  Date Value  01/05/2022 Not Detected  07/19/2021 Not Detected  10/04/2020 Not Detected   HIV-1 RNA Viral Load (copies/mL)  Date Value  08/16/2022 <20   CD4 T Cell Abs (/uL)  Date Value  08/16/2022 837  01/05/2022 668  07/19/2021 647     Health Maintenance  Topic Date Due  . FOOT EXAM  09/20/2018  . OPHTHALMOLOGY EXAM  08/31/2020  . HEMOGLOBIN A1C  01/17/2022  . INFLUENZA VACCINE  02/28/2022  . COVID-19 Vaccine (7 - 2023-24 season) 03/31/2022  . COLONOSCOPY (Pts 45-61yr Insurance coverage will need to be confirmed)  05/02/2025  .  DTaP/Tdap/Td (4 - Td or Tdap) 12/20/2031  . Hepatitis C Screening  Completed  . HIV Screening  Completed  . Zoster Vaccines- Shingrix  Completed  . HPV VACCINES  Aged Out      Review of Systems  Constitutional:  Negative for chills, fever and weight loss.  Gastrointestinal:  Negative for constipation and diarrhea.  Genitourinary:  Positive for urgency.  Psychiatric/Behavioral:  Positive for depression.   Please see HPI. All other systems reviewed and negative.     Objective:  Physical Exam Vitals reviewed.  Constitutional:      Appearance: Normal appearance. He is obese.  HENT:     Mouth/Throat:     Mouth: Mucous membranes are moist.     Pharynx: No oropharyngeal exudate.  Eyes:     Extraocular Movements: Extraocular movements intact.     Pupils: Pupils are equal, round, and reactive to light.  Cardiovascular:     Rate and Rhythm: Normal rate and regular rhythm.     Pulses:          Dorsalis pedis pulses are 3+ on the left side.  Pulmonary:     Effort: Pulmonary effort is normal.     Breath sounds: Normal breath sounds.  Abdominal:     General: Bowel sounds are normal. There is  no distension.     Palpations: Abdomen is soft.     Tenderness: There is no abdominal tenderness.  Musculoskeletal:     Cervical back: Normal range of motion and neck supple.     Right lower leg: No edema.     Left lower leg: No edema.     Right foot: Normal range of motion. No deformity, bunion, Charcot foot, foot drop or prominent metatarsal heads.     Left foot: Normal range of motion. No deformity, bunion, Charcot foot, foot drop or prominent metatarsal heads.  Feet:     Right foot:     Protective Sensation: 2 sites tested.  2 sites sensed.     Skin integrity: Callus present.     Toenail Condition: Right toenails are abnormally thick.     Left foot:     Protective Sensation: 2 sites tested.  2 sites sensed.     Skin integrity: Callus present.     Toenail Condition: Left toenails are  abnormally thick.     Comments: Wound on R great toe is healed, no fluctuance. Non-tender. Sensation is pressure, not light touch. Prev chemo (also prob DM).   See photo.  Skin:    General: Skin is warm.  Neurological:     Mental Status: He is alert.        Assessment & Plan:

## 2022-08-30 NOTE — Assessment & Plan Note (Signed)
Offered to send him to counselor here [place].  He defers.

## 2022-08-30 NOTE — Assessment & Plan Note (Signed)
Appears to be improving.  Will have him seen by podiatry

## 2022-08-30 NOTE — Assessment & Plan Note (Signed)
Well controlled on his current rx.

## 2022-08-30 NOTE — Assessment & Plan Note (Signed)
Appreciate Dr Calton Dach f/u.

## 2022-08-30 NOTE — Assessment & Plan Note (Signed)
Improved Continue to watch.  No change in his current rx.

## 2022-08-31 ENCOUNTER — Encounter: Payer: Self-pay | Admitting: Infectious Diseases

## 2022-09-04 ENCOUNTER — Other Ambulatory Visit: Payer: Self-pay | Admitting: Infectious Diseases

## 2022-09-04 DIAGNOSIS — C8190 Hodgkin lymphoma, unspecified, unspecified site: Secondary | ICD-10-CM

## 2022-09-08 ENCOUNTER — Encounter: Payer: Self-pay | Admitting: Infectious Diseases

## 2022-09-15 ENCOUNTER — Ambulatory Visit: Payer: Medicare HMO | Admitting: Podiatry

## 2022-09-15 DIAGNOSIS — L84 Corns and callosities: Secondary | ICD-10-CM

## 2022-09-15 DIAGNOSIS — M79674 Pain in right toe(s): Secondary | ICD-10-CM

## 2022-09-15 DIAGNOSIS — M79675 Pain in left toe(s): Secondary | ICD-10-CM | POA: Diagnosis not present

## 2022-09-15 DIAGNOSIS — B351 Tinea unguium: Secondary | ICD-10-CM

## 2022-09-15 DIAGNOSIS — M2042 Other hammer toe(s) (acquired), left foot: Secondary | ICD-10-CM | POA: Diagnosis not present

## 2022-09-15 DIAGNOSIS — M2041 Other hammer toe(s) (acquired), right foot: Secondary | ICD-10-CM | POA: Diagnosis not present

## 2022-09-15 DIAGNOSIS — E1142 Type 2 diabetes mellitus with diabetic polyneuropathy: Secondary | ICD-10-CM | POA: Diagnosis not present

## 2022-09-15 NOTE — Progress Notes (Signed)
Subjective:  Patient ID: Jon Shannon, male    DOB: [birth year],  MRN: [id number]  Chief Complaint  Patient presents with   Wound Check    Diabetic ulcer of toe of right foot    57 y.o. male presents with concern for possible ulceration on the toe of the right foot.  He also has multiple areas of callus.  Also reports he is having difficulty trimming his own nails and is in need of new diabetic shoes and inserts.  He does have a history of type 2 diabetes with peripheral neuropathy.  Also has flatfoot deformity and bunion hammertoe deformity.  Past Medical History:  Diagnosis Date   DJD (degenerative joint disease)    Elevated serum creatinine 06/15/2014   Hepatitis A    resolved.    HIV INFECTION 11/30/2009   no history of opportunistic infection   Hodgkin's lymphoma ([place]) 11/09/2011   Hx of radiation therapy 04/03/12 -04/29/12   Hodgkin's disease -neck   Hyperlipidemia 08/07/2011   HYPERTENSION 11/30/2009   Hypertriglyceridemia    PTSD (post-traumatic stress disorder)    Rectal bleeding 09/04/2013   Weight loss 05/29/2013    Allergies  Allergen Reactions   Shrimp [Shellfish Allergy] Anaphylaxis    Lobster, too   Cefdinir Other (See Comments)    Oral ulcer, mild.     ROS: Negative except as per HPI above  Objective:  General: AAO x3, NAD  Dermatological: Area of preulcerative callus at the medial aspect of the hallux IPJ as well as some of the first metatarsophalangeal joint on the right foot.  Area of preulcerative callus on the medial aspect of the left hallux IPJ and plantar first MPJ as well.  No underlying ulceration is noted no active drainage no erythema edema or concern for infection bilaterally.  Nails are thickened elongated and dystrophic x 5 bilateral foot patient is unable to trim due to mobility and dystrophy issues.  Vascular:  Dorsalis Pedis artery and Posterior Tibial artery pedal pulses are 2/4 bilateral.  Capillary fill time < 3 sec to all digits.    Neruologic: Severely diminished sensation to light touch bilaterally protective sensation is absent.  Musculoskeletal: Hammertoe deformities present bilaterally.  Pes planus foot deformity  Gait: Unassisted, Nonantalgic.   No images are attached to the encounter.  Radiographs:  Deferred Assessment:   1. Pre-ulcerative calluses   2. Pain due to onychomycosis of toenails of both feet   3. Hammertoe, bilateral   4. DM type 2 with diabetic peripheral neuropathy ([place])      Plan:  Patient was evaluated and treated and all questions answered.  # Diabetes mellitus type 2 with peripheral neuropathy and preulcerative calluses as well as hammertoe deformity and pes planus Patient educated on diabetes. Discussed proper diabetic foot care and discussed risks and complications of disease. Educated patient in depth on reasons to return to the office immediately should he/she discover anything concerning or new on the feet. All questions answered. Discussed proper shoes as well.  -Recommend the patient be treated with diabetic shoes and liners which I believe will decrease formation of preulcerative calluses and help prevent ulcerations from presenting bilaterally at the plantar first MPJ. -Patient will call the [facility] office to set up a fitting/casting session for diabetic shoes and liners.  #Hyperkeratotic lesions/pre ulcerative calluses present bilateral hallux IPJ and plantar 1st mpj All symptomatic hyperkeratoses x 4 separate lesions were safely debrided with a sterile #10 blade to patient's level of comfort without incident. We discussed preventative  and palliative care of these lesions including supportive and accommodative shoegear, padding, prefabricated and custom molded accommodative orthoses, use of a pumice stone and lotions/creams daily.  #Onychomycosis with pain  -Nails palliatively debrided as below. -Educated on self-care  Procedure: Nail Debridement Rationale: Pain Type of  Debridement: manual, sharp debridement. Instrumentation: Nail nipper, rotary burr. Number of Nails: 10    Return in about 3 months (around 12/14/2022) for [facility].          Everitt Amber, DPM [facility]

## 2022-09-27 ENCOUNTER — Telehealth: Payer: Self-pay | Admitting: Podiatry

## 2022-09-27 NOTE — Telephone Encounter (Signed)
Lmom for patient to call back to schedule diabetic shoes measurement.

## 2022-10-03 ENCOUNTER — Ambulatory Visit: Payer: Medicare HMO

## 2022-10-03 DIAGNOSIS — E1142 Type 2 diabetes mellitus with diabetic polyneuropathy: Secondary | ICD-10-CM

## 2022-10-03 DIAGNOSIS — M2041 Other hammer toe(s) (acquired), right foot: Secondary | ICD-10-CM

## 2022-10-09 DIAGNOSIS — Z6835 Body mass index (BMI) 35.0-35.9, adult: Secondary | ICD-10-CM | POA: Diagnosis not present

## 2022-10-09 DIAGNOSIS — R519 Headache, unspecified: Secondary | ICD-10-CM | POA: Diagnosis not present

## 2022-10-16 DIAGNOSIS — R2681 Unsteadiness on feet: Secondary | ICD-10-CM | POA: Diagnosis not present

## 2022-10-16 DIAGNOSIS — R519 Headache, unspecified: Secondary | ICD-10-CM | POA: Diagnosis not present

## 2022-10-26 DIAGNOSIS — Z6835 Body mass index (BMI) 35.0-35.9, adult: Secondary | ICD-10-CM | POA: Diagnosis not present

## 2022-10-26 DIAGNOSIS — R519 Headache, unspecified: Secondary | ICD-10-CM | POA: Diagnosis not present

## 2022-10-26 NOTE — Progress Notes (Signed)
Patient presents to the office today for diabetic shoe and insole measuring.  Patient was measured with brannock device to determine size and width for 1 pair of extra depth shoes and foam casted for 3 pair of insoles.   ABN signed.   Documentation of medical necessity will be sent to patient's treating diabetic doctor to verify and sign.   Patient's diabetic provider: [name], MD   Shoes and insoles will be ordered at that time and patient will be notified for an appointment for fitting when they arrive.   Patient shoe selection-   1st   Shoe choice:   [id number] BROOKS   Shoe size ordered: 13

## 2022-11-15 DIAGNOSIS — H6991 Unspecified Eustachian tube disorder, right ear: Secondary | ICD-10-CM | POA: Diagnosis not present

## 2022-11-15 DIAGNOSIS — Z6836 Body mass index (BMI) 36.0-36.9, adult: Secondary | ICD-10-CM | POA: Diagnosis not present

## 2022-11-24 ENCOUNTER — Ambulatory Visit: Payer: Medicare HMO | Admitting: Neurology

## 2022-11-24 ENCOUNTER — Encounter: Payer: Self-pay | Admitting: Neurology

## 2022-11-24 VITALS — BP 122/67 | HR 68 | Ht 72.0 in | Wt 259.0 lb

## 2022-11-24 DIAGNOSIS — Z8571 Personal history of Hodgkin lymphoma: Secondary | ICD-10-CM | POA: Diagnosis not present

## 2022-11-24 DIAGNOSIS — R519 Headache, unspecified: Secondary | ICD-10-CM | POA: Diagnosis not present

## 2022-11-24 DIAGNOSIS — D849 Immunodeficiency, unspecified: Secondary | ICD-10-CM

## 2022-11-24 DIAGNOSIS — B20 Human immunodeficiency virus [HIV] disease: Secondary | ICD-10-CM
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
## 2022-11-25 ENCOUNTER — Telehealth: Payer: Self-pay | Admitting: Neurology

## 2022-11-25 ENCOUNTER — Encounter: Payer: Self-pay | Admitting: Neurology

## 2022-11-25 LAB — C-REACTIVE PROTEIN: CRP: <1 mg/L (ref 0–10)

## 2022-11-25 LAB — SEDIMENTATION RATE: Sed Rate: 5 mm/hr (ref 0–30)

## 2022-11-25 NOTE — Patient Instructions (Signed)
57 y.o. male here as requested by Lise Auer, MD for daily headaches, past medical history of syphilis in 2021 diagnosed for chlamydia history of trichomonas GC in 1990 admits to male and male partners, type 2 diabetes with hypertriglyceridemia, hyperlipidemia mixed, depression, ED, peripheral neuropathy due to chemotherapy, HIV positive Dr. Ninetta Lights, personal history of Hodgkin's lymphoma step 2 localized to the bilateral neck sees Dr. Emeline Darling such, disabled due to neuropathy depression and HIV  This is a patient without a history of migraines, with a severe headache for 7 to 9 weeks.  I do not believe this is a primary headache that this is secondary.  He had an MRI brain at [facility] which I cannot access, I have asked my office to call Dr. Welton Flakes his primary care stat Monday morning to get a copy of the report, I do not see it in epic or Care Everywhere or canopy.  I am concerned for this patient given his immunocompromise history.  I called Dr. Ninetta Lights and discussed with them, I think patient needs a lumbar puncture immediately, he refused to go to the emergency room even though I highly highly encouraged him I recommended it.  We were able to keep calling [facility] to try to get him in for a lumbar puncture that day, they were not able to get a minute patient declined going to the ED, we scheduled a lumbar puncture Monday at 115.  I discussed with Dr. Ninetta Lights all the different cerebrospinal fluid tests to order.  He was very imbalanced when I saw him, his head hurt quite a bit, I did not note any features that would make me think this is migraines or a primary headache disorder as above.  I think patient needs a lumbar puncture ASAP.  I advised him if his headache gets worse over the weekend or he develops fevers (which he did state he recently had) or altered mental status he definitely has to call 911.  I ordered ESR and CRP however this is highly unlikely temporal arteritis.  Extended visit,  calling [facility] imaging, calling Dr. Ninetta Lights, trying to get a copy of his MRI.  Orders Placed This Encounter  Procedures   DG FLUORO GUIDED LOC OF NEEDLE/CATH TIP FOR SPINAL INJECT LT   Sedimentation rate   C-reactive protein

## 2022-11-25 NOTE — Telephone Encounter (Signed)
Patient had an MRI of the brain at [facility].  It is not in epic or in "Care Everywhere" or in canopy.  We need this immediately because he is getting an LP on Monday at 115 and he may need imaging before they will do an LP.  Deborah or pod 4 can you please call Dr. Milta Deiters office and get a copy of the report of his MRI of the brain please, Dr. Lennette Bihari is the one who referred him here and did not provide the MRI report as far as I can see.  Need MRI of the brain report  stat Monday morning in case [facility] imaging needs it for the lumbar puncture and I would like to see it as well of course.

## 2022-11-27 ENCOUNTER — Ambulatory Visit
Admission: RE | Admit: 2022-11-27 | Discharge: 2022-11-27 | Disposition: A | Discharge status: Home / Self Care | Payer: Medicare HMO | Source: Ambulatory Visit | Attending: Neurology | Admitting: Neurology

## 2022-11-27 ENCOUNTER — Other Ambulatory Visit (HOSPITAL_COMMUNITY)
Admission: RE | Admit: 2022-11-27 | Discharge: 2022-11-27 | Disposition: A | Discharge status: Home / Self Care | Payer: Medicare HMO | Source: Ambulatory Visit | Attending: Neurology | Admitting: Neurology

## 2022-11-27 ENCOUNTER — Other Ambulatory Visit: Payer: Self-pay | Admitting: Neurology

## 2022-11-27 VITALS — BP 137/78 | HR 72

## 2022-11-27 DIAGNOSIS — D849 Immunodeficiency, unspecified: Secondary | ICD-10-CM

## 2022-11-27 DIAGNOSIS — B20 Human immunodeficiency virus [HIV] disease: Secondary | ICD-10-CM | POA: Diagnosis not present

## 2022-11-27 DIAGNOSIS — R519 Headache, unspecified: Secondary | ICD-10-CM | POA: Insufficient documentation

## 2022-11-27 DIAGNOSIS — Z8571 Personal history of Hodgkin lymphoma: Secondary | ICD-10-CM

## 2022-11-27 LAB — CSF CELL COUNT WITH DIFFERENTIAL: Lymphs, CSF: 97 % — ABNORMAL HIGH (ref 40–80)

## 2022-11-27 NOTE — Telephone Encounter (Signed)
Spoke with Catharine @ GI. MRI results viewable in PACS.

## 2022-11-27 NOTE — Discharge Instructions (Signed)

## 2022-11-27 NOTE — Telephone Encounter (Signed)
I also sent the MRI results to be scanned into the chart.

## 2022-11-27 NOTE — Telephone Encounter (Signed)
Results of MRI brain from 10/13/22 have been printed by referrals from [facility]. I also [employer] w/ GI to see if they need them.

## 2022-11-28 ENCOUNTER — Encounter: Payer: Self-pay | Admitting: Neurology

## 2022-11-28 LAB — CSF CULTURE W GRAM STAIN: SPECIMEN QUALITY:: ADEQUATE

## 2022-11-29 LAB — FUNGUS CULTURE W SMEAR

## 2022-11-29 LAB — CRYPTOCOCCAL AG, LTX SCR RFLX TITER: SPECIMEN QUALITY:: ADEQUATE

## 2022-11-30 ENCOUNTER — Other Ambulatory Visit: Payer: Self-pay | Admitting: *Deleted

## 2022-11-30 ENCOUNTER — Telehealth: Payer: Self-pay | Admitting: *Deleted

## 2022-11-30 DIAGNOSIS — Z8571 Personal history of Hodgkin lymphoma: Secondary | ICD-10-CM

## 2022-11-30 DIAGNOSIS — D849 Immunodeficiency, unspecified: Secondary | ICD-10-CM

## 2022-11-30 DIAGNOSIS — B20 Human immunodeficiency virus [HIV] disease: Secondary | ICD-10-CM

## 2022-11-30 DIAGNOSIS — R519 Headache, unspecified: Secondary | ICD-10-CM

## 2022-11-30 LAB — CYTOLOGY - NON PAP

## 2022-11-30 LAB — FUNGUS CULTURE W SMEAR

## 2022-11-30 NOTE — Telephone Encounter (Signed)
Called GI and spoke with Selena Batten. She reached out to liaison with [facility] and they are going to add the Lyme PCR. I placed the order in EPIC per Dr Lucia Gaskins as well.

## 2022-11-30 NOTE — Telephone Encounter (Addendum)
-----   Message from Anson Fret, MD sent at 11/29/2022 12:40 PM EDT ----- Can someone please call [facility] and ask them if we can add lyme pcr to mr Hauth's csf labs please?

## 2022-12-01 ENCOUNTER — Telehealth: Payer: Self-pay | Admitting: Infectious Diseases

## 2022-12-01 LAB — CSF CULTURE W GRAM STAIN: Result:: NO GROWTH

## 2022-12-01 LAB — CSF CELL COUNT WITH DIFFERENTIAL: Segmented Neutrophils-CSF: 0 % (ref 0–6)

## 2022-12-01 LAB — CRYPTOCOCCAL AG, LTX SCR RFLX TITER

## 2022-12-01 LAB — FUNGUS CULTURE W SMEAR

## 2022-12-01 NOTE — Telephone Encounter (Signed)
Will discuss with Path Recheck his MRI Check state arbovirus panel

## 2022-12-02 LAB — HERPES SIMPLEX VIRUS, TYPE 1 AND 2 DNA,QUAL,RT PCR: HSV 2 DNA: NOT DETECTED

## 2022-12-02 LAB — CSF CULTURE W GRAM STAIN

## 2022-12-02 LAB — CSF CELL COUNT WITH DIFFERENTIAL: RBC Count, CSF: 122 cells/uL — ABNORMAL HIGH

## 2022-12-03 ENCOUNTER — Other Ambulatory Visit: Payer: Self-pay | Admitting: Neurology

## 2022-12-03 ENCOUNTER — Telehealth: Payer: Self-pay | Admitting: Neurology

## 2022-12-03 DIAGNOSIS — Z8571 Personal history of Hodgkin lymphoma: Secondary | ICD-10-CM

## 2022-12-03 DIAGNOSIS — H539 Unspecified visual disturbance: Secondary | ICD-10-CM

## 2022-12-03 DIAGNOSIS — R519 Headache, unspecified: Secondary | ICD-10-CM

## 2022-12-03 DIAGNOSIS — D849 Immunodeficiency, unspecified: Secondary | ICD-10-CM

## 2022-12-03 DIAGNOSIS — B20 Human immunodeficiency virus [HIV] disease: Secondary | ICD-10-CM

## 2022-12-03 LAB — TEST AUTHORIZATION

## 2022-12-03 LAB — CRYPTOCOCCAL AG, LTX SCR RFLX TITER: MICRO NUMBER:: 14886386

## 2022-12-03 LAB — MYCOBACTERIA,CULT W/FLUOROCHROME SMEAR

## 2022-12-03 LAB — EPSTEIN BARR VIRUS DNA, QUANT RTPCR: EBV DNA, QN PCR: NOT DETECTED {Log_copies}/mL

## 2022-12-03 MED ORDER — DOXYCYCLINE MONOHYDRATE 100 MG PO TABS: 100.0000 mg | ORAL_TABLET | Freq: Two times a day (BID) | ORAL | 0 refills | Status: DC

## 2022-12-03 NOTE — Telephone Encounter (Signed)
Spoke with patient for quite some time today possibly 30 to 45 minutes, his LP results are coming back pretty benign, the only significant finding is elevated lymphocytes and cell count and in the cytology, however there is no mention of abnormal lymphocytes.  I am wondering if patient has some sort of viral meningitis.  Lyme can look like this so we have added Lyme disease to the CSF panel.  Also I will see if I can add arbovirus.  I am going to see him again this week and I am also going to send him to ophthalmology because he says his vision is very blurry and he has not had an exam since October 2023 have already reached out to Dr. Laruth Bouchard office and they will get him in this week.  I reordered MRI of the brain and have also ordered an MRI of the cervical spine because patient says that headaches sometimes are dependent on position of his neck and has had imbalance we really should check for lesions in the spinal cord or even myelopathy or other spinal cord lesions that could cause ataxia.  I am also asking Dr. Ninetta Lights if I could go ahead and start him on doxycycline he still has a headache he says that it is more front and in the back, not migrainous, blurry vision, he is getting very tired of having a headache, I have reordered imaging and will have him see the ophthalmologist this week as mentioned.  I will also see him again this week.  This phone note to Dr. Luna Kitchens so he can see the results to date of the LP  Recent Results (from the past 2160 hour(s))  Sedimentation rate     Status: None   Collection Time: 11/24/22 12:09 PM  Result Value Ref Range   Sed Rate 5 0 - 30 mm/hr  C-reactive protein     Status: None   Collection Time: 11/24/22 12:09 PM  Result Value Ref Range   CRP <1 0 - 10 mg/L  Cytology - non pap     Status: None   Collection Time: 11/27/22  2:52 PM  Result Value Ref Range   CYTOLOGY - NON GYN      CYTOLOGY - NON PAP CASE: [id number] PATIENT: Jon  Shannon Non-Gynecological Cytology Report     Clinical History: HIV. Specimen Submitted:  A. CEREBRAL SPINAL FLUID, SPINAL TAP:   FINAL MICROSCOPIC DIAGNOSIS: - No malignant cells identified  SPECIMEN ADEQUACY: Satisfactory for evaluation  DIAGNOSTIC COMMENTS: Lymphocytes are present  GROSS: Received is/are 2cc's of clear, colorless fluid. (EMH:emh) Prepared: Smears:  0 Concentration Method (Thin Prep):  1 Cell Block:  0 Additional Studies:  N/A.     Final Diagnosis performed by Holley Bouche, MD.   Electronically signed 11/30/2022 Technical component performed at [facility]. [facility], [address] [place], [place] [address].  Professional component performed at [facility], [address] [place], [place] [address].  Immunohistochemistry Technical component (if applicable) was performed at [facility]. [address], [place], [place] [address].   IMMUNOHISTOCHEMISTRY DISCLAIMER (if applicable): Some of these immunohistochemical stains may have been developed and the performance characteristics determine by [facility]. Some may not have been cleared or approved by the U.S. Food and Drug Administration. The FDA has determined that such clearance or approval is not necessary. This test is used for clinical purposes. It should not be regarded as investigational or for research. This  laboratory is certified under the Clinical Laboratory Improvement Amendments of 1988 (CLIA-88) as qualified to perform high complexity clinical laboratory testing.  The controls stained appropriately.   CSF culture     Status: None   Collection Time: 11/27/22  2:53 PM   Specimen: Lumbar Puncture; Cerebrospinal Fluid  Result Value Ref Range   MICRO NUMBER: [id number]    SPECIMEN QUALITY: Adequate    Source CEREBROSPINAL FLUID (CSF)    STATUS: FINAL    GRAM STAIN:      No epithelial cells seen No white blood  cells seen No organisms seen   Result: No Growth    COMMENT:      We received a cerebrospinal fluid with a request for aerobic and/or anaerobic bacterial culture. We performed a CSF culture. If this was not what you intended to order, please contact your local client service representative immediately so that we can  adjust our billing appropriately. You may also inquire about alternative or additional testing.   Protein, CSF     Status: Abnormal   Collection Time: 11/27/22  2:53 PM  Result Value Ref Range   Total Protein, CSF 76 (H) 15 - 45 mg/dL  Fungus culture w smear     Status: None (Preliminary result)   Collection Time: 11/27/22  2:53 PM   Specimen: Lumbar Puncture; Cerebrospinal Fluid  Result Value Ref Range   MICRO NUMBER: [id number]    SPECIMEN QUALITY: Adequate    Source: CEREBROSPINAL FLUID (CSF)    STATUS: PRELIMINARY    SMEAR: No fungal elements seen.    CULTURE: Culture in progress   VDRL, CSF     Status: None   Collection Time: 11/27/22  2:53 PM  Result Value Ref Range   VDRL Quant, CSF Nonreactive Nonreactive  CSF cell count with differential     Status: Abnormal   Collection Time: 11/27/22  2:53 PM  Result Value Ref Range   Color, CSF COLORLESS COLORLESS   Appearance, CSF CLEAR CLEAR   RBC Count, CSF 122 (H) 0 cells/uL   TOTAL NUCLEATED CELL 12 (H) 0 - 5 cells/uL   Segmented Neutrophils-CSF 0 0 - 6 %   Lymphs, CSF 97 (H) 40 - 80 %   Monocyte/Macrophage 3 (L) 15 - 45 %   Eosinophils, CSF 0 0 %   Basophils, % 0 0 %  Glucose, CSF     Status: Abnormal   Collection Time: 11/27/22  2:53 PM  Result Value Ref Range   Glucose, CSF 81 (H) 40 - 80 mg/dL  Cryptococcal Ag, Ltx Scr Rflx Titer     Status: None   Collection Time: 11/27/22  2:53 PM  Result Value Ref Range   MICRO NUMBER: [id number]    SPECIMEN QUALITY: Adequate    SOURCE: CEREBROSPINAL FLUID (CSF)    STATUS: FINAL    Cryptococcal Ag Screen Not Detected    COMMENT      Culture should be performed on the  initial positive sample in order to recover the causative organism for precise identification (C. neoformans vs. C. gattii) and potential susceptibility testing.  Malachi Carl Virus DNA, Quant RTPCR     Status: None   Collection Time: 11/27/22  2:53 PM  Result Value Ref Range   Source CSF    EBV DNA, QN PCR Not Detected copies/mL   EBV DNA, QN PCR Not Detected Log cps/mL    Comment: . Reference Range: Not Detected . For additional information, please refer to http://education.[facility].com/faq/CMVandEBVPCR (This link  is being provided for informational/ educational purposes only.) . This test was developed and its analytical performance characteristics have been determined by [employer], [place], [place]. It has not been cleared or approved by the U.S. Food and Drug Administration. This assay has been validated pursuant to the CLIA regulations and is used for clinical purposes. Marland Kitchen   MYCOBACTERIA, CULTURE, WITH FLUOROCHROME SMEAR     Status: None (Preliminary result)   Collection Time: 11/27/22  2:53 PM   Specimen: Lumbar Puncture; Cerebrospinal Fluid  Result Value Ref Range   MICRO NUMBER: [id number]    SPECIMEN QUALITY: Adequate    Source: CEREBROSPINAL FLUID (CSF)    STATUS: PRELIMINARY    SMEAR: CANCELED     Comment: Result canceled by the ancillary.   RESULT:      Culture results to follow. Final reports of negative cultures can be expected in approximately six weeks. Positive cultures are reported immediately. Specimen did not meet minimum volume requirement. No growth results should be interpreted with caution.  Herpes Simplex Virus, Type 1 and 2 DNA, Qualitative, Real-Time PCR     Status: None   Collection Time: 11/27/22  2:53 PM  Result Value Ref Range   HEPATITIS E ANTIBODY (IGG) CSF    HSV 1 DNA Not Detected Not Detected   HSV 2 DNA Not Detected Not Detected    Comment: . Marland Kitchen This test was developed and its analytical  performance characteristics have been determined by [facility] [place], [place]. It has not been cleared or approved by the U.S. Food and Drug Administration. This assay has been validated pursuant to the CLIA regulations and is used for clinical purposes. .   Anaerobic culture w Gram Stain     Status: None (Preliminary result)   Collection Time: 11/27/22  2:53 PM  Result Value Ref Range   MICRO NUMBER: [id number]    SPECIMEN QUALITY: Adequate    Source: CEREBROSPINAL FLUID (CSF)    STATUS: PRELIMINARY    GRAM STAIN: No organisms or white blood cells seen    ANA RESULT:      No anaerobes isolated to date, continuing incubation.  TEST AUTHORIZATION     Status: None   Collection Time: 11/27/22  2:53 PM  Result Value Ref Range   TEST NAME: BORRELIA BURGDORFERI DNA,    TEST CODE: [id number]    CLIENT CONTACT: ANGEL    REPORT ALWAYS MESSAGE SIGNATURE      Comment: . The laboratory testing on this patient was verbally requested or confirmed by the ordering physician or his or her authorized representative after contact with an employee of [employer]. Federal regulations require that we maintain on file written authorization for all laboratory testing.  Accordingly we are asking that the ordering physician or his or her authorized representative sign a copy of this report and promptly return it to the client service representative. . . Signature:____________________________________________________ . Please fax this signed page to [phone] or return it via your [employer] courier.

## 2022-12-03 NOTE — Telephone Encounter (Signed)
Left patient a message to discuss resuts to date snd imaging repeat, left a message  Pod4 fyi

## 2022-12-04 ENCOUNTER — Other Ambulatory Visit: Payer: Self-pay | Admitting: *Deleted

## 2022-12-04 ENCOUNTER — Telehealth: Payer: Self-pay | Admitting: Neurology

## 2022-12-04 DIAGNOSIS — H539 Unspecified visual disturbance: Secondary | ICD-10-CM

## 2022-12-04 DIAGNOSIS — R519 Headache, unspecified: Secondary | ICD-10-CM

## 2022-12-04 DIAGNOSIS — B20 Human immunodeficiency virus [HIV] disease: Secondary | ICD-10-CM

## 2022-12-04 DIAGNOSIS — Z8571 Personal history of Hodgkin lymphoma: Secondary | ICD-10-CM

## 2022-12-04 DIAGNOSIS — D849 Immunodeficiency, unspecified: Secondary | ICD-10-CM

## 2022-12-04 LAB — ENTEROVIRUS PANEL, CF, CSF
COXSACKIE A16 AB: <1:1 {titer}
COXSACKIE A2 AB: <1:1 {titer}
COXSACKIE A9 AB: <1:1 {titer}
Echovirus Ab Type 11: <1:1 {titer}
Echovirus Ab Type 30: <1:1 {titer}

## 2022-12-04 LAB — TEST AUTHORIZATION

## 2022-12-04 NOTE — Progress Notes (Signed)
Arbovirus panel added per v.o. Dr Lucia Gaskins. Spoke with Collotta at [facility] [phone]) who confirmed Lyme is pending and takes 3-4 business days. As soon as they see the Arbovirus order they will add it as well.

## 2022-12-04 NOTE — Telephone Encounter (Signed)
Referral sent to [facility]: Phone: [phone] Fax: [phone]

## 2022-12-04 NOTE — Telephone Encounter (Signed)
Late entry  Received a call from [facility] 453 PM stating that patient Jon Shannon glucose was high 81 (normal 40-80) and protein high 76 (normal 15-45) Chart review indicated Dr. Lucia Gaskins already spoke with patient regarding his results.   Dr. Teresa Coombs

## 2022-12-04 NOTE — Telephone Encounter (Signed)
Jon Shannon: [id number] exp. 12/04/22-01/03/23 sent to GI [phone]

## 2022-12-04 NOTE — Telephone Encounter (Signed)
See phone note for their phone call.

## 2022-12-07 ENCOUNTER — Encounter: Payer: Self-pay | Admitting: Neurology

## 2022-12-07 ENCOUNTER — Ambulatory Visit: Payer: Medicare HMO | Admitting: Neurology

## 2022-12-07 VITALS — BP 117/70 | HR 75 | Ht 72.0 in | Wt 263.0 lb

## 2022-12-07 DIAGNOSIS — H2513 Age-related nuclear cataract, bilateral: Secondary | ICD-10-CM | POA: Diagnosis not present

## 2022-12-07 DIAGNOSIS — R519 Headache, unspecified: Secondary | ICD-10-CM

## 2022-12-07 DIAGNOSIS — E119 Type 2 diabetes mellitus without complications: Secondary | ICD-10-CM | POA: Diagnosis not present

## 2022-12-07 DIAGNOSIS — H11041 Peripheral pterygium, stationary, right eye: Secondary | ICD-10-CM | POA: Diagnosis not present

## 2022-12-07 LAB — TEST AUTHORIZATION

## 2022-12-07 LAB — ENTEROVIRUS PANEL, CF, CSF
COXSACKIE A10 AB: <1:1 {titer}
COXSACKIE A4 AB: <1:1 {titer}
COXSACKIE A7 AB: <1:1 {titer}
Echovirus Ab Type 7: <1:1 {titer}
Echovirus Ab Type 9: <1:1 {titer}
Echovirus Ab, type 4: <1:1 {titer}

## 2022-12-07 LAB — ARBOVIRUS ANTIBODY PANEL (IGG, IGM), IFA, CSF
CALIFORNIA IGG: <1:1 {titer}
CALIFORNIA IGM: <1:1 {titer}
EASTERN EQUINE IGG: <1:1 {titer}
EASTERN EQUINE IGM: <1:1 {titer}
ST.LOUIS IGG: <1:1 {titer}
ST.LOUIS IGM: <1:1 {titer}
WESTERN EQUINE IGG: <1:1 {titer}
WESTERN EQUINE IGM: <1:1 {titer}

## 2022-12-07 NOTE — Progress Notes (Signed)
Orders for infusion written and signed by Dr Lucia Gaskins then given to [facility].   Depacon 1 gram IV x 1 Zofran 4 mg PO x 1  Solumedrol 125 mg IV x 2 Toradol 30 mg IV x 1

## 2022-12-07 NOTE — Progress Notes (Signed)
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   Coordination:  No dysmetria  Motor Observation:    No asymmetry, no atrophy, and no involuntary movements noted. Tone:    Normal muscle tone.     Strength:    Strength is V/V in the upper and lower limbs.      Sensation: intact to LT     Assessment/Plan:  57 y.o. male here as requested by Lise Auer, MD for daily headaches, past medical history of syphilis in 2021 diagnosed for chlamydia history of trichomonas GC in 1990 admits to male and male partners, type 2 diabetes with hypertriglyceridemia, hyperlipidemia mixed, depression, ED, peripheral neuropathy due to chemotherapy, HIV positive Dr. Ninetta Lights, personal history of  Hodgkin's lymphoma step 2 localized to the bilateral neck sees Dr. Emeline Darling such, disabled due to neuropathy depression and HIV. This is a patient without a history of migraines, with a severe headache for 7 to 9 weeks.  I do not believe this is a primary headache that this is secondary.  He had an MRI brain at [facility] in march which was normal, repeat MRI brain and cervical spine pending. LP performed and showed many leukocytes otherwise benign.  - I have been working Dr. Ninetta Lights, much appreciate my colleague, in infectious disease, and we performed an LP which has been unremarkable except for an increase in lymphocytes. All other testing (extensive) coming back negative. I believe this may be something viral,  aseptic meningitis, we are still waiting on all the tests.  - I sent him to ophthalmology urgently as well and spoke to Dr. Dione Booze this orning, normal ophthalmology exam. - MRI in march negative, repeat mri brain and c-spine - Headaches are slightly improving but still contonuous, he didn't sleep last night, he is extremely upset. We went through all the labs, appears to be a viral meningitis.  - I spoke to Dr. Ninetta Lights who has been wonderful and we brought him to get an infusio of depakote, solumedrol, toradol and zofran which helped exceptionally down to a 0/10. I warned patient the solumedrol mayincrease his glucose, needs to watch that but he was very happy when he left. If the headache returns I will perform nerve blocks told him to call us.    - started doxy. When completed may start a course of valtrex per dr hatcher. - told patient to call us with results and how he feels later, he may need to come back for another infusion or a nerve block  Recent Results (from the past 2160 hour(s))  Sedimentation rate     Status: None   Collection Time: 11/24/22 12:09 PM  Result Value Ref Range   Sed Rate 5 0 - 30 mm/hr  C-reactive protein     Status: None   Collection Time: 11/24/22 12:09 PM  Result  Value Ref Range   CRP <1 0 - 10 mg/L  Cytology - non pap     Status: None   Collection Time: 11/27/22  2:52 PM  Result Value Ref Range   CYTOLOGY - NON GYN      CYTOLOGY - NON PAP CASE: [id number] PATIENT: Jon Shannon Non-Gynecological Cytology Report     Clinical History: HIV. Specimen Submitted:  A. CEREBRAL SPINAL FLUID, SPINAL TAP:   FINAL MICROSCOPIC DIAGNOSIS: - No malignant cells identified  SPECIMEN ADEQUACY: Satisfactory for evaluation  DIAGNOSTIC COMMENTS: Lymphocytes are present  GROSS: Received is/are 2cc's of clear, colorless fluid. (EMH:emh) Prepared: Smears:  0 Concentration Method (Thin Prep):  1 Cell Block:  0 Additional Studies:  N/A.     Final Diagnosis performed by Holley Bouche, MD.   Electronically signed 11/30/2022 Technical component performed at [facility]. [facility], [address] [place], [place] [address].  Professional component performed at [facility], [address] [place], [place] [address].  Immunohistochemistry Technical component (if applicable) was performed at [facility]. [address], [address], [place], [place] [address].   IMMUNOHISTOCHEMISTRY DISCLAIMER (if applicable): Some of these immunohistochemical stains may have been developed and the performance characteristics determine by [facility]. Some may not have been cleared or approved by the U.S. Food and Drug Administration. The FDA has determined that such clearance or approval is not necessary. This test is used for clinical purposes. It should not be regarded as investigational or for research. This laboratory is certified under the Clinical Laboratory Improvement Amendments of 1988 (CLIA-88) as qualified to perform high complexity clinical laboratory testing.  The controls stained appropriately.   CSF culture     Status: None   Collection Time: 11/27/22  2:53 PM   Specimen: Lumbar  Puncture; Cerebrospinal Fluid  Result Value Ref Range   MICRO NUMBER: [id number]    SPECIMEN QUALITY: Adequate    Source CEREBROSPINAL FLUID (CSF)    STATUS: FINAL    GRAM STAIN:      No epithelial cells seen No white blood cells seen No organisms seen  Result: No Growth    COMMENT:      We received a cerebrospinal fluid with a request for aerobic and/or anaerobic bacterial culture. We performed a CSF culture. If this was not what you intended to order, please contact your local client service representative immediately so that we can  adjust our billing appropriately. You may also inquire about alternative or additional testing.   Protein, CSF     Status: Abnormal   Collection Time: 11/27/22  2:53 PM  Result Value Ref Range   Total Protein, CSF 76 (H) 15 - 45 mg/dL  Fungus culture w smear     Status: None (Preliminary result)   Collection Time: 11/27/22  2:53 PM   Specimen: Lumbar Puncture; Cerebrospinal Fluid  Result Value Ref Range   MICRO NUMBER: [id number]    SPECIMEN QUALITY: Adequate    Source: CEREBROSPINAL FLUID (CSF)    STATUS: PRELIMINARY    SMEAR: No fungal elements seen.    CULTURE: Culture in progress   VDRL, CSF     Status: None   Collection Time: 11/27/22  2:53 PM  Result Value Ref Range   VDRL Quant, CSF Nonreactive Nonreactive  CSF cell count with differential     Status: Abnormal   Collection Time: 11/27/22  2:53 PM  Result Value Ref Range   Color, CSF COLORLESS COLORLESS   Appearance, CSF CLEAR CLEAR   RBC Count, CSF 122 (H) 0 cells/uL   TOTAL NUCLEATED CELL 12 (H) 0 - 5 cells/uL   Segmented Neutrophils-CSF 0 0 - 6 %   Lymphs, CSF 97 (H) 40 - 80 %   Monocyte/Macrophage 3 (L) 15 - 45 %   Eosinophils, CSF 0 0 %   Basophils, % 0 0 %  Glucose, CSF     Status: Abnormal   Collection Time: 11/27/22  2:53 PM  Result Value Ref Range   Glucose, CSF 81 (H) 40 - 80 mg/dL  Cryptococcal Ag, Ltx Scr Rflx Titer     Status: None   Collection Time: 11/27/22  2:53 PM   Result Value Ref Range   MICRO NUMBER: [id number]    SPECIMEN QUALITY: Adequate    SOURCE: CEREBROSPINAL FLUID (CSF)    STATUS: FINAL    Cryptococcal Ag Screen Not Detected    COMMENT      Culture should be performed on the initial positive sample in order to recover the causative organism for precise identification (C. neoformans vs. C. gattii) and potential susceptibility testing.  Malachi Carl Virus DNA, Quant RTPCR     Status: None   Collection Time: 11/27/22  2:53 PM  Result Value Ref Range   Source CSF    EBV DNA, QN PCR Not Detected copies/mL   EBV DNA, QN PCR Not Detected Log cps/mL    Comment: . Reference Range: Not Detected . For additional information, please refer to http://education.questdiagnostics.com/faq/CMVandEBVPCR (This link is being provided for informational/ educational purposes only.) . This test was developed and its analytical performance characteristics have been determined by [employer] [facility], [place], [place]. It has not been cleared or approved by the U.S. Food and Drug Administration. This assay has been validated pursuant to the CLIA regulations and is used for clinical purposes. Marland Kitchen   MYCOBACTERIA, CULTURE, WITH FLUOROCHROME SMEAR     Status: None (Preliminary result)   Collection Time: 11/27/22  2:53 PM   Specimen: Lumbar Puncture; Cerebrospinal Fluid  Result Value Ref Range   MICRO NUMBER: [id number]    SPECIMEN QUALITY:  Adequate    Source: CEREBROSPINAL FLUID (CSF)    STATUS: PRELIMINARY    SMEAR: CANCELED     Comment: Result canceled by the ancillary.   RESULT:      Culture results to follow. Final reports of negative cultures can be expected in approximately six weeks. Positive cultures are reported immediately. Specimen did not meet minimum volume requirement. No growth results should be interpreted with caution.  Herpes Simplex Virus, Type 1 and 2 DNA, Qualitative, Real-Time PCR     Status: None   Collection Time: 11/27/22   2:53 PM  Result Value Ref Range   HEPATITIS E ANTIBODY (IGG) CSF    HSV 1 DNA Not Detected Not Detected   HSV 2 DNA Not Detected Not Detected    Comment: . Marland Kitchen This test was developed and its analytical performance characteristics have been determined by [facility] [place], [place]. It has not been cleared or approved by the U.S. Food and Drug Administration. This assay has been validated pursuant to the CLIA regulations and is used for clinical purposes. .   Anaerobic culture w Gram Stain     Status: None   Collection Time: 11/27/22  2:53 PM  Result Value Ref Range   MICRO NUMBER: [id number]    SPECIMEN QUALITY: Adequate    Source: CEREBROSPINAL FLUID (CSF)    STATUS: FINAL    GRAM STAIN: No organisms or white blood cells seen    ANA RESULT: No anaerobes isolated.   TEST AUTHORIZATION     Status: None   Collection Time: 11/27/22  2:53 PM  Result Value Ref Range   TEST NAME: BORRELIA BURGDORFERI DNA,    TEST CODE: [id number]    CLIENT CONTACT: ANGEL    REPORT ALWAYS MESSAGE SIGNATURE      Comment: . The laboratory testing on this patient was verbally requested or confirmed by the ordering physician or his or her authorized representative after contact with an employee of [employer]. Federal regulations require that we maintain on file written authorization for all laboratory testing.  Accordingly we are asking that the ordering physician or his or her authorized representative sign a copy of this report and promptly return it to the client service representative. . . Signature:____________________________________________________ . Please fax this signed page to [phone] or return it via your [employer] courier.   Borrelia burgdorferi DNA, Qualitative Real-Time PCR, Miscellaneous     Status: None   Collection Time: 11/27/22  2:53 PM  Result Value Ref Range   SOURCE: CSF    BORRELIA BURGDORFERI DNA, QL REAL TIME PCR, MISC Not  Detected Not Detected   COMMENT see note     Comment: . This test detects but does not distinguish between B. burgdorferi and B. americana. . The Code of [place], Article 1 of Chapter 5 of Title 32.1, section 32.1-137.06, requires that the following language must be included on every Lyme disease test report issued by a [place] laboratory: Patients undergoing a Lyme disease test should be aware that Lyme disease tests vary and may produce results that are inaccurate. This means a patient may not be able to rely on a positive or negative result. Health care providers are encouraged to discuss Lyme disease test results with the patient for whom the test was ordered. . . For additional information, please refer to https://education.questdiagnostics.com/faq/faq224 (This link is being provided for informational/ educational purposes only.) . This test was developed and its analytical performance characteristics have been determined by [facility] [place], [place]. It has not been cleared or appro ved by the U.S. Food and Drug Administration. This assay has been validated pursuant to the CLIA regulations and is used for clinical purposes. .   Enterovirus Panel, CF, CSF     Status: None   Collection Time: 11/27/22  2:53 PM  Result Value Ref Range   COXSACKIE A2 AB <1:1    COXSACKIE A4 AB <1:1    COXSACKIE A7 AB <1:1    COXSACKIE A9 AB <1:1    COXSACKIE A10 AB <1:1    COXSACKIE A16 AB <1:1     Comment: . REFERENCE RANGE:  <1:1 . INTERPRETIVE CRITERIA:                   <1:1  Antibody Not Detected             > or = 1:1  Antibody Detected . Diagnosis of infections of the central nervous system is accomplished by demonstrating the presence of intrathecally-produced specific antibody. Interpretation of results may be complicated by low antibody levels found in CSF, passive transfer of antibody from blood, and contamination via bloody taps. . This  test was developed and its analytical performance characteristics have been determined by [employer]. It has not been cleared or approved by FDA. This assay has been validated pursuant to the CLIA regulations and is used for clinical purposes. .    Echovirus Ab, type 4 <1:1    Echovirus Ab Type 7 <1:1    Echovirus Ab Type 9 <1:1    Echovirus Ab Type 11 <1:1    Echovirus Ab Type 30 <1:1     Comment: . REFERENCE RANGE:  <1:1 . INTERPRETIVE CRITERIA:                   <1:1  Antibody Not Detected             > or = 1:1  Antibody Detected . Diagnosis of infections of the central nervous system can be accomplished by demonstrating the presence of intrathecally-produced specific antibody. Interpretation of results may be complicated by low antibody levels found in CSF, passive transfer of antibody from blood, and contamination via bloody taps. . This test was developed and its analytical performance characteristics have been determined by [employer]. It has not been cleared or approved by FDA. This assay has been validated pursuant to the CLIA regulations and is used for clinical purposes. .   TEST AUTHORIZATION     Status: None   Collection Time: 11/27/22  2:53 PM  Result Value Ref Range   TEST NAME: ARBOVIRUS ANTIBODY PANEL, IFA    TEST CODE: [id number]    CLIENT CONTACT: BETHANY CULBERTSON    REPORT ALWAYS MESSAGE SIGNATURE      Comment: . The laboratory testing on this patient was verbally requested or confirmed by the ordering physician or his or her authorized representative after contact with an employee of [employer]. Federal regulations require that we maintain on file written authorization for all laboratory testing.  Accordingly we are asking that the ordering physician or his or her authorized representative sign a copy of this report and promptly return it to the client service  representative. . . Signature:____________________________________________________ . Please fax this signed page to [phone] or return it via your [employer] courier.       Cc: Lise Auer, MD,  Lise Auer, MD, Dr. Bertis Ruddy, Dr. Roma Schanz, MD  [facility] [address] [place], [place] [address]  Phone [phone] Fax [phone]  I spent over 60 minutes of face-to-face and non-face-to-face time with patient on the  1. Acute intractable headache, unspecified headache type     diagnosis.  This included previsit chart review, lab review, study review, order entry, electronic health record documentation, patient education on the different diagnostic and therapeutic options, counseling and coordination of care, risks and benefits of management, compliance, or risk factor reduction

## 2022-12-11 LAB — FUNGUS CULTURE W SMEAR
MICRO NUMBER:: 14886388
SMEAR:: NONE SEEN

## 2022-12-11 LAB — HERPES SIMPLEX VIRUS, TYPE 1 AND 2 DNA,QUAL,RT PCR

## 2022-12-11 LAB — ANAEROBIC CULTURE W GRAM STAIN
MICRO NUMBER:: 14892102
SPECIMEN QUALITY:: ADEQUATE

## 2022-12-11 LAB — TEST AUTHORIZATION

## 2022-12-11 LAB — CSF CELL COUNT WITH DIFFERENTIAL

## 2022-12-11 LAB — VDRL, CSF: VDRL Quant, CSF: NONREACTIVE

## 2022-12-11 LAB — MYCOBACTERIA,CULT W/FLUOROCHROME SMEAR

## 2022-12-18 ENCOUNTER — Ambulatory Visit (INDEPENDENT_AMBULATORY_CARE_PROVIDER_SITE_OTHER): Payer: Medicare HMO | Admitting: Podiatry

## 2022-12-18 DIAGNOSIS — M79674 Pain in right toe(s): Secondary | ICD-10-CM | POA: Diagnosis not present

## 2022-12-18 DIAGNOSIS — M79675 Pain in left toe(s): Secondary | ICD-10-CM | POA: Diagnosis not present

## 2022-12-18 DIAGNOSIS — B351 Tinea unguium: Secondary | ICD-10-CM

## 2022-12-18 DIAGNOSIS — M2042 Other hammer toe(s) (acquired), left foot: Secondary | ICD-10-CM

## 2022-12-18 DIAGNOSIS — L84 Corns and callosities: Secondary | ICD-10-CM | POA: Diagnosis not present

## 2022-12-18 DIAGNOSIS — M2041 Other hammer toe(s) (acquired), right foot: Secondary | ICD-10-CM

## 2022-12-18 DIAGNOSIS — E1142 Type 2 diabetes mellitus with diabetic polyneuropathy: Secondary | ICD-10-CM

## 2022-12-18 NOTE — Progress Notes (Signed)
Subjective:  Patient ID: Jon Shannon, male    DOB: February 02, 1966,  MRN: [id number]  Chief Complaint  Patient presents with   Nail Problem    Diabetic Foot Care- Patient is taking oral medication and injection for diabetes control.    Foot Orthotics    Picking up diabetic shoes     57 y.o. male presents with concern for possible ulceration on the toe of the right foot.  He also has multiple areas of callus.  Also reports he is having difficulty trimming his own nails and is in need of new diabetic shoes and inserts.  He does have a history of type 2 diabetes with peripheral neuropathy.  Also has flatfoot deformity and bunion hammertoe deformity.  Past Medical History:  Diagnosis Date   Anxiety    Depression    Diabetes (HCC)    type II from chemotherapy   DJD (degenerative joint disease)    Elevated serum creatinine 06/15/2014   Fracture    every metatarsal in R foot, and 2nd metatarsal in L foot while walking   Hepatitis A    resolved.    HIV INFECTION 11/30/2009   no history of opportunistic infection   Hodgkin's lymphoma (HCC) 11/09/2011   Hx of radiation therapy 04/03/12 -04/29/12   Hodgkin's disease -neck   Hyperlipidemia 08/07/2011   HYPERTENSION 11/30/2009   Hypertriglyceridemia    Memory difficulty    "chemo brain" long and short term memory come and go   Neuropathy    from chemo and diabetes   PTSD (post-traumatic stress disorder)    Rectal bleeding 09/04/2013   Weight loss 05/29/2013    Allergies  Allergen Reactions   Shrimp [Shellfish Allergy] Anaphylaxis    Lobster, too   Cefdinir Other (See Comments)    Oral ulcer, mild.     ROS: Negative except as per HPI above  Objective:  General: AAO x3, NAD  Dermatological: Area of preulcerative callus at the medial aspect of the hallux IPJ as well as some of the first metatarsophalangeal joint on the right foot.  Area of preulcerative callus on the medial aspect of the left hallux IPJ and plantar first MPJ as  well.  No underlying ulceration is noted no active drainage no erythema edema or concern for infection bilaterally.  Nails are thickened elongated and dystrophic x 5 bilateral foot patient is unable to trim due to mobility and dystrophy issues.  Vascular:  Dorsalis Pedis artery and Posterior Tibial artery pedal pulses are 2/4 bilateral.  Capillary fill time < 3 sec to all digits.   Neruologic: Severely diminished sensation to light touch bilaterally protective sensation is absent.  Musculoskeletal: Hammertoe deformities present bilaterally.  Pes planus foot deformity  Gait: Unassisted, Nonantalgic.   No images are attached to the encounter.  Radiographs:  Deferred Assessment:   1. Pre-ulcerative calluses   2. Pain due to onychomycosis of toenails of both feet   3. Hammertoe, bilateral   4. DM type 2 with diabetic peripheral neuropathy (HCC)       Plan:  Patient was evaluated and treated and all questions answered.  # Diabetes mellitus type 2 with peripheral neuropathy and preulcerative calluses as well as hammertoe deformity and pes planus Patient educated on diabetes. Discussed proper diabetic foot care and discussed risks and complications of disease. Educated patient in depth on reasons to return to the office immediately should he/she discover anything concerning or new on the feet. All questions answered. Discussed proper shoes as well.  -Pt  picked up diabetic shoes and liners   #Hyperkeratotic lesions/pre ulcerative calluses present bilateral hallux IPJ and plantar 1st mpj All symptomatic hyperkeratoses x 4 separate lesions were safely debrided with a sterile #10 blade to patient's level of comfort without incident. We discussed preventative and palliative care of these lesions including supportive and accommodative shoegear, padding, prefabricated and custom molded accommodative orthoses, use of a pumice stone and lotions/creams daily.  #Onychomycosis with pain  -Nails  palliatively debrided as below. -Educated on self-care  Procedure: Nail Debridement Rationale: Pain Type of Debridement: manual, sharp debridement. Instrumentation: Nail nipper, rotary burr. Number of Nails: 10    Return if symptoms worsen or fail to improve.          Corinna Gab, DPM [facility] / [facility]

## 2022-12-19 ENCOUNTER — Ambulatory Visit
Admission: RE | Admit: 2022-12-19 | Discharge: 2022-12-19 | Disposition: A | Discharge status: Home / Self Care | Payer: Medicare HMO | Source: Ambulatory Visit | Attending: Neurology | Admitting: Neurology

## 2022-12-19 ENCOUNTER — Telehealth: Payer: Self-pay | Admitting: Neurology

## 2022-12-19 DIAGNOSIS — R519 Headache, unspecified: Secondary | ICD-10-CM

## 2022-12-19 DIAGNOSIS — D849 Immunodeficiency, unspecified: Secondary | ICD-10-CM

## 2022-12-19 DIAGNOSIS — H539 Unspecified visual disturbance: Secondary | ICD-10-CM

## 2022-12-19 DIAGNOSIS — B20 Human immunodeficiency virus [HIV] disease: Secondary | ICD-10-CM

## 2022-12-19 DIAGNOSIS — Z8571 Personal history of Hodgkin lymphoma: Secondary | ICD-10-CM

## 2022-12-19 MED ORDER — GADOPICLENOL 0.5 MMOL/ML IV SOLN
10.0000 mL | Freq: Once | INTRAVENOUS | Status: AC | PRN
  Administered 2022-12-19: 10 mL via INTRAVENOUS

## 2022-12-19 NOTE — Telephone Encounter (Signed)
LVM informing pt of cancellation of 12/21/22 appt. Per Dr. Lucia Gaskins, "we have to cacel this appointment, we are backed up after losing multiple providers on maternity leave, if his headache is back again we can mychart and figure out a medication to start for him."

## 2022-12-21 ENCOUNTER — Telehealth: Payer: Self-pay | Admitting: *Deleted

## 2022-12-21 ENCOUNTER — Ambulatory Visit: Payer: Medicare HMO | Admitting: Family Medicine

## 2022-12-21 ENCOUNTER — Other Ambulatory Visit: Payer: Self-pay | Admitting: Neurology

## 2022-12-21 MED ORDER — VALACYCLOVIR HCL 1 G PO TABS: 1000.0000 mg | ORAL_TABLET | Freq: Two times a day (BID) | ORAL | 0 refills | Status: DC

## 2022-12-21 NOTE — Telephone Encounter (Signed)
Spoke to patient gave MRI brian and cervical results Pt states still having headaches  for the last week Pt states the headache is a little better today. Advised patient to go to ED or urgent to get evaluated Pt refused to go Pt states long waits and bill is too expensive. Spoke to Dr Lucia Gaskins will start patient on valtex wanted to speak to Dr Ninetta Lights first to see the appropriate  dosage of medication. Dr Lucia Gaskins wanted me to make f/u  appointment for him to come back in  sept  Spoke to patient gave plan of care going forward made f/u appointment 04/2023 Pt expressed understanding  and thanked me for calling

## 2022-12-21 NOTE — Telephone Encounter (Signed)
Per dr hatcher ordered 1g bid valtrex for 2 weeks thanks

## 2022-12-26 LAB — CSF CELL COUNT WITH DIFFERENTIAL: Eosinophils, CSF: 0 %

## 2022-12-26 LAB — MYCOBACTERIA,CULT W/FLUOROCHROME SMEAR: SPECIMEN QUALITY:: ADEQUATE

## 2022-12-26 LAB — ANAEROBIC CULTURE W GRAM STAIN: GRAM STAIN:: NONE SEEN

## 2022-12-26 LAB — FUNGUS CULTURE W SMEAR: SPECIMEN QUALITY:: ADEQUATE

## 2022-12-26 LAB — BORRELIA BURGDORFERI DNA, QUALITATIVE REAL-TIME PCR, MISC: BORRELIA BURGDORFERI DNA, QL REAL TIME PCR, MISC: NOT DETECTED

## 2022-12-26 LAB — CRYPTOCOCCAL AG, LTX SCR RFLX TITER: Cryptococcal Ag Screen: NOT DETECTED

## 2022-12-26 NOTE — Telephone Encounter (Signed)
Noted Dr Lucia Gaskins placed order for patient

## 2022-12-28 ENCOUNTER — Encounter: Payer: Self-pay | Admitting: *Deleted

## 2023-01-11 LAB — CSF CULTURE W GRAM STAIN: MICRO NUMBER:: 14886387

## 2023-01-11 LAB — TEST AUTHORIZATION

## 2023-01-11 LAB — CRYPTOCOCCAL AG, LTX SCR RFLX TITER

## 2023-01-11 LAB — CSF CELL COUNT WITH DIFFERENTIAL
Basophils, %: 0 %
Monocyte/Macrophage: 3 % — ABNORMAL LOW (ref 15–45)
TOTAL NUCLEATED CELL: 12 cells/uL — ABNORMAL HIGH (ref 0–5)

## 2023-01-11 LAB — HERPES SIMPLEX VIRUS, TYPE 1 AND 2 DNA,QUAL,RT PCR: HSV 1 DNA: NOT DETECTED

## 2023-01-11 LAB — MYCOBACTERIA,CULT W/FLUOROCHROME SMEAR: MICRO NUMBER:: 14886389

## 2023-01-11 LAB — ANAEROBIC CULTURE W GRAM STAIN

## 2023-01-11 LAB — GLUCOSE, CSF: Glucose, CSF: 81 mg/dL — ABNORMAL HIGH (ref 40–80)

## 2023-01-11 LAB — BORRELIA BURGDORFERI DNA, QUALITATIVE REAL-TIME PCR, MISC

## 2023-01-11 LAB — EPSTEIN BARR VIRUS DNA, QUANT RTPCR: EBV DNA, QN PCR: NOT DETECTED {copies}/mL

## 2023-01-11 LAB — PROTEIN, CSF: Total Protein, CSF: 76 mg/dL — ABNORMAL HIGH (ref 15–45)

## 2023-01-25 ENCOUNTER — Encounter: Payer: Self-pay | Admitting: Neurology

## 2023-01-30 NOTE — Telephone Encounter (Signed)
He says they have resolved, great news! Thank you for all your help!

## 2023-01-31 NOTE — Telephone Encounter (Signed)
You are the best Dr. Ninetta Lights. No, thank YOU !!!! Sheralyn Boatman. Have a happy 4th !!

## 2023-02-02 ENCOUNTER — Encounter: Payer: Self-pay | Admitting: Infectious Diseases

## 2023-03-27 ENCOUNTER — Inpatient Hospital Stay: Payer: Medicare HMO | Attending: Internal Medicine

## 2023-03-27 ENCOUNTER — Inpatient Hospital Stay: Payer: Medicare HMO | Admitting: Hematology and Oncology

## 2023-03-27 ENCOUNTER — Encounter: Payer: Self-pay | Admitting: Hematology and Oncology

## 2023-03-27 VITALS — BP 133/84 | HR 75 | Temp 98.6°F | Resp 18 | Ht 72.0 in | Wt 266.8 lb

## 2023-03-27 DIAGNOSIS — E669 Obesity, unspecified: Secondary | ICD-10-CM | POA: Diagnosis not present

## 2023-03-27 DIAGNOSIS — Z8571 Personal history of Hodgkin lymphoma: Secondary | ICD-10-CM

## 2023-03-27 DIAGNOSIS — Z08 Encounter for follow-up examination after completed treatment for malignant neoplasm: Secondary | ICD-10-CM

## 2023-03-27 DIAGNOSIS — Z881 Allergy status to other antibiotic agents status: Secondary | ICD-10-CM | POA: Diagnosis not present

## 2023-03-27 DIAGNOSIS — C8191 Hodgkin lymphoma, unspecified, lymph nodes of head, face, and neck: Secondary | ICD-10-CM | POA: Diagnosis not present

## 2023-03-27 DIAGNOSIS — Z91013 Allergy to seafood: Secondary | ICD-10-CM | POA: Insufficient documentation

## 2023-03-27 LAB — CMP (CANCER CENTER ONLY)
ALT: 36 U/L (ref 0–44)
AST: 25 U/L (ref 15–41)
Albumin: 4.3 g/dL (ref 3.5–5.0)
Alkaline Phosphatase: 31 U/L — ABNORMAL LOW (ref 38–126)
Anion gap: 7 (ref 5–15)
BUN: 17 mg/dL (ref 6–20)
CO2: 28 mmol/L (ref 22–32)
Calcium: 9.3 mg/dL (ref 8.9–10.3)
Chloride: 106 mmol/L (ref 98–111)
Creatinine: 0.93 mg/dL (ref 0.61–1.24)
GFR, Estimated: >60 mL/min (ref >60)
Glucose, Bld: 168 mg/dL — ABNORMAL HIGH (ref 70–99)
Potassium: 3.9 mmol/L (ref 3.5–5.1)
Sodium: 141 mmol/L (ref 135–145)
Total Bilirubin: 0.3 mg/dL (ref 0.3–1.2)
Total Protein: 6.6 g/dL (ref 6.5–8.1)

## 2023-03-27 LAB — CBC WITH DIFFERENTIAL (CANCER CENTER ONLY)
Abs Immature Granulocytes: 0.03 10*3/uL (ref 0.00–0.07)
Basophils Absolute: 0 10*3/uL (ref 0.0–0.1)
Basophils Relative: 1 %
Eosinophils Absolute: 0.1 10*3/uL (ref 0.0–0.5)
Eosinophils Relative: 1 %
HCT: 40.4 % (ref 39.0–52.0)
Hemoglobin: 13.7 g/dL (ref 13.0–17.0)
Immature Granulocytes: 1 %
Lymphocytes Relative: 40 %
Lymphs Abs: 2.5 10*3/uL (ref 0.7–4.0)
MCH: 30.7 pg (ref 26.0–34.0)
MCHC: 33.9 g/dL (ref 30.0–36.0)
MCV: 90.6 fL (ref 80.0–100.0)
Monocytes Absolute: 0.5 10*3/uL (ref 0.1–1.0)
Monocytes Relative: 7 %
Neutro Abs: 3.1 10*3/uL (ref 1.7–7.7)
Neutrophils Relative %: 50 %
Platelet Count: 202 10*3/uL (ref 150–400)
RBC: 4.46 MIL/uL (ref 4.22–5.81)
RDW: 11.7 % (ref 11.5–15.5)
WBC Count: 6.1 10*3/uL (ref 4.0–10.5)
nRBC: 0 % (ref 0.0–0.2)

## 2023-03-27 NOTE — Assessment & Plan Note (Signed)
The patient is a long-term cancer survivor Clinically, he is not symptomatic His CBC is completely normal  I do not see a good reason to order repeat staging imaging studies in the absence of symptoms We discussed possible discontinuation of long-term follow-up but the patient would like to return here for few more years for close observation I will see him next year for further follow-up

## 2023-03-27 NOTE — Assessment & Plan Note (Signed)
He is undergoing a lot of stress and not able to exercise I encouraged the patient to start exercising again once his stress level improves and his social situation improves

## 2023-03-27 NOTE — Progress Notes (Signed)
[facility] OFFICE PROGRESS NOTE  Patient Care Team: Lise Auer, MD as PCP - General (Family Medicine) Ginnie Smart, MD as PCP - Infectious Diseases (Infectious Diseases) Christia Reading, MD (Otolaryngology) Lise Auer, MD as Referring Physician (Family Medicine)  ASSESSMENT & PLAN:  History of hodgkin's lymphoma The patient is a long-term cancer survivor Clinically, he is not symptomatic His CBC is completely normal  I do not see a good reason to order repeat staging imaging studies in the absence of symptoms We discussed possible discontinuation of long-term follow-up but the patient would like to return here for few more years for close observation I will see him next year for further follow-up  Obesity, Class II, BMI 35-39.9 He is undergoing a lot of stress and not able to exercise I encouraged the patient to start exercising again once his stress level improves and his social situation improves  No orders of the defined types were placed in this encounter.   All questions were answered. The patient knows to call the clinic with any problems, questions or concerns. The total time spent in the appointment was 25 minutes encounter with patients including review of chart and various tests results, discussions about plan of care and coordination of care plan   Artis Delay, MD 03/27/2023 12:36 PM  INTERVAL HISTORY: Please see below for problem oriented charting. 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 his mother was recently sick and hospitalized for severe dehydration secondary to food poisoning He is undergoing a lot of stress and not able to work and exercise on a consistent basis He has not been able to sleep well He denies recent infection no new lymphadenopathy  REVIEW OF SYSTEMS:   Constitutional: Denies fevers, chills or abnormal weight loss Eyes: Denies  blurriness of vision Ears, nose, mouth, throat, and face: Denies mucositis or sore throat Respiratory: Denies cough, dyspnea or wheezes Cardiovascular: Denies palpitation, chest discomfort or lower extremity swelling Gastrointestinal:  Denies nausea, heartburn or change in bowel habits Skin: Denies abnormal skin rashes Lymphatics: Denies new lymphadenopathy or easy bruising Neurological:Denies numbness, tingling or new weaknesses Behavioral/Psych: Mood is stable, no new changes  All other systems were reviewed with the patient and are negative.  I have reviewed the past medical history, past surgical history, social history and family history with the patient and they are unchanged from previous note.  ALLERGIES:  is allergic to shrimp [shellfish allergy] and cefdinir.  MEDICATIONS:  Current Outpatient Medications  Medication Sig Dispense Refill   abacavir-dolutegravir-lamiVUDine (TRIUMEQ) 600-50-300 MG tablet TAKE 1 TABLET BY MOUTH DAILY WITH BREAKFAST 90 tablet 3   aspirin 81 MG tablet Take 81 mg by mouth daily.     B Complex-C (B-COMPLEX WITH VITAMIN C) tablet Take 2 tablets by mouth daily.      bisoprolol-hydrochlorothiazide (ZIAC) 5-6.25 MG tablet Take 1 tablet by mouth daily.     Chromium Picolinate (CHROMIUM PICOLATE PO) Take 2 tablets by mouth 2 (two) times daily.      CINNAMON PO Take 2 capsules by mouth 2 (two) times daily.      Dulaglutide (TRULICITY) 1.5 MG/0.5ML SOPN Inject 3 mg into the skin once a week.     escitalopram (LEXAPRO) 20 MG tablet Take 20 mg by mouth daily.   0   fenofibrate 160 MG tablet Take 160 mg by mouth at bedtime.     fish oil-omega-3  fatty acids 1000 MG capsule Take 2 g by mouth 2 (two) times daily. Two capsules BID     GARLIC OIL PO Take 2 tablets by mouth daily.      INVOKAMET [id number] MG TABS Take 1 tablet by mouth daily.     lisinopril (PRINIVIL,ZESTRIL) 5 MG tablet Take 5 mg by mouth daily.     loratadine (CLARITIN) 10 MG tablet Take 20 mg by  mouth daily.     MAGNESIUM PO Take by mouth.     MELATONIN PO Take by mouth.     metoprolol succinate (TOPROL-XL) 50 MG 24 hr tablet Take by mouth daily.   0   pravastatin (PRAVACHOL) 40 MG tablet Take 40 mg by mouth daily.     tamsulosin (FLOMAX) 0.4 MG CAPS capsule Take 0.4 mg by mouth.     traZODone (DESYREL) 50 MG tablet TAKE 1-2 TABLETS AT BEDTIME AS NEEDED FOR SLEEP  5   UNABLE TO FIND at bedtime. Med Name: Blood Sugar Support     vitamin C (ASCORBIC ACID) 500 MG tablet Take 1,000 mg by mouth 2 (two) times daily.      VITAMIN D PO Take 2 tablets by mouth 2 (two) times daily.     No current facility-administered medications for this visit.    SUMMARY OF ONCOLOGIC HISTORY:  Thispatient was diagnosed with classical Hodgkin lymphoma, stage IIA last year after presenting initially with palpable lymphadenopathy in his neck. The patient was treated with several cycles of ABVD from April 2013 to August 2013. Subsequently he underwent consolidation radiation treatment. His PET/CT scan from November 2014 showed no evidence of disease recurrence.  PHYSICAL EXAMINATION: ECOG PERFORMANCE STATUS: 0 - Asymptomatic  Vitals:   03/27/23 1049  BP: 133/84  Pulse: 75  Resp: 18  Temp: 98.6 F (37 C)  SpO2: 97%   Filed Weights   03/27/23 1049  Weight: 266 lb 12.8 oz (121 kg)    GENERAL:alert, no distress and comfortable NEURO: alert & oriented x 3 with fluent speech, no focal motor/sensory deficits  LABORATORY DATA:  I have reviewed the data as listed    Component Value Date/Time   NA 141 03/27/2023 1003   NA 141 08/16/2022 1053   NA 139 12/14/2016 1339   K 3.9 03/27/2023 1003   K 4.2 12/14/2016 1339   CL 106 03/27/2023 1003   CL 100 09/16/2012 1514   CO2 28 03/27/2023 1003   CO2 27 12/14/2016 1339   GLUCOSE 168 (H) 03/27/2023 1003   GLUCOSE 103 12/14/2016 1339   GLUCOSE 116 (H) 09/16/2012 1514   BUN 17 03/27/2023 1003   BUN 23 08/16/2022 1053   BUN 12.8 12/14/2016 1339    CREATININE 0.93 03/27/2023 1003   CREATININE 1.04 01/05/2022 0938   CREATININE 1.1 12/14/2016 1339   CALCIUM 9.3 03/27/2023 1003   CALCIUM 9.9 12/14/2016 1339   PROT 6.6 03/27/2023 1003   PROT 6.9 08/16/2022 1053   PROT 7.7 12/14/2016 1339   ALBUMIN 4.3 03/27/2023 1003   ALBUMIN 4.6 08/16/2022 1053   ALBUMIN 4.6 12/14/2016 1339   AST 25 03/27/2023 1003   AST 35 (H) 12/14/2016 1339   ALT 36 03/27/2023 1003   ALT 59 (H) 12/14/2016 1339   ALKPHOS 31 (L) 03/27/2023 1003   ALKPHOS 44 12/14/2016 1339   BILITOT 0.3 03/27/2023 1003   BILITOT 0.43 12/14/2016 1339   GFRNONAA >60 03/27/2023 1003   GFRNONAA 68 08/22/2017 1049   GFRAA >60 12/28/2017 1412   GFRAA  79 08/22/2017 1049    No results found for: "SPEP", "UPEP"  Lab Results  Component Value Date   WBC 6.1 03/27/2023   NEUTROABS 3.1 03/27/2023   HGB 13.7 03/27/2023   HCT 40.4 03/27/2023   MCV 90.6 03/27/2023   PLT 202 03/27/2023      Chemistry      Component Value Date/Time   NA 141 03/27/2023 1003   NA 141 08/16/2022 1053   NA 139 12/14/2016 1339   K 3.9 03/27/2023 1003   K 4.2 12/14/2016 1339   CL 106 03/27/2023 1003   CL 100 09/16/2012 1514   CO2 28 03/27/2023 1003   CO2 27 12/14/2016 1339   BUN 17 03/27/2023 1003   BUN 23 08/16/2022 1053   BUN 12.8 12/14/2016 1339   CREATININE 0.93 03/27/2023 1003   CREATININE 1.04 01/05/2022 0938   CREATININE 1.1 12/14/2016 1339   GLU 286 (H) 11/24/2011 1446      Component Value Date/Time   CALCIUM 9.3 03/27/2023 1003   CALCIUM 9.9 12/14/2016 1339   ALKPHOS 31 (L) 03/27/2023 1003   ALKPHOS 44 12/14/2016 1339   AST 25 03/27/2023 1003   AST 35 (H) 12/14/2016 1339   ALT 36 03/27/2023 1003   ALT 59 (H) 12/14/2016 1339   BILITOT 0.3 03/27/2023 1003   BILITOT 0.43 12/14/2016 1339

## 2023-03-28 DIAGNOSIS — E782 Mixed hyperlipidemia: Secondary | ICD-10-CM | POA: Diagnosis not present

## 2023-03-28 DIAGNOSIS — Z6837 Body mass index (BMI) 37.0-37.9, adult: Secondary | ICD-10-CM | POA: Diagnosis not present

## 2023-03-28 DIAGNOSIS — N481 Balanitis: Secondary | ICD-10-CM | POA: Diagnosis not present

## 2023-03-28 DIAGNOSIS — E1169 Type 2 diabetes mellitus with other specified complication: Secondary | ICD-10-CM | POA: Diagnosis not present

## 2023-03-28 DIAGNOSIS — G620 Drug-induced polyneuropathy: Secondary | ICD-10-CM | POA: Diagnosis not present

## 2023-03-28 DIAGNOSIS — Z21 Asymptomatic human immunodeficiency virus [HIV] infection status: Secondary | ICD-10-CM | POA: Diagnosis not present

## 2023-03-28 DIAGNOSIS — Z0000 Encounter for general adult medical examination without abnormal findings: Secondary | ICD-10-CM | POA: Diagnosis not present

## 2023-03-28 DIAGNOSIS — E781 Pure hyperglyceridemia: Secondary | ICD-10-CM | POA: Diagnosis not present

## 2023-03-28 DIAGNOSIS — F331 Major depressive disorder, recurrent, moderate: Secondary | ICD-10-CM | POA: Diagnosis not present

## 2023-03-28 DIAGNOSIS — Z1339 Encounter for screening examination for other mental health and behavioral disorders: Secondary | ICD-10-CM | POA: Diagnosis not present

## 2023-04-04 ENCOUNTER — Ambulatory Visit: Payer: Medicare HMO | Admitting: Neurology

## 2023-04-04 ENCOUNTER — Encounter: Payer: Self-pay | Admitting: Neurology

## 2023-04-04 VITALS — BP 135/89 | HR 69 | Ht 72.0 in | Wt 268.2 lb

## 2023-04-04 DIAGNOSIS — G43901 Migraine, unspecified, not intractable, with status migrainosus: Secondary | ICD-10-CM | POA: Diagnosis not present

## 2023-04-04 DIAGNOSIS — R519 Headache, unspecified: Secondary | ICD-10-CM | POA: Diagnosis not present

## 2023-04-04 DIAGNOSIS — G43711 Chronic migraine without aura, intractable, with status migrainosus: Secondary | ICD-10-CM

## 2023-04-04 MED ORDER — EMGALITY 120 MG/ML ~~LOC~~ SOAJ
120.0000 mg | SUBCUTANEOUS | 0 refills | Status: DC
Start: 2023-04-04 — End: 2023-04-05

## 2023-04-04 NOTE — Patient Instructions (Addendum)
Galcanezumab Injection What is this medication? GALCANEZUMAB (gal ka NEZ ue mab) prevents migraines. It works by blocking a substance in the body that causes migraines. It may also be used to treat cluster headaches. It is a monoclonal antibody. This medicine may be used for other purposes; ask your health care provider or pharmacist if you have questions. COMMON BRAND NAME(S): Emgality What should I tell my care team before I take this medication? They need to know if you have any of these conditions: An unusual or allergic reaction to galcanezumab, other medications, foods, dyes, or preservatives Pregnant or trying to get pregnant Breast-feeding How should I use this medication? This medication is injected under the skin. You will be taught how to prepare and give it. Take it as directed on the prescription label. Keep taking it unless your care team tells you to stop. It is important that you put your used needles and syringes in a special sharps container. Do not put them in a trash can. If you do not have a sharps container, call your pharmacist or care team to get one. Talk to your care team about the use of this medication in children. Special care may be needed. Overdosage: If you think you have taken too much of this medicine contact a poison control center or emergency room at once. NOTE: This medicine is only for you. Do not share this medicine with others. What if I miss a dose? If you miss a dose, take it as soon as you can. If it is almost time for your next dose, take only that dose. Do not take double or extra doses. What may interact with this medication? Interactions are not expected. This list may not describe all possible interactions. Give your health care provider a list of all the medicines, herbs, non-prescription drugs, or dietary supplements you use. Also tell them if you smoke, drink alcohol, or use illegal drugs. Some items may interact with your medicine. What should  I watch for while using this medication? Visit your care team for regular checks on your progress. Tell your care team if your symptoms do not start to get better or if they get worse. What side effects may I notice from receiving this medication? Side effects that you should report to your care team as soon as possible: Allergic reactions or angioedema--skin rash, itching or hives, swelling of the face, eyes, lips, tongue, arms, or legs, trouble swallowing or breathing Side effects that usually do not require medical attention (report to your care team if they continue or are bothersome): Pain, redness, or irritation at injection site This list may not describe all possible side effects. Call your doctor for medical advice about side effects. You may report side effects to FDA at 1-800-FDA-1088. Where should I keep my medication? Keep out of the reach of children and pets. Store in a refrigerator or at room temperature between 20 and 25 degrees C (68 and 77 degrees F). Refrigeration (preferred): Store in the refrigerator. Do not freeze. Keep in the original container until you are ready to take it. Remove the dose from the carton about 30 minutes before it is time for you to use it. If the dose is not used, it may be stored in original container at room temperature for 7 days. Get rid of any unused medication after the expiration date. Room Temperature: This medication may be stored at room temperature for up to 7 days. Keep it in the original container. Protect from  light until time of use. If it is stored at room temperature, get rid of any unused medication after 7 days or after it expires, whichever is first. To get rid of medications that are no longer needed or have expired: Take the medication to a medication take-back program. Check with your pharmacy or law enforcement to find a location. If you cannot return the medication, ask your pharmacist or care team how to get rid of this medication  safely. NOTE: This sheet is a summary. It may not cover all possible information. If you have questions about this medicine, talk to your doctor, pharmacist, or health care provider.  2024 [employer]/Gold Standard (2021-09-12 00:00:00)
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
## 2023-04-05 ENCOUNTER — Other Ambulatory Visit: Payer: Self-pay | Admitting: *Deleted

## 2023-04-05 DIAGNOSIS — G43711 Chronic migraine without aura, intractable, with status migrainosus: Secondary | ICD-10-CM

## 2023-04-05 MED ORDER — EMGALITY 120 MG/ML ~~LOC~~ SOAJ
120.0000 mg | SUBCUTANEOUS | 4 refills | Status: DC
Start: 2023-04-05 — End: 2023-11-13

## 2023-04-05 MED ORDER — EMGALITY 120 MG/ML ~~LOC~~ SOAJ: 240.0000 mg | Freq: Once | SUBCUTANEOUS | 0 refills | Status: AC

## 2023-04-06 ENCOUNTER — Other Ambulatory Visit: Payer: Self-pay | Admitting: Neurology

## 2023-04-06 DIAGNOSIS — G43711 Chronic migraine without aura, intractable, with status migrainosus: Secondary | ICD-10-CM

## 2023-04-10 DIAGNOSIS — J069 Acute upper respiratory infection, unspecified: Secondary | ICD-10-CM | POA: Diagnosis not present

## 2023-04-14 DIAGNOSIS — S60511A Abrasion of right hand, initial encounter: Secondary | ICD-10-CM | POA: Diagnosis not present

## 2023-04-14 DIAGNOSIS — M25561 Pain in right knee: Secondary | ICD-10-CM | POA: Diagnosis not present

## 2023-04-14 DIAGNOSIS — M25461 Effusion, right knee: Secondary | ICD-10-CM | POA: Diagnosis not present

## 2023-04-20 ENCOUNTER — Telehealth: Payer: Self-pay | Admitting: Neurology

## 2023-04-20 NOTE — Telephone Encounter (Signed)
[facility] (AliyahChecking on status of PA forGalcanezumab-gnlm (EMGALITY) 120 MG/ML SOAJ

## 2023-04-23 ENCOUNTER — Telehealth: Payer: Self-pay

## 2023-04-23 ENCOUNTER — Other Ambulatory Visit (HOSPITAL_COMMUNITY): Payer: Self-pay

## 2023-04-23 NOTE — Telephone Encounter (Signed)
Made pt aware of  Emgality approval

## 2023-04-23 NOTE — Telephone Encounter (Signed)
Pharmacy Patient Advocate Encounter  Received notification from [facility] that Prior Authorization for Emgality 120MG /ML auto-injectors (migraine) has been APPROVED from 04/23/2023 to 07/31/2023. Ran test claim, Copay is $0 for 2ml loading dose.. This test claim was processed through [facility]- copay amounts may vary at other pharmacies due to pharmacy/plan contracts, or as the patient moves through the different stages of their insurance plan.   PA #/Case ID/Reference #: N/A  Key: [id number]

## 2023-04-27 DIAGNOSIS — Z6837 Body mass index (BMI) 37.0-37.9, adult: Secondary | ICD-10-CM | POA: Diagnosis not present

## 2023-04-27 DIAGNOSIS — S8991XD Unspecified injury of right lower leg, subsequent encounter: Secondary | ICD-10-CM | POA: Diagnosis not present

## 2023-05-02 ENCOUNTER — Encounter: Payer: Self-pay | Admitting: Neurology

## 2023-05-03 DIAGNOSIS — Z6837 Body mass index (BMI) 37.0-37.9, adult: Secondary | ICD-10-CM | POA: Diagnosis not present

## 2023-05-03 DIAGNOSIS — M25461 Effusion, right knee: Secondary | ICD-10-CM | POA: Diagnosis not present

## 2023-05-03 DIAGNOSIS — M2391 Unspecified internal derangement of right knee: Secondary | ICD-10-CM | POA: Diagnosis not present

## 2023-05-08 DIAGNOSIS — M2391 Unspecified internal derangement of right knee: Secondary | ICD-10-CM | POA: Diagnosis not present

## 2023-05-08 DIAGNOSIS — S82491A Other fracture of shaft of right fibula, initial encounter for closed fracture: Secondary | ICD-10-CM | POA: Diagnosis not present

## 2023-05-08 DIAGNOSIS — W19XXXA Unspecified fall, initial encounter: Secondary | ICD-10-CM | POA: Diagnosis not present

## 2023-05-08 DIAGNOSIS — S83511A Sprain of anterior cruciate ligament of right knee, initial encounter: Secondary | ICD-10-CM | POA: Diagnosis not present

## 2023-05-08 DIAGNOSIS — S82451A Displaced comminuted fracture of shaft of right fibula, initial encounter for closed fracture: Secondary | ICD-10-CM | POA: Diagnosis not present

## 2023-05-08 DIAGNOSIS — S82144A Nondisplaced bicondylar fracture of right tibia, initial encounter for closed fracture: Secondary | ICD-10-CM | POA: Diagnosis not present

## 2023-05-08 DIAGNOSIS — S83281A Other tear of lateral meniscus, current injury, right knee, initial encounter: Secondary | ICD-10-CM | POA: Diagnosis not present

## 2023-05-22 DIAGNOSIS — M25561 Pain in right knee: Secondary | ICD-10-CM | POA: Diagnosis not present

## 2023-05-31 DIAGNOSIS — M6281 Muscle weakness (generalized): Secondary | ICD-10-CM | POA: Diagnosis not present

## 2023-05-31 DIAGNOSIS — R269 Unspecified abnormalities of gait and mobility: Secondary | ICD-10-CM | POA: Diagnosis not present

## 2023-05-31 DIAGNOSIS — M25561 Pain in right knee: Secondary | ICD-10-CM | POA: Diagnosis not present

## 2023-05-31 DIAGNOSIS — M1711 Unilateral primary osteoarthritis, right knee: Secondary | ICD-10-CM | POA: Diagnosis not present

## 2023-06-05 DIAGNOSIS — R269 Unspecified abnormalities of gait and mobility: Secondary | ICD-10-CM | POA: Diagnosis not present

## 2023-06-05 DIAGNOSIS — M1711 Unilateral primary osteoarthritis, right knee: Secondary | ICD-10-CM | POA: Diagnosis not present

## 2023-06-05 DIAGNOSIS — M6281 Muscle weakness (generalized): Secondary | ICD-10-CM | POA: Diagnosis not present

## 2023-06-05 DIAGNOSIS — M25561 Pain in right knee: Secondary | ICD-10-CM | POA: Diagnosis not present

## 2023-06-07 DIAGNOSIS — R269 Unspecified abnormalities of gait and mobility: Secondary | ICD-10-CM | POA: Diagnosis not present

## 2023-06-07 DIAGNOSIS — M1711 Unilateral primary osteoarthritis, right knee: Secondary | ICD-10-CM | POA: Diagnosis not present

## 2023-06-07 DIAGNOSIS — M6281 Muscle weakness (generalized): Secondary | ICD-10-CM | POA: Diagnosis not present

## 2023-06-07 DIAGNOSIS — M25561 Pain in right knee: Secondary | ICD-10-CM | POA: Diagnosis not present

## 2023-06-12 DIAGNOSIS — M1711 Unilateral primary osteoarthritis, right knee: Secondary | ICD-10-CM | POA: Diagnosis not present

## 2023-06-12 DIAGNOSIS — M6281 Muscle weakness (generalized): Secondary | ICD-10-CM | POA: Diagnosis not present

## 2023-06-12 DIAGNOSIS — R269 Unspecified abnormalities of gait and mobility: Secondary | ICD-10-CM | POA: Diagnosis not present

## 2023-06-12 DIAGNOSIS — M25561 Pain in right knee: Secondary | ICD-10-CM | POA: Diagnosis not present

## 2023-06-14 DIAGNOSIS — R269 Unspecified abnormalities of gait and mobility: Secondary | ICD-10-CM | POA: Diagnosis not present

## 2023-06-14 DIAGNOSIS — M1711 Unilateral primary osteoarthritis, right knee: Secondary | ICD-10-CM | POA: Diagnosis not present

## 2023-06-14 DIAGNOSIS — M25561 Pain in right knee: Secondary | ICD-10-CM | POA: Diagnosis not present

## 2023-06-14 DIAGNOSIS — M6281 Muscle weakness (generalized): Secondary | ICD-10-CM | POA: Diagnosis not present

## 2023-06-19 DIAGNOSIS — R269 Unspecified abnormalities of gait and mobility: Secondary | ICD-10-CM | POA: Diagnosis not present

## 2023-06-19 DIAGNOSIS — M25561 Pain in right knee: Secondary | ICD-10-CM | POA: Diagnosis not present

## 2023-06-19 DIAGNOSIS — M6281 Muscle weakness (generalized): Secondary | ICD-10-CM | POA: Diagnosis not present

## 2023-06-19 DIAGNOSIS — M1711 Unilateral primary osteoarthritis, right knee: Secondary | ICD-10-CM | POA: Diagnosis not present

## 2023-06-21 DIAGNOSIS — M6281 Muscle weakness (generalized): Secondary | ICD-10-CM | POA: Diagnosis not present

## 2023-06-21 DIAGNOSIS — R269 Unspecified abnormalities of gait and mobility: Secondary | ICD-10-CM | POA: Diagnosis not present

## 2023-06-21 DIAGNOSIS — M1711 Unilateral primary osteoarthritis, right knee: Secondary | ICD-10-CM | POA: Diagnosis not present

## 2023-06-21 DIAGNOSIS — M25561 Pain in right knee: Secondary | ICD-10-CM | POA: Diagnosis not present

## 2023-06-25 DIAGNOSIS — M6281 Muscle weakness (generalized): Secondary | ICD-10-CM | POA: Diagnosis not present

## 2023-06-25 DIAGNOSIS — M25561 Pain in right knee: Secondary | ICD-10-CM | POA: Diagnosis not present

## 2023-06-25 DIAGNOSIS — M1711 Unilateral primary osteoarthritis, right knee: Secondary | ICD-10-CM | POA: Diagnosis not present

## 2023-06-25 DIAGNOSIS — R269 Unspecified abnormalities of gait and mobility: Secondary | ICD-10-CM | POA: Diagnosis not present

## 2023-06-27 DIAGNOSIS — M6281 Muscle weakness (generalized): Secondary | ICD-10-CM | POA: Diagnosis not present

## 2023-06-27 DIAGNOSIS — M25561 Pain in right knee: Secondary | ICD-10-CM | POA: Diagnosis not present

## 2023-06-27 DIAGNOSIS — R269 Unspecified abnormalities of gait and mobility: Secondary | ICD-10-CM | POA: Diagnosis not present

## 2023-06-27 DIAGNOSIS — M1711 Unilateral primary osteoarthritis, right knee: Secondary | ICD-10-CM | POA: Diagnosis not present

## 2023-07-03 DIAGNOSIS — S82141D Displaced bicondylar fracture of right tibia, subsequent encounter for closed fracture with routine healing: Secondary | ICD-10-CM | POA: Diagnosis not present

## 2023-07-03 DIAGNOSIS — M6281 Muscle weakness (generalized): Secondary | ICD-10-CM | POA: Diagnosis not present

## 2023-07-03 DIAGNOSIS — M25561 Pain in right knee: Secondary | ICD-10-CM | POA: Diagnosis not present

## 2023-07-03 DIAGNOSIS — S82831D Other fracture of upper and lower end of right fibula, subsequent encounter for closed fracture with routine healing: Secondary | ICD-10-CM | POA: Diagnosis not present

## 2023-07-03 DIAGNOSIS — M1711 Unilateral primary osteoarthritis, right knee: Secondary | ICD-10-CM | POA: Diagnosis not present

## 2023-07-03 DIAGNOSIS — R269 Unspecified abnormalities of gait and mobility: Secondary | ICD-10-CM | POA: Diagnosis not present

## 2023-07-05 DIAGNOSIS — M1711 Unilateral primary osteoarthritis, right knee: Secondary | ICD-10-CM | POA: Diagnosis not present

## 2023-07-05 DIAGNOSIS — M6281 Muscle weakness (generalized): Secondary | ICD-10-CM | POA: Diagnosis not present

## 2023-07-05 DIAGNOSIS — R269 Unspecified abnormalities of gait and mobility: Secondary | ICD-10-CM | POA: Diagnosis not present

## 2023-07-05 DIAGNOSIS — M25561 Pain in right knee: Secondary | ICD-10-CM | POA: Diagnosis not present

## 2023-07-10 DIAGNOSIS — M25561 Pain in right knee: Secondary | ICD-10-CM | POA: Diagnosis not present

## 2023-07-10 DIAGNOSIS — R269 Unspecified abnormalities of gait and mobility: Secondary | ICD-10-CM | POA: Diagnosis not present

## 2023-07-10 DIAGNOSIS — M6281 Muscle weakness (generalized): Secondary | ICD-10-CM | POA: Diagnosis not present

## 2023-07-10 DIAGNOSIS — M1711 Unilateral primary osteoarthritis, right knee: Secondary | ICD-10-CM | POA: Diagnosis not present

## 2023-07-16 ENCOUNTER — Telehealth: Payer: Self-pay | Admitting: Adult Health

## 2023-07-16 ENCOUNTER — Telehealth: Payer: Self-pay | Admitting: Neurology

## 2023-07-16 NOTE — Telephone Encounter (Signed)
Pt has called to r/s his my chart vv

## 2023-07-16 NOTE — Telephone Encounter (Signed)
LVM and sent mychart msg informing pt of need to reschedule 08/03/23 appt - NP out

## 2023-08-02 ENCOUNTER — Encounter: Payer: Self-pay | Admitting: Infectious Diseases

## 2023-08-03 ENCOUNTER — Telehealth: Payer: Medicare HMO | Admitting: Adult Health

## 2023-08-03 DIAGNOSIS — E1165 Type 2 diabetes mellitus with hyperglycemia: Secondary | ICD-10-CM | POA: Diagnosis not present

## 2023-08-03 DIAGNOSIS — Z6838 Body mass index (BMI) 38.0-38.9, adult: Secondary | ICD-10-CM | POA: Diagnosis not present

## 2023-08-06 ENCOUNTER — Telehealth: Payer: Self-pay | Admitting: *Deleted

## 2023-08-06 ENCOUNTER — Other Ambulatory Visit (HOSPITAL_COMMUNITY): Payer: Self-pay

## 2023-08-06 NOTE — Telephone Encounter (Signed)
 Call from pt who stated when he called [facility] to refill his meds, he was told the ASPAT ?, med assistance program, had expired and needs to be re-started. I asked it he meant ADAP; stated the person at the pharmacy did not say ADAP. I told him I will ask Dr Eben. He also stated his BS's have been running higher; put on Invokamet and Ozempic was increased by his PCP.

## 2023-08-08 ENCOUNTER — Other Ambulatory Visit (HOSPITAL_COMMUNITY)
Admission: RE | Admit: 2023-08-08 | Discharge: 2023-08-08 | Disposition: A | Discharge status: Home / Self Care | Payer: Medicare HMO | Source: Ambulatory Visit | Attending: Internal Medicine | Admitting: Internal Medicine

## 2023-08-08 ENCOUNTER — Other Ambulatory Visit (INDEPENDENT_AMBULATORY_CARE_PROVIDER_SITE_OTHER): Payer: Medicare HMO

## 2023-08-08 DIAGNOSIS — Z113 Encounter for screening for infections with a predominantly sexual mode of transmission: Secondary | ICD-10-CM

## 2023-08-08 DIAGNOSIS — B20 Human immunodeficiency virus [HIV] disease: Secondary | ICD-10-CM | POA: Diagnosis not present

## 2023-08-08 DIAGNOSIS — E781 Pure hyperglyceridemia: Secondary | ICD-10-CM

## 2023-08-08 LAB — T-HELPER CELL (CD4) - (RCID CLINIC ONLY)
CD4 % Helper T Cell: 31 % — ABNORMAL LOW (ref 33–65)
CD4 T Cell Abs: 684 /uL (ref 400–1790)

## 2023-08-09 LAB — COMPREHENSIVE METABOLIC PANEL
ALT: 48 [IU]/L — ABNORMAL HIGH (ref 0–44)
AST: 34 [IU]/L (ref 0–40)
Albumin: 4.5 g/dL (ref 3.8–4.9)
Alkaline Phosphatase: 42 [IU]/L — ABNORMAL LOW (ref 44–121)
BUN/Creatinine Ratio: 17 (ref 9–20)
BUN: 17 mg/dL (ref 6–24)
Bilirubin Total: 0.3 mg/dL (ref 0.0–1.2)
CO2: 24 mmol/L (ref 20–29)
Calcium: 8.3 mg/dL — ABNORMAL LOW (ref 8.7–10.2)
Chloride: 100 mmol/L (ref 96–106)
Creatinine, Ser: 1.01 mg/dL (ref 0.76–1.27)
Globulin, Total: 2.2 g/dL (ref 1.5–4.5)
Glucose: 214 mg/dL — ABNORMAL HIGH (ref 70–99)
Potassium: 4.6 mmol/L (ref 3.5–5.2)
Sodium: 141 mmol/L (ref 134–144)
Total Protein: 6.7 g/dL (ref 6.0–8.5)
eGFR: 86 mL/min/{1.73_m2} (ref >59)

## 2023-08-09 LAB — URINE CYTOLOGY ANCILLARY ONLY
Chlamydia: NEGATIVE
Comment: NEGATIVE
Comment: NORMAL
Neisseria Gonorrhea: NEGATIVE

## 2023-08-09 LAB — CBC
Hematocrit: 42 % (ref 37.5–51.0)
Hemoglobin: 13.7 g/dL (ref 13.0–17.7)
MCH: 30.8 pg (ref 26.6–33.0)
MCHC: 32.6 g/dL (ref 31.5–35.7)
MCV: 94 fL (ref 79–97)
Platelets: 225 10*3/uL (ref 150–450)
RBC: 4.45 x10E6/uL (ref 4.14–5.80)
RDW: 12.6 % (ref 11.6–15.4)
WBC: 6 10*3/uL (ref 3.4–10.8)

## 2023-08-09 LAB — LIPID PANEL
Chol/HDL Ratio: 8.4 {ratio} — ABNORMAL HIGH (ref 0.0–5.0)
Cholesterol, Total: 194 mg/dL (ref 100–199)
HDL: 23 mg/dL — ABNORMAL LOW (ref >39)
LDL Chol Calc (NIH): 80 mg/dL (ref 0–99)
Triglycerides: 563 mg/dL (ref 0–149)
VLDL Cholesterol Cal: 91 mg/dL — ABNORMAL HIGH (ref 5–40)

## 2023-08-09 LAB — RPR: RPR Ser Ql: NONREACTIVE

## 2023-08-09 LAB — HIV-1 RNA QUANT-NO REFLEX-BLD
HIV-1 RNA Viral Load Log: 1.903 {Log_copies}/mL
HIV-1 RNA Viral Load: 80 {copies}/mL

## 2023-08-29 ENCOUNTER — Ambulatory Visit: Payer: Medicare HMO | Admitting: Infectious Diseases

## 2023-08-29 ENCOUNTER — Other Ambulatory Visit: Payer: Self-pay

## 2023-08-29 ENCOUNTER — Encounter: Payer: Self-pay | Admitting: Infectious Diseases

## 2023-08-29 VITALS — BP 116/65 | HR 100 | Temp 98.4°F | Ht 72.0 in | Wt 272.7 lb

## 2023-08-29 DIAGNOSIS — L03115 Cellulitis of right lower limb: Secondary | ICD-10-CM

## 2023-08-29 DIAGNOSIS — B20 Human immunodeficiency virus [HIV] disease: Secondary | ICD-10-CM | POA: Diagnosis not present

## 2023-08-29 DIAGNOSIS — I10 Essential (primary) hypertension: Secondary | ICD-10-CM

## 2023-08-29 DIAGNOSIS — E11621 Type 2 diabetes mellitus with foot ulcer: Secondary | ICD-10-CM

## 2023-08-29 DIAGNOSIS — G43711 Chronic migraine without aura, intractable, with status migrainosus: Secondary | ICD-10-CM | POA: Diagnosis not present

## 2023-08-29 DIAGNOSIS — E1140 Type 2 diabetes mellitus with diabetic neuropathy, unspecified: Secondary | ICD-10-CM

## 2023-08-29 DIAGNOSIS — L97519 Non-pressure chronic ulcer of other part of right foot with unspecified severity: Secondary | ICD-10-CM

## 2023-08-29 DIAGNOSIS — L02415 Cutaneous abscess of right lower limb: Secondary | ICD-10-CM | POA: Diagnosis not present

## 2023-08-29 DIAGNOSIS — Z113 Encounter for screening for infections with a predominantly sexual mode of transmission: Secondary | ICD-10-CM

## 2023-08-29 DIAGNOSIS — E66812 Obesity, class 2: Secondary | ICD-10-CM | POA: Diagnosis not present

## 2023-08-29 DIAGNOSIS — Z794 Long term (current) use of insulin: Secondary | ICD-10-CM

## 2023-08-29 NOTE — Assessment & Plan Note (Signed)
He is doing well Blip, explained to pt.  Will continue triumeq.  Will see him back in 6 months.  Vax are up to date.  Does not need condoms.

## 2023-08-29 NOTE — Assessment & Plan Note (Signed)
Ng managed by his PCP, ortho.

## 2023-08-29 NOTE — Assessment & Plan Note (Signed)
Appreciate his PCP f/u Will check A1C at his f/u

## 2023-08-29 NOTE — Assessment & Plan Note (Signed)
Encouraged him to go back to podiatry

## 2023-08-29 NOTE — Assessment & Plan Note (Signed)
appreciate neuro f/u
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
## 2023-08-29 NOTE — Assessment & Plan Note (Signed)
Ozempeic.  Wt up slightly.

## 2023-08-29 NOTE — Assessment & Plan Note (Signed)
-  well controlled today

## 2023-08-31 ENCOUNTER — Telehealth: Payer: Medicare HMO | Admitting: Adult Health

## 2023-08-31 ENCOUNTER — Telehealth (INDEPENDENT_AMBULATORY_CARE_PROVIDER_SITE_OTHER): Payer: Medicare HMO | Admitting: Adult Health

## 2023-08-31 DIAGNOSIS — G43711 Chronic migraine without aura, intractable, with status migrainosus: Secondary | ICD-10-CM | POA: Diagnosis not present

## 2023-08-31 NOTE — Progress Notes (Signed)
PATIENT: Jon Shannon DOB: 01-07-1966  REASON FOR VISIT: follow up HISTORY FROM: patient  Virtual Visit via Video Note  I connected with Jon Shannon on 08/31/23 at 11:30 AM EST by a video enabled telemedicine application located remotely at [facility] and verified that I am speaking with the correct person using two identifiers who was located at their own home.   I discussed the limitations of evaluation and management by telemedicine and the availability of in person appointments. The patient expressed understanding and agreed to proceed.   PATIENT: Jon Shannon DOB: 1966/03/23  REASON FOR VISIT: follow up HISTORY FROM: patient  HISTORY OF PRESENT ILLNESS: Today 08/31/23   Jon Shannon is a 58 y.o. male who has been followed in this office for Migraines. Returns today for follow-up.  He reports that he has been taking Emgality.  His last injection was January 14.  He states that he has not had any migraines since his last injection.  Reports he does get stress headaches.  Also reports that he has cellulitis has been running temperatures.  This is also caused him to have a mild headache. He states that he is on antibiotics.  He joins me today for virtual visit    HISTORY Patient is here for headaches. Jon Shannon and Jon Shannon   9//2024: Still having headaches, went away for about a month and then came back about a month after the infusion. 3/10. He has a lot of problems with sleep. He doesn't sleep until 2-3am. Lots of stress. Doesn't know if he snores denies wanting to nap (he is large, obes, large neck, mallampati 3. Dealing with a sinus infection for a week and woke up with a dry mouth. He was checked for sleep apnea several years ago.Headache came back in June. Right now on the top of the head and behind the left eye. Slow pulse, throbbing, daily, more when upset and stressed out and depressed. 3-4 ibuprofen once a day for his leg and foot  pain maybe twice a day. Doesn't wake up with them. Start mid morning or a few hours after he wakes up. Dark room hel[ps. Nausea. Stress. Light sensitivity. Has tinnitus in his ears and it really bothers him. He has daily migraines for > 3 months can last all day. Sleep in a dark room helps. Refuses sleep referral for possible OSA . They get to moderate to severe every day. We gave him a migraine cocktail, pain went to 0 started him on emgality. No other focal neurologic deficits, associated symptoms, inciting events or modifiable factors.     Follow up 12/07/2022:  58 y.o. male here as requested by Lise Auer, MD for daily headaches, past medical history of syphilis in 2021 diagnosed for chlamydia history of trichomonas GC in 1990 admits to male and male partners, type 2 diabetes with hypertriglyceridemia, hyperlipidemia mixed, depression, ED, peripheral neuropathy due to chemotherapy, HIV positive Dr. Ninetta Lights, personal history of Hodgkin's lymphoma step 2 localized to the bilateral neck sees Dr. Emeline Darling such, disabled due to neuropathy depression and HIV   This is a patient without a history of migraines, with a severe headache for 7 to 9 weeks.  I do not believe this is a primary headache that this is secondary. MRI was negative in [place]. I have been working Dr. Ninetta Lights, much appreciate my colleague, in infectious disease, and we performed an LP which has been unremarkable except for an increase in lymphocytes. All other testing (extensive)  coming back negative. I believe this may be something viral, aseptic meningitis, we are still waiting on all the tests. I sent him to ophthalmology urgently as well.    Headaches are slightly improving but still contonuous, he didn't sleep last night, he is extremely upset. We went through all the labs, appears to be a viral meningitis. I spoke to Dr. Ninetta Lights who has been wonderful and we brought him to get an infusio of depakote, solumedrol, toradol and zofran which  helped exceptionally down to a 0/10. I warned patient the solumedrol mayincrease his glucose, needs to watch that but he was very happy when he left. If the headache returns I will perform nerve blocks told him to call us.   Across the forehead. Throbbing. Pulsating. No Light and sound sensitivity. Some are long, continuous, some are sharp, stabbing pains. They can vary in location. The last headache started last Wednesday night at 10 pm and ended last night.      Meds tried > 3 months: Nurtec samples (not helpful and made him nauseous), Tylenol, Ibuprofen, Lexapro, Metoprolol XL, Lisinopril., flexeril, decadron, lexapro, magnesium, zofran, compazine, trazodone, topiramate, amitriptyline, rizatriptan, sumatriptan, Aimovig contraindicated due to constipation      REVIEW OF SYSTEMS: Out of a complete 14 system review of symptoms, the patient complains only of the following symptoms, and all other reviewed systems are negative.  ALLERGIES: Allergies  Allergen Reactions   Shrimp [Shellfish Allergy] Anaphylaxis    Lobster, too   Cefdinir Other (See Comments)    Oral ulcer, mild.     HOME MEDICATIONS: Outpatient Medications Prior to Visit  Medication Sig Dispense Refill   abacavir-dolutegravir-lamiVUDine (TRIUMEQ) 600-50-300 MG tablet TAKE 1 TABLET BY MOUTH DAILY WITH BREAKFAST 90 tablet 3   aspirin 81 MG tablet Take 81 mg by mouth daily.     B Complex-C (B-COMPLEX WITH VITAMIN C) tablet Take 2 tablets by mouth daily.      bisoprolol-hydrochlorothiazide (ZIAC) 5-6.25 MG tablet Take 1 tablet by mouth daily.     Chromium Picolinate (CHROMIUM PICOLATE PO) Take 2 tablets by mouth 2 (two) times daily.      CINNAMON PO Take 2 capsules by mouth 2 (two) times daily.      Dulaglutide (TRULICITY) 1.5 MG/0.5ML SOPN Inject 3 mg into the skin once a week.     escitalopram (LEXAPRO) 20 MG tablet Take 20 mg by mouth daily.   0   fenofibrate 160 MG tablet Take 160 mg by mouth at bedtime.     fish  oil-omega-3 fatty acids 1000 MG capsule Take 2 g by mouth 2 (two) times daily. Two capsules BID     Galcanezumab-gnlm (EMGALITY) 120 MG/ML SOAJ Inject 120 mg into the skin every 30 (thirty) days. 1 mL 4   GARLIC OIL PO Take 2 tablets by mouth daily.      INVOKAMET [phone] MG TABS Take 1 tablet by mouth daily.     lisinopril (PRINIVIL,ZESTRIL) 5 MG tablet Take 5 mg by mouth daily.     loratadine (CLARITIN) 10 MG tablet Take 20 mg by mouth daily.     MELATONIN PO Take by mouth.     metoprolol succinate (TOPROL-XL) 50 MG 24 hr tablet Take by mouth daily.   0   pravastatin (PRAVACHOL) 40 MG tablet Take 40 mg by mouth daily.     tamsulosin (FLOMAX) 0.4 MG CAPS capsule Take 0.4 mg by mouth.     traZODone (DESYREL) 50 MG tablet TAKE 1-2 TABLETS AT BEDTIME  AS NEEDED FOR SLEEP  5   UNABLE TO FIND at bedtime. Med Name: Blood Sugar Support     vitamin C (ASCORBIC ACID) 500 MG tablet Take 1,000 mg by mouth 2 (two) times daily.      VITAMIN D PO Take 2 tablets by mouth 2 (two) times daily.     No facility-administered medications prior to visit.    PAST MEDICAL HISTORY: Past Medical History:  Diagnosis Date   Anxiety    Depression    Diabetes (HCC)    type II from chemotherapy   DJD (degenerative joint disease)    Elevated serum creatinine 06/15/2014   Fracture    every metatarsal in R foot, and 2nd metatarsal in L foot while walking   Hepatitis A    resolved.    HIV INFECTION 11/30/2009   no history of opportunistic infection   Hodgkin's lymphoma (HCC) 11/09/2011   Hx of radiation therapy 04/03/12 -04/29/12   Hodgkin's disease -neck   Hyperlipidemia 08/07/2011   HYPERTENSION 11/30/2009   Hypertriglyceridemia    Memory difficulty    "chemo brain" long and short term memory come and go   Neuropathy    from chemo and diabetes   PTSD (post-traumatic stress disorder)    Rectal bleeding 09/04/2013   Weight loss 05/29/2013    PAST SURGICAL HISTORY: Past Surgical History:  Procedure  Laterality Date   cancerous lymph node removal     left cervical node excisional biopsy  10/30/2011   left index finger trauma repair     [place]     TONSILLECTOMY     as teenager    FAMILY HISTORY: Family History  Problem Relation Age of Onset   Cancer Mother 54       breast cancer    Seizures Mother    Hypertension Mother    Hyperlipidemia Mother    Diabetes Father    Hypertension Father    Hyperlipidemia Father    Cancer Father        brain tumor (unknown type)   Cancer Maternal Grandmother        CLL   Cancer Maternal Aunt        lung cancer   Migraines Neg Hx     SOCIAL HISTORY: Social History   Socioeconomic History   Marital status: Single    Spouse name: Not on file   Number of children: 0   Years of education: 18   Highest education level: Bachelor's degree (e.g., BA, AB, BS)  Occupational History   Occupation: Job Acupuncturist: [employer]  Tobacco Use   Smoking status: Former    Current packs/day: 0.00    Types: Cigarettes    Quit date: 07/01/2011    Years since quitting: 12.1   Smokeless tobacco: Former    Quit date: 2012  Vaping Use   Vaping status: Never Used  Substance and Sexual Activity   Alcohol use: Not Currently   Drug use: No   Sexual activity: Not Currently    Comment: pt. declined condoms  Other Topics Concern   Not on file  Social History Narrative   Lives with his parents, pays rent   Right handed   Caffeine: 1 glass tea or 1 cup coffee in the morning   Social Drivers of Health   Financial Resource Strain: Not on file  Food Insecurity: Not on file  Transportation Needs: Not on file  Physical Activity: Not on file  Stress: Not on file  Social Connections: Not on file  Intimate Partner Violence: Not on file      PHYSICAL EXAM Generalized: Well developed, in no acute distress   Neurological examination  Mentation: Alert oriented to time, place, history taking. Follows all commands speech and language  fluent Cranial nerve II-XII:Extraocular movements were full. Facial symmetry noted.   DIAGNOSTIC DATA (LABS, IMAGING, TESTING) - I reviewed patient records, labs, notes, testing and imaging myself where available.  Lab Results  Component Value Date   WBC 6.0 08/08/2023   HGB 13.7 08/08/2023   HCT 42.0 08/08/2023   MCV 94 08/08/2023   PLT 225 08/08/2023      Component Value Date/Time   NA 141 08/08/2023 0916   NA 139 12/14/2016 1339   K 4.6 08/08/2023 0916   K 4.2 12/14/2016 1339   CL 100 08/08/2023 0916   CL 100 09/16/2012 1514   CO2 24 08/08/2023 0916   CO2 27 12/14/2016 1339   GLUCOSE 214 (H) 08/08/2023 0916   GLUCOSE 168 (H) 03/27/2023 1003   GLUCOSE 103 12/14/2016 1339   GLUCOSE 116 (H) 09/16/2012 1514   BUN 17 08/08/2023 0916   BUN 12.8 12/14/2016 1339   CREATININE 1.01 08/08/2023 0916   CREATININE 0.93 03/27/2023 1003   CREATININE 1.04 01/05/2022 0938   CREATININE 1.1 12/14/2016 1339   CALCIUM 8.3 (L) 08/08/2023 0916   CALCIUM 9.9 12/14/2016 1339   PROT 6.7 08/08/2023 0916   PROT 7.7 12/14/2016 1339   ALBUMIN 4.5 08/08/2023 0916   ALBUMIN 4.6 12/14/2016 1339   AST 34 08/08/2023 0916   AST 25 03/27/2023 1003   AST 35 (H) 12/14/2016 1339   ALT 48 (H) 08/08/2023 0916   ALT 36 03/27/2023 1003   ALT 59 (H) 12/14/2016 1339   ALKPHOS 42 (L) 08/08/2023 0916   ALKPHOS 44 12/14/2016 1339   BILITOT 0.3 08/08/2023 0916   BILITOT 0.3 03/27/2023 1003   BILITOT 0.43 12/14/2016 1339   GFRNONAA >60 03/27/2023 1003   GFRNONAA 68 08/22/2017 1049   GFRAA >60 12/28/2017 1412   GFRAA 79 08/22/2017 1049   Lab Results  Component Value Date   CHOL 194 08/08/2023   HDL 23 (L) 08/08/2023   LDLCALC 80 08/08/2023   TRIG 563 (HH) 08/08/2023   CHOLHDL 8.4 (H) 08/08/2023   Lab Results  Component Value Date   HGBA1C 5.3 07/19/2021   No results found for: "VITAMINB12" Lab Results  Component Value Date   TSH 3.79 12/25/2019      ASSESSMENT AND PLAN 58 y.o. year old  male  has a past medical history of Anxiety, Depression, Diabetes (HCC), DJD (degenerative joint disease), Elevated serum creatinine (06/15/2014), Fracture, Hepatitis A, HIV INFECTION (11/30/2009), Hodgkin's lymphoma (HCC) (11/09/2011), radiation therapy (04/03/12 -04/29/12), Hyperlipidemia (08/07/2011), HYPERTENSION (11/30/2009), Hypertriglyceridemia, Memory difficulty, Neuropathy, PTSD (post-traumatic stress disorder), Rectal bleeding (09/04/2013), and Weight loss (05/29/2013). here with:  1.  Migraine headaches  -Continue Emgality 120 mg monthly injections -Advised if headaches worsen or he develops new symptoms he should let us know. -Follow-up in 6 months or sooner if needed   Butch Penny, MSN, NP-C 08/31/2023, 11:42 AM [facility] [address] [place], [place] [address] [phone]

## 2023-09-06 ENCOUNTER — Telehealth: Payer: Self-pay | Admitting: Pharmacy Technician

## 2023-09-06 ENCOUNTER — Other Ambulatory Visit (HOSPITAL_COMMUNITY): Payer: Self-pay

## 2023-09-06 DIAGNOSIS — M1711 Unilateral primary osteoarthritis, right knee: Secondary | ICD-10-CM | POA: Diagnosis not present

## 2023-09-06 NOTE — Telephone Encounter (Signed)
 Pharmacy Patient Advocate Encounter   Received notification from CoverMyMeds that prior authorization for Emgality  120MG /ML auto-injectors (migraine) is required/requested.   Insurance verification completed.   The patient is insured through [employer] .   Per test claim: The current 28 day co-pay is, $0.00.  No PA needed at this time. This test claim was processed through [facility]- copay amounts may vary at other pharmacies due to pharmacy/plan contracts, or as the patient moves through the different stages of their insurance plan.

## 2023-09-09 ENCOUNTER — Other Ambulatory Visit: Payer: Self-pay | Admitting: Infectious Diseases

## 2023-09-09 DIAGNOSIS — C8190 Hodgkin lymphoma, unspecified, unspecified site: Secondary | ICD-10-CM

## 2023-10-17 ENCOUNTER — Other Ambulatory Visit (HOSPITAL_COMMUNITY): Payer: Self-pay

## 2023-10-17 ENCOUNTER — Telehealth: Payer: Self-pay

## 2023-10-17 NOTE — Telephone Encounter (Signed)
 Pharmacy Patient Advocate Encounter   Received notification from Phone that prior authorization for Emgality 120mg /ml auto-injector is required/requested.   Insurance verification completed.   The patient is insured through [facility] ADVANTAGE/RX ADVANCE .   Per test claim: PA required; PA submitted to above mentioned insurance via Phone Key/confirmation #/EOC [id number] Status is pending  Received a Teams message asking to take call form [employer]-Took call and answered clinical question-spoke with Ritika. PA was submitted via phone and turn around time is 72hours. Awaiting determination.

## 2023-10-18 ENCOUNTER — Other Ambulatory Visit (HOSPITAL_COMMUNITY): Payer: Self-pay

## 2023-10-18 NOTE — Telephone Encounter (Signed)
 Pharmacy Patient Advocate Encounter  Received notification from [facility] ADVANTAGE/RX ADVANCE that Prior Authorization for Emgality has been APPROVED from 10/17/2023 to 04/18/2024   PA #/Case ID/Reference #: [id number]

## 2023-11-08 ENCOUNTER — Other Ambulatory Visit: Payer: Self-pay | Admitting: Neurology

## 2023-11-08 DIAGNOSIS — G43711 Chronic migraine without aura, intractable, with status migrainosus: Secondary | ICD-10-CM

## 2023-11-13 ENCOUNTER — Other Ambulatory Visit: Payer: Self-pay | Admitting: *Deleted

## 2023-11-13 DIAGNOSIS — G43711 Chronic migraine without aura, intractable, with status migrainosus: Secondary | ICD-10-CM

## 2023-11-13 MED ORDER — EMGALITY 120 MG/ML ~~LOC~~ SOAJ: 120.0000 mg | SUBCUTANEOUS | 11 refills | Status: AC

## 2023-11-15 DIAGNOSIS — G8929 Other chronic pain: Secondary | ICD-10-CM | POA: Diagnosis not present

## 2023-11-15 DIAGNOSIS — I10 Essential (primary) hypertension: Secondary | ICD-10-CM | POA: Diagnosis not present

## 2023-11-15 DIAGNOSIS — F330 Major depressive disorder, recurrent, mild: Secondary | ICD-10-CM | POA: Diagnosis not present

## 2023-11-15 DIAGNOSIS — G43909 Migraine, unspecified, not intractable, without status migrainosus: Secondary | ICD-10-CM | POA: Diagnosis not present

## 2023-11-15 DIAGNOSIS — J309 Allergic rhinitis, unspecified: Secondary | ICD-10-CM | POA: Diagnosis not present

## 2023-11-15 DIAGNOSIS — F4312 Post-traumatic stress disorder, chronic: Secondary | ICD-10-CM | POA: Diagnosis not present

## 2023-11-15 DIAGNOSIS — E1169 Type 2 diabetes mellitus with other specified complication: Secondary | ICD-10-CM | POA: Diagnosis not present

## 2023-11-15 DIAGNOSIS — G4700 Insomnia, unspecified: Secondary | ICD-10-CM | POA: Diagnosis not present

## 2023-11-15 DIAGNOSIS — F411 Generalized anxiety disorder: Secondary | ICD-10-CM | POA: Diagnosis not present

## 2023-11-15 DIAGNOSIS — B20 Human immunodeficiency virus [HIV] disease: Secondary | ICD-10-CM | POA: Diagnosis not present

## 2023-11-15 DIAGNOSIS — E785 Hyperlipidemia, unspecified: Secondary | ICD-10-CM | POA: Diagnosis not present

## 2023-11-16 ENCOUNTER — Encounter: Payer: Self-pay | Admitting: Neurology

## 2023-11-19 ENCOUNTER — Telehealth: Payer: Self-pay | Admitting: Infectious Diseases

## 2023-11-19 NOTE — Telephone Encounter (Signed)
 Copied from CRM [id number]. Topic: Appointments - Appointment Scheduling >> Nov 16, 2023  9:59 AM Jyl Or H wrote: Patient called to schedule six month follow up in July with Dr. Alwin Baars. No template available for July for any IMP provider. Please assist and follow up with patient.

## 2023-11-21 ENCOUNTER — Other Ambulatory Visit (INDEPENDENT_AMBULATORY_CARE_PROVIDER_SITE_OTHER): Payer: Self-pay

## 2023-11-21 DIAGNOSIS — Z0289 Encounter for other administrative examinations: Secondary | ICD-10-CM

## 2023-12-10 ENCOUNTER — Other Ambulatory Visit: Payer: Self-pay | Admitting: Neurology

## 2023-12-10 DIAGNOSIS — G43711 Chronic migraine without aura, intractable, with status migrainosus: Secondary | ICD-10-CM

## 2023-12-12 DIAGNOSIS — H11001 Unspecified pterygium of right eye: Secondary | ICD-10-CM | POA: Diagnosis not present

## 2023-12-12 DIAGNOSIS — E119 Type 2 diabetes mellitus without complications: Secondary | ICD-10-CM | POA: Diagnosis not present

## 2023-12-12 DIAGNOSIS — H04123 Dry eye syndrome of bilateral lacrimal glands: Secondary | ICD-10-CM | POA: Diagnosis not present

## 2023-12-13 DIAGNOSIS — E782 Mixed hyperlipidemia: Secondary | ICD-10-CM | POA: Diagnosis not present

## 2023-12-13 DIAGNOSIS — Z8571 Personal history of Hodgkin lymphoma: Secondary | ICD-10-CM | POA: Diagnosis not present

## 2023-12-13 DIAGNOSIS — Z6833 Body mass index (BMI) 33.0-33.9, adult: Secondary | ICD-10-CM | POA: Diagnosis not present

## 2023-12-13 DIAGNOSIS — E1169 Type 2 diabetes mellitus with other specified complication: Secondary | ICD-10-CM | POA: Diagnosis not present

## 2023-12-14 DIAGNOSIS — M1711 Unilateral primary osteoarthritis, right knee: Secondary | ICD-10-CM | POA: Diagnosis not present

## 2024-01-16 DIAGNOSIS — L03115 Cellulitis of right lower limb: Secondary | ICD-10-CM | POA: Diagnosis not present

## 2024-01-30 ENCOUNTER — Encounter: Payer: Self-pay | Admitting: Infectious Diseases

## 2024-02-06 ENCOUNTER — Other Ambulatory Visit

## 2024-02-06 ENCOUNTER — Other Ambulatory Visit (HOSPITAL_COMMUNITY)
Admission: RE | Admit: 2024-02-06 | Discharge: 2024-02-06 | Disposition: A | Discharge status: Home / Self Care | Source: Ambulatory Visit | Attending: Internal Medicine | Admitting: Internal Medicine

## 2024-02-06 DIAGNOSIS — E1140 Type 2 diabetes mellitus with diabetic neuropathy, unspecified: Secondary | ICD-10-CM | POA: Diagnosis not present

## 2024-02-06 DIAGNOSIS — E66812 Obesity, class 2: Secondary | ICD-10-CM

## 2024-02-06 DIAGNOSIS — Z113 Encounter for screening for infections with a predominantly sexual mode of transmission: Secondary | ICD-10-CM | POA: Diagnosis not present

## 2024-02-06 DIAGNOSIS — B20 Human immunodeficiency virus [HIV] disease: Secondary | ICD-10-CM

## 2024-02-06 DIAGNOSIS — Z794 Long term (current) use of insulin: Secondary | ICD-10-CM | POA: Diagnosis not present

## 2024-02-07 LAB — HEMOGLOBIN A1C
Est. average glucose Bld gHb Est-mCnc: 117 mg/dL
Hgb A1c MFr Bld: 5.7 % — ABNORMAL HIGH (ref 4.8–5.6)

## 2024-02-07 LAB — URINE CYTOLOGY ANCILLARY ONLY
Chlamydia: NEGATIVE
Comment: NEGATIVE
Comment: NORMAL
Neisseria Gonorrhea: NEGATIVE

## 2024-02-07 LAB — LIPID PANEL
Chol/HDL Ratio: 4.9 ratio (ref 0.0–5.0)
Cholesterol, Total: 146 mg/dL (ref 100–199)
HDL: 30 mg/dL — ABNORMAL LOW (ref >39)
LDL Chol Calc (NIH): 79 mg/dL (ref 0–99)
Triglycerides: 220 mg/dL — ABNORMAL HIGH (ref 0–149)
VLDL Cholesterol Cal: 37 mg/dL (ref 5–40)

## 2024-02-07 LAB — CBC
Hematocrit: 42.7 % (ref 37.5–51.0)
Hemoglobin: 13.7 g/dL (ref 13.0–17.7)
MCH: 30.3 pg (ref 26.6–33.0)
MCHC: 32.1 g/dL (ref 31.5–35.7)
MCV: 95 fL (ref 79–97)
Platelets: 278 x10E3/uL (ref 150–450)
RBC: 4.52 x10E6/uL (ref 4.14–5.80)
RDW: 13.1 % (ref 11.6–15.4)
WBC: 7 x10E3/uL (ref 3.4–10.8)

## 2024-02-07 LAB — COMPREHENSIVE METABOLIC PANEL WITH GFR
ALT: 34 IU/L (ref 0–44)
AST: 27 IU/L (ref 0–40)
Albumin: 4.8 g/dL (ref 3.8–4.9)
Alkaline Phosphatase: 36 IU/L — ABNORMAL LOW (ref 44–121)
BUN/Creatinine Ratio: 21 — ABNORMAL HIGH (ref 9–20)
BUN: 24 mg/dL (ref 6–24)
Bilirubin Total: 0.3 mg/dL (ref 0.0–1.2)
CO2: 20 mmol/L (ref 20–29)
Calcium: 10 mg/dL (ref 8.7–10.2)
Chloride: 103 mmol/L (ref 96–106)
Creatinine, Ser: 1.13 mg/dL (ref 0.76–1.27)
Globulin, Total: 2.2 g/dL (ref 1.5–4.5)
Glucose: 122 mg/dL — ABNORMAL HIGH (ref 70–99)
Potassium: 4.6 mmol/L (ref 3.5–5.2)
Sodium: 139 mmol/L (ref 134–144)
Total Protein: 7 g/dL (ref 6.0–8.5)
eGFR: 75 mL/min/1.73 (ref >59)

## 2024-02-07 LAB — RPR: RPR Ser Ql: NONREACTIVE

## 2024-02-07 LAB — HIV-1 RNA QUANT-NO REFLEX-BLD: HIV-1 RNA Viral Load: <20 {copies}/mL

## 2024-02-08 LAB — T-HELPER CELL (CD4) - (RCID CLINIC ONLY)
CD4 % Helper T Cell: 30 % — ABNORMAL LOW (ref 33–65)
CD4 T Cell Abs: 696 /uL (ref 400–1790)

## 2024-02-14 DIAGNOSIS — M7989 Other specified soft tissue disorders: Secondary | ICD-10-CM | POA: Diagnosis not present

## 2024-02-14 DIAGNOSIS — L03115 Cellulitis of right lower limb: Secondary | ICD-10-CM | POA: Diagnosis not present

## 2024-02-20 ENCOUNTER — Encounter: Payer: Self-pay | Admitting: Infectious Diseases

## 2024-02-20 ENCOUNTER — Ambulatory Visit: Admitting: Infectious Diseases

## 2024-02-20 VITALS — BP 132/85 | HR 66 | Temp 98.1°F | Ht 72.0 in | Wt 254.6 lb

## 2024-02-20 DIAGNOSIS — F418 Other specified anxiety disorders: Secondary | ICD-10-CM

## 2024-02-20 DIAGNOSIS — Z6834 Body mass index (BMI) 34.0-34.9, adult: Secondary | ICD-10-CM

## 2024-02-20 DIAGNOSIS — L97519 Non-pressure chronic ulcer of other part of right foot with unspecified severity: Secondary | ICD-10-CM

## 2024-02-20 DIAGNOSIS — E781 Pure hyperglyceridemia: Secondary | ICD-10-CM

## 2024-02-20 DIAGNOSIS — E11621 Type 2 diabetes mellitus with foot ulcer: Secondary | ICD-10-CM

## 2024-02-20 DIAGNOSIS — F341 Dysthymic disorder: Secondary | ICD-10-CM

## 2024-02-20 DIAGNOSIS — E66812 Obesity, class 2: Secondary | ICD-10-CM

## 2024-02-20 DIAGNOSIS — E785 Hyperlipidemia, unspecified: Secondary | ICD-10-CM

## 2024-02-20 DIAGNOSIS — B20 Human immunodeficiency virus [HIV] disease: Secondary | ICD-10-CM

## 2024-02-20 MED ORDER — LINEZOLID 600 MG PO TABS: 600.0000 mg | ORAL_TABLET | Freq: Two times a day (BID) | ORAL | 1 refills | Status: DC

## 2024-02-20 NOTE — Assessment & Plan Note (Addendum)
 Will send him to podiatry Will give him rx for zyvox  for 10 days.  Complete clinda today He will stop trazadone and lexapro while he is on this. Also, hold iron.

## 2024-02-20 NOTE — Assessment & Plan Note (Signed)
 Trig are improved from previous.  Will continue to watch.

## 2024-02-20 NOTE — Assessment & Plan Note (Signed)
 Holding Lexapro for now as well as trazadone.

## 2024-02-20 NOTE — Assessment & Plan Note (Addendum)
 Doing well Awaiting dental clinic.  No change in meds.  Labs show good viral control.  Flu and covid when available.  Rtc in 2 months.  Offered/refused condoms.  He's on statin.

## 2024-02-20 NOTE — Assessment & Plan Note (Signed)
 Losing wt on trulicity.

## 2024-02-20 NOTE — Progress Notes (Signed)
 Subjective:    Patient ID: Jon Shannon, male  DOB: 1966/05/18, 58 y.o.        MRN: [id number]   HPI 58 yo M with HIV+, previous stage IIa Hodgkin's lymphoma (2013), tx with ABVD 10-2011 with resultant neuropathy. He had onc f/u May 2021, clear.  Has DM2 as well from steroids. Severe hypertriglyceridemia. Has PCP in [place], J) .  Taking 4g fish oil, fenofibrate, and pravastatin. red rice yeast.  On triumeq .     A1C 5.7% (01-2024). Has gotten new glasses, has had eye exam. FSG has ranged 168-121   Has 2 new puppies, doing well. Very rambunctious.  No problems with ART.  Single.    Has fracture of R knee. Is wearing brace.  He had cellulitis 1 month ago. Had repeat episode and asked for clinda, took 7 days. Worsened, extended proximally as well as LE edema. Temp up to 103.9. He was given IM (ceftr?) and then 7 more days of clinda.  He had doppler done last week that was (-). At [place].  Applied neosporin and wrapped today.  Has wound on sole of R great toe, non-healing for several months.  May still get TKR.    He has been seen by neuro for his headache, stable recently.  He has been started on ozempeic.  Wt down 18#.    Helping take care of his elderly parents- dad died 2023/10/25. Still grieving.   HIV 1 RNA Quant (Copies/mL)  Date Value  01/05/2022 Not Detected  07/19/2021 Not Detected  10/04/2020 Not Detected   HIV-1 RNA Viral Load (copies/mL)  Date Value  02/06/2024 <20  08/08/2023 80  08/16/2022 <20   CD4 T Cell Abs (/uL)  Date Value  02/06/2024 696  08/08/2023 684  08/16/2022 837     Health Maintenance  Topic Date Due   Hepatitis B Vaccines (1 of 3 - 19+ 3-dose series) Never done   FOOT EXAM  09/20/2018   OPHTHALMOLOGY EXAM  08/31/2020   COVID-19 Vaccine (8 - 2024-25 season) 06/19/2023   INFLUENZA VACCINE  02/29/2024   HEMOGLOBIN A1C  08/08/2024   Colonoscopy  05/02/2025   DTaP/Tdap/Td (4 - Td or Tdap) 12/20/2031   Pneumococcal Vaccine 52-68  Years old  Completed   Hepatitis C Screening  Completed   HIV Screening  Completed   Zoster Vaccines- Shingrix  Completed   HPV VACCINES  Aged Out   Meningococcal B Vaccine  Aged Out      Review of Systems  Constitutional:  Positive for fever. Negative for weight loss.  Respiratory:  Negative for cough and shortness of breath.   Cardiovascular:  Negative for chest pain and leg swelling.  Gastrointestinal:  Negative for constipation and diarrhea.  Genitourinary:  Negative for dysuria.    Please see HPI. All other systems reviewed and negative.     Objective:  Physical Exam Vitals reviewed.  Constitutional:      Appearance: Normal appearance. He is obese.  HENT:     Mouth/Throat:     Mouth: Mucous membranes are moist.     Pharynx: No oropharyngeal exudate.  Eyes:     Extraocular Movements: Extraocular movements intact.     Pupils: Pupils are equal, round, and reactive to light.  Cardiovascular:     Rate and Rhythm: Normal rate and regular rhythm.  Pulmonary:     Effort: Pulmonary effort is normal.     Breath sounds: Normal breath sounds.  Abdominal:     General:  Bowel sounds are normal. There is no distension.     Palpations: Abdomen is soft.     Tenderness: There is no abdominal tenderness.  Musculoskeletal:       Feet:  Feet:     Left foot:     Skin integrity: Ulcer present.     Comments: Dime sized ulcer, no d/c, non-tender.  Skin:    General: Skin is warm.         Comments: Very mild erythema  Neurological:     General: No focal deficit present.     Mental Status: He is alert.  Psychiatric:        Mood and Affect: Mood normal.             Assessment & Plan:

## 2024-02-21 ENCOUNTER — Ambulatory Visit (INDEPENDENT_AMBULATORY_CARE_PROVIDER_SITE_OTHER)

## 2024-02-21 ENCOUNTER — Ambulatory Visit: Admitting: Podiatry

## 2024-02-21 ENCOUNTER — Encounter: Payer: Self-pay | Admitting: Podiatry

## 2024-02-21 DIAGNOSIS — L97512 Non-pressure chronic ulcer of other part of right foot with fat layer exposed: Secondary | ICD-10-CM | POA: Diagnosis not present

## 2024-02-21 DIAGNOSIS — E1140 Type 2 diabetes mellitus with diabetic neuropathy, unspecified: Secondary | ICD-10-CM

## 2024-02-21 DIAGNOSIS — L84 Corns and callosities: Secondary | ICD-10-CM

## 2024-02-21 DIAGNOSIS — M205X1 Other deformities of toe(s) (acquired), right foot: Secondary | ICD-10-CM

## 2024-02-21 MED ORDER — SILVER SULFADIAZINE 1 % EX CREA: 1.0000 | TOPICAL_CREAM | Freq: Every day | CUTANEOUS | 0 refills | Status: AC

## 2024-02-21 NOTE — Progress Notes (Signed)
 Chief Complaint  Patient presents with   Diabetic Ulcer    Left first toe, PCP started him on Zivox ABX.  It does not appear to be very big, but is open in the center. He states it comes and goe. It will heal then bust open again. Last A1c was 5.7, in July. ASA    HPI: 58 y.o. male presenting today as referral from infectious disease with concern for right first toe plantar medial IPJ recurrent ulceration.  Patient has history of diabetes with neuropathy, lymphoma undergoing treatment and well-controlled HIV.  Of note, last year he did have knee injury in which he sustained ligament damage and sustained fractures of tibial plateau and proximal fibula according to patient.  Does have upcoming plans for surgery for this.  He has been on Zyvox  for cellulitis of the right knee.  Past Medical History:  Diagnosis Date   Anxiety    Depression    Diabetes (HCC)    type II from chemotherapy   DJD (degenerative joint disease)    Elevated serum creatinine 06/15/2014   Fracture    every metatarsal in R foot, and 2nd metatarsal in L foot while walking   Hepatitis A    resolved.    HIV INFECTION 11/30/2009   no history of opportunistic infection   Hodgkin's lymphoma (HCC) 11/09/2011   Hx of radiation therapy 04/03/12 -04/29/12   Hodgkin's disease -neck   Hyperlipidemia 08/07/2011   HYPERTENSION 11/30/2009   Hypertriglyceridemia    Memory difficulty    chemo brain long and short term memory come and go   Neuropathy    from chemo and diabetes   PTSD (post-traumatic stress disorder)    Rectal bleeding 09/04/2013   Weight loss 05/29/2013   Past Surgical History:  Procedure Laterality Date   cancerous lymph node removal     left cervical node excisional biopsy  10/30/2011   left index finger trauma repair     [employer]     TONSILLECTOMY     as teenager   Allergies  Allergen Reactions   Shrimp [Shellfish Allergy] Anaphylaxis    Lobster, too   Cefdinir Other (See Comments)    Oral  ulcer, mild.      PHYSICAL EXAM: There were no vitals filed for this visit.  General: The patient is alert and oriented x3 in no acute distress.  Dermatology: Skin is warm, dry and supple bilateral lower extremities. Interspaces are clear of maceration and debris.      Wound 1:  Location: Right first toe plantar medial interphalangeal joint with hemorrhagic callus, pinpoint area of open ulceration with fat layer exposed.        Depth: Subcutaneous fat layer        Wound Border: Hyperkeratotic        Wound Base: Viable bleeding tissue bed        Drainage: Minimal       Odor?:  No        Surrounding Tissue: Stable appearing        Infected?:  No        Necrosis?:  No        Pain?:  No        Tunneling: No       Dimensions (cm):, 0.4 x 0.4 x 0.3 cm predebridement 0.6 x 0.6 x 0.3 cm postdebridement with increased bleeding tissue to wound bed     Vascular: DP and PT pedal pulses are palpable 2/4 bilaterally.  Capillary refill  intact to the digits.  Decreased pedal hair growth present.  No erythema or edema to the right foot.  Does have compressive Ace wrap applied to right knee and lower leg  Neurological: Light touch sensation decreased.  Protective sensation absent.  Vibratory sensation decreased.  Musculoskeletal Exam: Right foot hallux limitus noted.  There is also deformity of the right first toe interphalangeal joint with some lateral angulation and decreased range of motion available at the joint.  Pes planus foot type noted.  Hammertoe contractures of lesser digits.     Latest Ref Rng & Units 02/06/2024   11:16 AM  Hemoglobin A1C  Hemoglobin-A1c 4.8 - 5.6 % 5.7      RADIOGRAPHIC EXAM: Right foot 3 views weightbearing 02/17/2024 No osteolysis or cortical erosion at the level of the first toe interphalangeal joint to suggest osteomyelitis.  There is ankylosis across the first toe interphalangeal joint with total loss of joint space.  Mild to moderate bunion deformity.  Chronic  appearing degeneration of the second MPJ consistent with arthritis versus fibrous infarction hammertoes of lesser digits noted.  Pes planus foot type.  TN and [place] joint arthritis noted.  ASSESSMENT / PLAN OF CARE: 1. Ulcer of great toe, right, with fat layer exposed (HCC)   2. Hallux limitus of right foot      Meds ordered this encounter  Medications   silver  sulfADIAZINE  (SILVADENE ) 1 % cream    Sig: Apply 1 Application topically daily.    Dispense:  50 g    Refill:  0   None  # Right first toe interphalangeal joint ulceration The ulceration was sharply debrided of hyperkeratotic and devitalized soft tissue with sterile #312 blade to the level of subcutaneous tissue.  Hemostasis obtained.  Antibiotic cream and DSD applied.  Reviewed off-loading with patient.  Offloading felt padding applied  Reviewed daily dressing changes with patient.  -Prescription for Silvadene  sent to patient's pharmacy  Discussed risks / concerns regarding ulcer with patient and possible sequelae if left untreated.  Stressed importance of infection prevention at home. Short-term goals are: prevent infection, off-load ulcer, heal ulcer Long-term goals are:  prevent recurrence, prevent amputation.   # Right foot hallux rigidus with first toe interphalangeal joint deformity - Radiographs reviewed with patient - Per patient history of fracture to the first toe which was treated nonsurgically.  Ulceration has developed at this point ever since. - Did discuss need for constant offloading of this area to facilitate healing of the ulcer.  Did briefly discuss surgery including Keller bunionectomy with cheilectomy versus interphalangeal joint arthroplasty and Lengthening procedure - May need to consider surgical intervention due to the deformity preventing lasting healing of the ulcer and upcoming plans for knee surgery - Will discuss this more at follow-up -I certify that this diagnosis represents a distinct and  separate diagnosis that requires evaluation and treatment separate from other procedures or diagnosis   # Diabetes with neuropathy Patient educated on diabetes. Discussed proper diabetic foot care and discussed risks and complications of disease. Educated patient in depth on reasons to return to the office immediately should he/she discover anything concerning or new on the feet. All questions answered. Discussed proper shoes as well.  - Immunocompromised state from cancer treatment and from HIV as well   Return in about 3 weeks (around 03/13/2024) for Wound Care.   Ethan LITTIE Saddler, DPM, AACFAS [facility]     [address]  [place], [place] [address]                Office [phone]  Fax [phone]

## 2024-03-10 ENCOUNTER — Encounter: Payer: Self-pay | Admitting: Podiatry

## 2024-03-10 ENCOUNTER — Ambulatory Visit (INDEPENDENT_AMBULATORY_CARE_PROVIDER_SITE_OTHER): Admitting: Podiatry

## 2024-03-10 DIAGNOSIS — L97512 Non-pressure chronic ulcer of other part of right foot with fat layer exposed: Secondary | ICD-10-CM

## 2024-03-10 DIAGNOSIS — M205X1 Other deformities of toe(s) (acquired), right foot: Secondary | ICD-10-CM

## 2024-03-10 DIAGNOSIS — E1140 Type 2 diabetes mellitus with diabetic neuropathy, unspecified: Secondary | ICD-10-CM

## 2024-03-10 NOTE — Progress Notes (Signed)
 Chief Complaint  Patient presents with   Wound Check    Right hallux ulcer, skin around is macerated, calloused, and open. It was covered by a band aid with cushions. Last A1c 5.6 in July, ASA.     HPI: 58 y.o. male presenting today for right first toe interphalangeal joint ulceration.  He reports completing antibiotics from infectious disease.  Has been keeping up with dressing changes.  Denies any pain to the area.  Past Medical History:  Diagnosis Date   Anxiety    Depression    Diabetes (HCC)    type II from chemotherapy   DJD (degenerative joint disease)    Elevated serum creatinine 06/15/2014   Fracture    every metatarsal in R foot, and 2nd metatarsal in L foot while walking   Hepatitis A    resolved.    HIV INFECTION 11/30/2009   no history of opportunistic infection   Hodgkin's lymphoma (HCC) 11/09/2011   Hx of radiation therapy 04/03/12 -04/29/12   Hodgkin's disease -neck   Hyperlipidemia 08/07/2011   HYPERTENSION 11/30/2009   Hypertriglyceridemia    Memory difficulty    chemo brain long and short term memory come and go   Neuropathy    from chemo and diabetes   PTSD (post-traumatic stress disorder)    Rectal bleeding 09/04/2013   Weight loss 05/29/2013   Past Surgical History:  Procedure Laterality Date   cancerous lymph node removal     left cervical node excisional biopsy  10/30/2011   left index finger trauma repair     [employer]     TONSILLECTOMY     as teenager   Allergies  Allergen Reactions   Shrimp [Shellfish Allergy] Anaphylaxis    Lobster, too   Cefdinir Other (See Comments)    Oral ulcer, mild.      PHYSICAL EXAM: There were no vitals filed for this visit.  General: The patient is alert and oriented x3 in no acute distress.  Dermatology: Skin is warm, dry and supple bilateral lower extremities. Interspaces are clear of maceration and debris.      Wound 1:  Location: Right first toe plantar medial interphalangeal joint with  hemorrhagic callus, pinpoint area of open ulceration with fat layer exposed.        Depth: Subcutaneous fat layer        Wound Border: Hyperkeratotic        Wound Base: Viable bleeding tissue bed        Drainage: Minimal       Odor?:  No        Surrounding Tissue: Stable appearing        Infected?:  No        Necrosis?:  No        Pain?:  No        Tunneling: No       Dimensions (cm):, 1.0 x 0.4 x 0.2 cm predebridement, postdebridement 1.2 x 0.6 x 0.3 cm.  Enlargement of the ulceration noted.     Vascular: DP and PT pedal pulses are palpable 2/4 bilaterally.  Capillary refill intact to the digits.  Decreased pedal hair growth present.  No erythema or edema to the right foot.  Does have compressive Ace wrap applied to right knee and lower leg  Neurological: Light touch sensation decreased.  Protective sensation absent.  Vibratory sensation decreased.  Musculoskeletal Exam: Right foot hallux limitus noted.  There is also deformity of the right first toe interphalangeal joint with some  lateral angulation and decreased range of motion available at the joint.  Pes planus foot type noted.  Hammertoe contractures of lesser digits.     Latest Ref Rng & Units 02/06/2024   11:16 AM  Hemoglobin A1C  Hemoglobin-A1c 4.8 - 5.6 % 5.7      RADIOGRAPHIC EXAM: Right foot 3 views weightbearing 02/17/2024 No osteolysis or cortical erosion at the level of the first toe interphalangeal joint to suggest osteomyelitis.  There is ankylosis across the first toe interphalangeal joint with total loss of joint space.  Mild to moderate bunion deformity.  Chronic appearing degeneration of the second MPJ consistent with arthritis versus fibrous infarction hammertoes of lesser digits noted.  Pes planus foot type.  TN and [place] joint arthritis noted.  ASSESSMENT / PLAN OF CARE: 1. Ulcer of great toe, right, with fat layer exposed (HCC)   2. Hallux limitus of right foot   3. Type 2 diabetes mellitus with diabetic  neuropathy, unspecified whether long term insulin use (HCC)       No orders of the defined types were placed in this encounter.  None  # Right first toe interphalangeal joint ulceration The ulceration was sharply debrided of hyperkeratotic and devitalized soft tissue with sterile #312 blade to the level of subcutaneous tissue.  Hemostasis obtained.  Antibiotic cream and DSD applied.  Reviewed off-loading with patient.  Offloading felt padding applied  Reviewed daily dressing changes with patient.  - Continue daily dressing changes with Silvadene  - Did dispense surgical shoe today to aid in offloading. - Did discuss potential need for surgery to offload the ulceration due to the first toe deformity and the hallux limitus.  Discussed risks / concerns regarding ulcer with patient and possible sequelae if left untreated.  Stressed importance of infection prevention at home. Short-term goals are: prevent infection, off-load ulcer, heal ulcer Long-term goals are:  prevent recurrence, prevent amputation.    Return in about 2 weeks (around 03/24/2024) for Wound Care.   Ethan LITTIE Saddler, DPM, AACFAS [facility]     [address] [place], [place] [address]                Office [phone]  Fax [phone]

## 2024-03-10 NOTE — Patient Instructions (Signed)
 Apply a Betadine wet to dry gauze dressing secured in place with light Coban under minimal compression daily.  Okay to wash the area with soapy water.  Maintain use of surgical shoe.  Offload the ulceration using horseshoe pads or callus pads.  Look for these on [employer] or from [employer].  Monitor for signs of infection.

## 2024-03-11 ENCOUNTER — Telehealth: Admitting: Adult Health

## 2024-03-11 DIAGNOSIS — G43009 Migraine without aura, not intractable, without status migrainosus: Secondary | ICD-10-CM | POA: Diagnosis not present

## 2024-03-11 DIAGNOSIS — G43709 Chronic migraine without aura, not intractable, without status migrainosus: Secondary | ICD-10-CM

## 2024-03-11 NOTE — Patient Instructions (Signed)
 Your Plan:  Continue Emgality  monthly injection      Thank you for coming to see us  at [facility]. I hope we have been able to provide you high quality care today.  You may receive a patient satisfaction survey over the next few weeks. We would appreciate your feedback and comments so that we may continue to improve ourselves and the health of our patients.

## 2024-03-11 NOTE — Progress Notes (Signed)
 PATIENT: Jon Shannon DOB: 10-11-1965  REASON FOR VISIT: follow up HISTORY FROM: patient  Virtual Visit via Video Note  I connected with Jon Shannon on 03/11/24 at  1:15 PM EDT by a video enabled telemedicine application located remotely at [facility] and verified that I am speaking with the correct person using two identifiers who was located at their own home in [place].    I discussed the limitations of evaluation and management by telemedicine and the availability of in person appointments. The patient expressed understanding and agreed to proceed.   PATIENT: Jon Shannon DOB: Oct 06, 1965  REASON FOR VISIT: follow up HISTORY FROM: patient  HISTORY OF PRESENT ILLNESS: Today 03/11/24:  Jon Shannon is a 58 y.o. male with a history of migraine headaches. Returns today for follow-up.  He remains on Emgality .  Reports that he took his next injection today.  Overall he states has been working well for his migraines.  He does typically inject in the right arm.  He does state that he noticed some shoulder pain was not sure if it was related to the injection.  Did advise that he could change injection sites and see if that helps.  Location:frontal region that radiates to the occipital region Frequency: 1-2 headaches a month  Duration: Uses Tylenol  or ibuprofen  usually resolves the headache fairly quickly Aura: no Photophonia: yes Phonophobia: yes Nausea: no Vomiting: no Numbness: no Weakness: no Visual changes:no   08/31/23: Jon Shannon is a 58 y.o. male who has been followed in this office for Migraines. Returns today for follow-up.  He reports that he has been taking Emgality .  His last injection was January 14.  He states that he has not had any migraines since his last injection.  Reports he does get stress headaches.  Also reports that he has cellulitis has been running temperatures.  This is also caused him to have a mild headache. He states  that he is on antibiotics.  He joins me today for virtual visit    HISTORY Patient is here for headaches. Jon Shannon and Jon Shannon   9//2024: Still having headaches, went away for about a month and then came back about a month after the infusion. 3/10. He has a lot of problems with sleep. He doesn't sleep until 2-3am. Lots of stress. Doesn't know if he snores denies wanting to nap (he is large, obes, large neck, mallampati 3. Dealing with a sinus infection for a week and woke up with a dry mouth. He was checked for sleep apnea several years ago.Headache came back in June. Right now on the top of the head and behind the left eye. Slow pulse, throbbing, daily, more when upset and stressed out and depressed. 3-4 ibuprofen  once a day for his leg and foot pain maybe twice a day. Doesn't wake up with them. Start mid morning or a few hours after he wakes up. Dark room hel[ps. Nausea. Stress. Light sensitivity. Has tinnitus in his ears and it really bothers him. He has daily migraines for > 3 months can last all day. Sleep in a dark room helps. Refuses sleep referral for possible OSA . They get to moderate to severe every day. We gave him a migraine cocktail, pain went to 0 started him on emgality . No other focal neurologic deficits, associated symptoms, inciting events or modifiable factors.     Follow up 12/07/2022:  58 y.o. male here as requested by Jon Shannon LABOR, MD for daily headaches, past  medical history of syphilis in 2021 diagnosed for chlamydia history of trichomonas GC in 1990 admits to male and male partners, type 2 diabetes with hypertriglyceridemia, hyperlipidemia mixed, depression, ED, peripheral neuropathy due to chemotherapy, HIV positive Dr. Eben, personal history of Hodgkin's lymphoma step 2 localized to the bilateral neck sees Dr. Lazaro such, disabled due to neuropathy depression and HIV   This is a patient without a history of migraines, with a severe headache for 7 to 9 weeks.  I do not  believe this is a primary headache that this is secondary. MRI was negative in [place]. I have been working Dr. Eben, much appreciate my colleague, in infectious disease, and we performed an LP which has been unremarkable except for an increase in lymphocytes. All other testing (extensive) coming back negative. I believe this may be something viral, aseptic meningitis, we are still waiting on all the tests. I sent him to ophthalmology urgently as well.    Headaches are slightly improving but still contonuous, he didn't sleep last night, he is extremely upset. We went through all the labs, appears to be a viral meningitis. I spoke to Dr. Eben who has been wonderful and we brought him to get an infusio of depakote, solumedrol, toradol  and zofran  which helped exceptionally down to a 0/10. I warned patient the solumedrol mayincrease his glucose, needs to watch that but he was very happy when he left. If the headache returns I will perform nerve blocks told him to call us .   Across the forehead. Throbbing. Pulsating. No Light and sound sensitivity. Some are long, continuous, some are sharp, stabbing pains. They can vary in location. The last headache started last Wednesday night at 10 pm and ended last night.      Meds tried > 3 months: Nurtec samples (not helpful and made him nauseous), Tylenol , Ibuprofen , Lexapro, Metoprolol XL, Lisinopril., flexeril , decadron , lexapro, magnesium, zofran , compazine , trazodone, topiramate, amitriptyline, rizatriptan, sumatriptan, Aimovig contraindicated due to constipation      REVIEW OF SYSTEMS: Out of a complete 14 system review of symptoms, the patient complains only of the following symptoms, and all other reviewed systems are negative.  ALLERGIES: Allergies  Allergen Reactions   Shrimp [Shellfish Allergy] Anaphylaxis    Lobster, too   Cefdinir Other (See Comments)    Oral ulcer, mild.     HOME MEDICATIONS: Outpatient Medications Prior to Visit   Medication Sig Dispense Refill   aspirin 81 MG tablet Take 81 mg by mouth daily.     B Complex-C (B-COMPLEX WITH VITAMIN C) tablet Take 2 tablets by mouth daily.      bisoprolol-hydrochlorothiazide (ZIAC) 5-6.25 MG tablet Take 1 tablet by mouth daily.     Chromium Picolinate (CHROMIUM PICOLATE PO) Take 2 tablets by mouth 2 (two) times daily.      CINNAMON PO Take 2 capsules by mouth 2 (two) times daily.      diclofenac (VOLTAREN) 75 MG EC tablet Take 75 mg by mouth 2 (two) times daily.     Dulaglutide (TRULICITY) 1.5 MG/0.5ML SOPN Inject 3 mg into the skin once a week.     escitalopram (LEXAPRO) 20 MG tablet Take 20 mg by mouth daily.   0   fenofibrate 160 MG tablet Take 160 mg by mouth at bedtime.     fish oil-omega-3 fatty acids 1000 MG capsule Take 2 g by mouth 2 (two) times daily. Two capsules BID     Galcanezumab -gnlm (EMGALITY ) 120 MG/ML SOAJ Inject 120 mg into the  skin every 30 (thirty) days. 1 mL 11   GARLIC OIL PO Take 2 tablets by mouth daily.      INVOKAMET [phone] MG TABS Take 1 tablet by mouth daily.     linezolid  (ZYVOX ) 600 MG tablet Take 1 tablet (600 mg total) by mouth 2 (two) times daily. 20 tablet 1   lisinopril (PRINIVIL,ZESTRIL) 5 MG tablet Take 5 mg by mouth daily.     loratadine (CLARITIN) 10 MG tablet Take 20 mg by mouth daily.     MELATONIN PO Take by mouth.     metoprolol succinate (TOPROL-XL) 50 MG 24 hr tablet Take by mouth daily.   0   pravastatin (PRAVACHOL) 40 MG tablet Take 40 mg by mouth daily.     silver  sulfADIAZINE  (SILVADENE ) 1 % cream Apply 1 Application topically daily. 50 g 0   tamsulosin (FLOMAX) 0.4 MG CAPS capsule Take 0.4 mg by mouth.     traZODone (DESYREL) 50 MG tablet TAKE 1-2 TABLETS AT BEDTIME AS NEEDED FOR SLEEP  5   TRIUMEQ  600-50-300 MG tablet TAKE 1 TABLET BY MOUTH DAILY WITH BREAKFAST 90 tablet 3   UNABLE TO FIND at bedtime. Med Name: Blood Sugar Support     vitamin C (ASCORBIC ACID) 500 MG tablet Take 1,000 mg by mouth 2 (two) times  daily.      VITAMIN D PO Take 2 tablets by mouth 2 (two) times daily.     No facility-administered medications prior to visit.    PAST MEDICAL HISTORY: Past Medical History:  Diagnosis Date   Anxiety    Depression    Diabetes (HCC)    type II from chemotherapy   DJD (degenerative joint disease)    Elevated serum creatinine 06/15/2014   Fracture    every metatarsal in R foot, and 2nd metatarsal in L foot while walking   Hepatitis A    resolved.    HIV INFECTION 11/30/2009   no history of opportunistic infection   Hodgkin's lymphoma (HCC) 11/09/2011   Hx of radiation therapy 04/03/12 -04/29/12   Hodgkin's disease -neck   Hyperlipidemia 08/07/2011   HYPERTENSION 11/30/2009   Hypertriglyceridemia    Memory difficulty    chemo brain long and short term memory come and go   Neuropathy    from chemo and diabetes   PTSD (post-traumatic stress disorder)    Rectal bleeding 09/04/2013   Weight loss 05/29/2013    PAST SURGICAL HISTORY: Past Surgical History:  Procedure Laterality Date   cancerous lymph node removal     left cervical node excisional biopsy  10/30/2011   left index finger trauma repair     [employer]     TONSILLECTOMY     as teenager    FAMILY HISTORY: Family History  Problem Relation Age of Onset   Cancer Mother 4       breast cancer    Seizures Mother    Hypertension Mother    Hyperlipidemia Mother    Diabetes Father    Hypertension Father    Hyperlipidemia Father    Cancer Father        brain tumor (unknown type)   Heart attack Father 71       fatal   Cancer Maternal Grandmother        CLL   Cancer Maternal Aunt        lung cancer   Migraines Neg Hx     SOCIAL HISTORY: Social History   Socioeconomic History   Marital status:  Single    Spouse name: Not on file   Number of children: 0   Years of education: 18   Highest education level: Bachelor's degree (e.g., BA, AB, BS)  Occupational History   Occupation: Job Acupuncturist:  [employer]  Tobacco Use   Smoking status: Former    Current packs/day: 0.00    Types: Cigarettes    Quit date: 07/01/2011    Years since quitting: 12.7   Smokeless tobacco: Former    Quit date: 2012  Vaping Use   Vaping status: Never Used  Substance and Sexual Activity   Alcohol use: Not Currently   Drug use: No   Sexual activity: Not Currently    Comment: pt. declined condoms  Other Topics Concern   Not on file  Social History Narrative   Lives with his parents, pays rent   Right handed   Caffeine: 1 glass tea or 1 cup coffee in the morning   Social Drivers of Corporate investment banker Strain: Not on file  Food Insecurity: Not on file  Transportation Needs: Not on file  Physical Activity: Not on file  Stress: Not on file  Social Connections: Not on file  Intimate Partner Violence: Not on file      PHYSICAL EXAM Generalized: Well developed, in no acute distress   Neurological examination  Mentation: Alert oriented to time, place, history taking. Follows all commands speech and language fluent Cranial nerve II-XII:Extraocular movements were full. Facial symmetry noted.   DIAGNOSTIC DATA (LABS, IMAGING, TESTING) - I reviewed patient records, labs, notes, testing and imaging myself where available.  Lab Results  Component Value Date   WBC 7.0 02/06/2024   HGB 13.7 02/06/2024   HCT 42.7 02/06/2024   MCV 95 02/06/2024   PLT 278 02/06/2024      Component Value Date/Time   NA 139 02/06/2024 1116   NA 139 12/14/2016 1339   K 4.6 02/06/2024 1116   K 4.2 12/14/2016 1339   CL 103 02/06/2024 1116   CL 100 09/16/2012 1514   CO2 20 02/06/2024 1116   CO2 27 12/14/2016 1339   GLUCOSE 122 (H) 02/06/2024 1116   GLUCOSE 168 (H) 03/27/2023 1003   GLUCOSE 103 12/14/2016 1339   GLUCOSE 116 (H) 09/16/2012 1514   BUN 24 02/06/2024 1116   BUN 12.8 12/14/2016 1339   CREATININE 1.13 02/06/2024 1116   CREATININE 0.93 03/27/2023 1003   CREATININE 1.04 01/05/2022 0938    CREATININE 1.1 12/14/2016 1339   CALCIUM 10.0 02/06/2024 1116   CALCIUM 9.9 12/14/2016 1339   PROT 7.0 02/06/2024 1116   PROT 7.7 12/14/2016 1339   ALBUMIN 4.8 02/06/2024 1116   ALBUMIN 4.6 12/14/2016 1339   AST 27 02/06/2024 1116   AST 25 03/27/2023 1003   AST 35 (H) 12/14/2016 1339   ALT 34 02/06/2024 1116   ALT 36 03/27/2023 1003   ALT 59 (H) 12/14/2016 1339   ALKPHOS 36 (L) 02/06/2024 1116   ALKPHOS 44 12/14/2016 1339   BILITOT 0.3 02/06/2024 1116   BILITOT 0.3 03/27/2023 1003   BILITOT 0.43 12/14/2016 1339   GFRNONAA >60 03/27/2023 1003   GFRNONAA 68 08/22/2017 1049   GFRAA >60 12/28/2017 1412   GFRAA 79 08/22/2017 1049   Lab Results  Component Value Date   CHOL 146 02/06/2024   HDL 30 (L) 02/06/2024   LDLCALC 79 02/06/2024   TRIG 220 (H) 02/06/2024   CHOLHDL 4.9 02/06/2024   Lab Results  Component Value Date   HGBA1C 5.7 (H) 02/06/2024   No results found for: VITAMINB12 Lab Results  Component Value Date   TSH 3.79 12/25/2019      ASSESSMENT AND PLAN 58 y.o. year old male  has a past medical history of Anxiety, Depression, Diabetes (HCC), DJD (degenerative joint disease), Elevated serum creatinine (06/15/2014), Fracture, Hepatitis A, HIV INFECTION (11/30/2009), Hodgkin's lymphoma (HCC) (11/09/2011), radiation therapy (04/03/12 -04/29/12), Hyperlipidemia (08/07/2011), HYPERTENSION (11/30/2009), Hypertriglyceridemia, Memory difficulty, Neuropathy, PTSD (post-traumatic stress disorder), Rectal bleeding (09/04/2013), and Weight loss (05/29/2013). here with:  1.  Migraine headaches  -Continue Emgality  120 mg monthly injections -Advised if headaches worsen or he develops new symptoms he should let us  know. -Follow-up in 1 year or sooner if needed   Duwaine Russell, MSN, NP-C 03/11/2024, 1:21 PM [facility] [address] [place], [place] [address] [phone]  The patient's condition requires frequent monitoring and adjustments in the  treatment plan, reflecting the ongoing complexity of care.  This provider is the continuing focal point for all needed services for this condition.

## 2024-03-13 ENCOUNTER — Telehealth: Payer: Medicare HMO | Admitting: Adult Health

## 2024-03-17 DIAGNOSIS — M1711 Unilateral primary osteoarthritis, right knee: Secondary | ICD-10-CM | POA: Diagnosis not present

## 2024-03-24 ENCOUNTER — Ambulatory Visit (INDEPENDENT_AMBULATORY_CARE_PROVIDER_SITE_OTHER): Admitting: Podiatry

## 2024-03-24 ENCOUNTER — Encounter: Payer: Self-pay | Admitting: Podiatry

## 2024-03-24 DIAGNOSIS — M205X1 Other deformities of toe(s) (acquired), right foot: Secondary | ICD-10-CM | POA: Diagnosis not present

## 2024-03-24 DIAGNOSIS — M2061 Acquired deformities of toe(s), unspecified, right foot: Secondary | ICD-10-CM

## 2024-03-24 DIAGNOSIS — L97512 Non-pressure chronic ulcer of other part of right foot with fat layer exposed: Secondary | ICD-10-CM | POA: Diagnosis not present

## 2024-03-24 DIAGNOSIS — M2031 Hallux varus (acquired), right foot: Secondary | ICD-10-CM

## 2024-03-24 NOTE — Progress Notes (Unsigned)
 Chief Complaint  Patient presents with   Diabetic Ulcer    Ulcer of the right hallux. Open, bleeding and painful. He feels it is getting worse.  Before debridement measurements: W:1.5 L:1.5 D: 0.2 A1c 5.9, ASA    HPI: 58 y.o. male presenting today for right first toe interphalangeal joint ulceration.  He reports recurrent cellulitis of the right leg proximally and states he is back on antibiotics.  Has been keeping up with dressing changes.  Denies any pain to the area. The wound does appear enlarged from previous. He is potentially undergoing right knee replacement later on this fall or winter.  Past Medical History:  Diagnosis Date   Anxiety    Depression    Diabetes (HCC)    type II from chemotherapy   DJD (degenerative joint disease)    Elevated serum creatinine 06/15/2014   Fracture    every metatarsal in R foot, and 2nd metatarsal in L foot while walking   Hepatitis A    resolved.    HIV INFECTION 11/30/2009   no history of opportunistic infection   Hodgkin's lymphoma (HCC) 11/09/2011   Hx of radiation therapy 04/03/12 -04/29/12   Hodgkin's disease -neck   Hyperlipidemia 08/07/2011   HYPERTENSION 11/30/2009   Hypertriglyceridemia    Memory difficulty    chemo brain long and short term memory come and go   Neuropathy    from chemo and diabetes   PTSD (post-traumatic stress disorder)    Rectal bleeding 09/04/2013   Weight loss 05/29/2013   Past Surgical History:  Procedure Laterality Date   cancerous lymph node removal     left cervical node excisional biopsy  10/30/2011   left index finger trauma repair     [employer]     TONSILLECTOMY     as teenager   Allergies  Allergen Reactions   Shrimp [Shellfish Allergy] Anaphylaxis    Lobster, too   Cefdinir Other (See Comments)    Oral ulcer, mild.      PHYSICAL EXAM: There were no vitals filed for this visit.  General: The patient is alert and oriented x3 in no acute distress.  Dermatology: Skin is warm,  dry and supple bilateral lower extremities. Interspaces are clear of maceration and debris.      Wound 1:  Location: Right first toe plantar medial interphalangeal joint with surrounding hemorrhagic callus, subcutaneous tissue involvement with predominant granulation tissue wound bed.  Scant drainage, no surrounding erythema.       Dimensions (cm):, 1.5 x 0.5 x 0.2 cm predebridement, Enlargement of the ulceration noted.  Postdebridement measurements 1.8 x 1.8 x 0.3 cm.     Vascular: DP and PT pedal pulses are palpable 2/4 bilaterally.  Capillary refill intact to the digits.  Decreased pedal hair growth present.  No erythema or edema to the right foot.  Does have compressive Ace wrap applied to right knee and lower leg  Neurological: Light touch sensation decreased.  Protective sensation absent.  Vibratory sensation decreased.  Musculoskeletal Exam: Right foot hallux limitus noted.  There is also deformity of the right first toe interphalangeal joint with some lateral angulation and significantly decreased range of motion available at the joint and at the 1st MPJ.  Pes planus foot type noted.  Hammertoe contractures of lesser digits.     Latest Ref Rng & Units 02/06/2024   11:16 AM  Hemoglobin A1C  Hemoglobin-A1c 4.8 - 5.6 % 5.7      RADIOGRAPHIC EXAM: Right foot 3 views weightbearing  02/17/2024 No osteolysis or cortical erosion at the level of the first toe interphalangeal joint to suggest osteomyelitis.  There is ankylosis across the first toe interphalangeal joint with total loss of joint space.  Mild to moderate bunion deformity.  Chronic appearing degeneration of the second MPJ consistent with arthritis versus fibrous infarction hammertoes of lesser digits noted.  Pes planus foot type.  TN and  joint arthritis noted.  ASSESSMENT / PLAN OF CARE: 1. Hallux limitus of right foot   2. Ulcer of great toe, right, with fat layer exposed (HCC)   3. Acquired deformity of right toe   4. Hallux  malleus of right foot       No orders of the defined types were placed in this encounter.  None  # Right first toe interphalangeal joint ulceration The ulceration was sharply debrided of hyperkeratotic and devitalized soft tissue with sterile #312 blade to the level of subcutaneous tissue.  Hemostasis obtained.  Antibiotic cream and DSD applied.  Reviewed off-loading with patient.  Offloading felt padding applied  Reviewed daily dressing changes with patient.  - Continue daily dressing changes with Silvadene  - Continue surgical shoe with offloading felt padding. Emphasized importance of limited stance and gait. - Concerned with wound progression, see below for discussion around Hallux limitus and right 1st toe deformity  Discussed risks / concerns regarding ulcer with patient and possible sequelae if left untreated.  Stressed importance of infection prevention at home. Short-term goals are: prevent infection, off-load ulcer, heal ulcer Long-term goals are:  prevent recurrence, prevent amputation.   # Right first toe deformity with ankylosis, arthritis, hallux malleus # Right hallux limitus - Reviewed prior radiographs with patient in which there is ankylosis of the left first toe interphalangeal joint from old injury and resultant hallux malleus deformity. - There is also first ray elevation with dorsal flag sign further limiting first MPJ range of motion - Does have severe pes planus likely secondary to chronic charcot neuroarthropathy. - Wound complicated by diabetes with neuropathy. - Have had difficulty offloading the ulceration at the right first toe interphalange joint. - Recommend surgical intervention describing first toe interphalangeal joint arthroplasty with first metatarsal head cheilectomy to improve range of motion of the first toe and decrease ground reactive forces to the interphalangeal joint inside of ulceration, prevent infection and limb loss. Would also debride the  ulceration as necessary intraoperatively. - Did recommend calf lengthening procedure as well which patient is refusing at this time due to concern for time nonweightbearing associated with this.  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  The aforementioned procedures to the first toe would allow him to continue weightbearing in surgical shoe during postoperative course. -Informed surgical risk consent was reviewed and read aloud to the patient.  I reviewed the films.  I have discussed my findings with the patient in great detail.  I have discussed all risks including but not limited to infection, stiffness, scarring, limp, disability, deformity, damage to blood vessels and nerves, numbness, poor healing, need for braces, arthritis, chronic pain, amputation, death.  All benefits and realistic expectations discussed in great detail.  I have made no promises as to the outcome.  I have provided realistic expectations. The patient has elected to proceed and expressed good understanding. Written consent obtained -He does have surgical shoe currently - Surgery scheduling to contact patient, likely to proceed at surgery center. -I certify that this diagnosis represents a distinct and separate diagnosis  that requires evaluation and treatment separate from other procedures or diagnosis    Return in about 2 weeks (around 04/07/2024) for Wound Care.   Ethan LITTIE Saddler, DPM, AACFAS [facility]     [address] [place], [place] [address]                Office [phone]  Fax [phone]

## 2024-03-26 ENCOUNTER — Other Ambulatory Visit: Payer: Self-pay | Admitting: *Deleted

## 2024-03-26 DIAGNOSIS — Z8571 Personal history of Hodgkin lymphoma: Secondary | ICD-10-CM

## 2024-03-27 ENCOUNTER — Encounter: Payer: Self-pay | Admitting: Hematology and Oncology

## 2024-03-27 ENCOUNTER — Inpatient Hospital Stay: Payer: Medicare HMO | Attending: Hematology and Oncology

## 2024-03-27 ENCOUNTER — Inpatient Hospital Stay (HOSPITAL_BASED_OUTPATIENT_CLINIC_OR_DEPARTMENT_OTHER): Payer: Medicare HMO | Admitting: Hematology and Oncology

## 2024-03-27 VITALS — BP 133/78 | HR 65 | Temp 97.7°F | Resp 18 | Ht 72.0 in | Wt 258.2 lb

## 2024-03-27 DIAGNOSIS — Z8571 Personal history of Hodgkin lymphoma: Secondary | ICD-10-CM | POA: Insufficient documentation

## 2024-03-27 DIAGNOSIS — E119 Type 2 diabetes mellitus without complications: Secondary | ICD-10-CM | POA: Insufficient documentation

## 2024-03-27 LAB — CBC WITH DIFFERENTIAL (CANCER CENTER ONLY)
Abs Immature Granulocytes: 0.1 K/uL — ABNORMAL HIGH (ref 0.00–0.07)
Basophils Absolute: 0.1 K/uL (ref 0.0–0.1)
Basophils Relative: 1 %
Eosinophils Absolute: 0.1 K/uL (ref 0.0–0.5)
Eosinophils Relative: 1 %
HCT: 41.2 % (ref 39.0–52.0)
Hemoglobin: 14.1 g/dL (ref 13.0–17.0)
Immature Granulocytes: 1 %
Lymphocytes Relative: 46 %
Lymphs Abs: 3.2 K/uL (ref 0.7–4.0)
MCH: 30.5 pg (ref 26.0–34.0)
MCHC: 34.2 g/dL (ref 30.0–36.0)
MCV: 89.2 fL (ref 80.0–100.0)
Monocytes Absolute: 0.4 K/uL (ref 0.1–1.0)
Monocytes Relative: 6 %
Neutro Abs: 3.2 K/uL (ref 1.7–7.7)
Neutrophils Relative %: 45 %
Platelet Count: 228 K/uL (ref 150–400)
RBC: 4.62 MIL/uL (ref 4.22–5.81)
RDW: 12.6 % (ref 11.5–15.5)
WBC Count: 7.1 K/uL (ref 4.0–10.5)
nRBC: 0 % (ref 0.0–0.2)

## 2024-03-27 LAB — CMP (CANCER CENTER ONLY)
ALT: 33 U/L (ref 0–44)
AST: 25 U/L (ref 15–41)
Albumin: 4.5 g/dL (ref 3.5–5.0)
Alkaline Phosphatase: 33 U/L — ABNORMAL LOW (ref 38–126)
Anion gap: 8 (ref 5–15)
BUN: 20 mg/dL (ref 6–20)
CO2: 28 mmol/L (ref 22–32)
Calcium: 10 mg/dL (ref 8.9–10.3)
Chloride: 104 mmol/L (ref 98–111)
Creatinine: 1.1 mg/dL (ref 0.61–1.24)
GFR, Estimated: >60 mL/min (ref >60)
Glucose, Bld: 135 mg/dL — ABNORMAL HIGH (ref 70–99)
Potassium: 3.8 mmol/L (ref 3.5–5.1)
Sodium: 140 mmol/L (ref 135–145)
Total Bilirubin: 0.4 mg/dL (ref 0.0–1.2)
Total Protein: 7.1 g/dL (ref 6.5–8.1)

## 2024-03-27 NOTE — Progress Notes (Signed)
 [facility] OFFICE PROGRESS NOTE  Patient Care Team: Fernand Tracey LABOR, MD as PCP - General (Family Medicine) Eben Reyes BROCKS, MD as PCP - Infectious Diseases (Infectious Diseases) Carlie Clark, MD (Otolaryngology) Fernand Tracey LABOR, MD as Referring Physician (Family Medicine)  Assessment & Plan History of Hodgkin's lymphoma The patient is a long-term cancer survivor Clinically, he is not symptomatic His CBC is completely normal  I do not see a good reason to order repeat staging imaging studies in the absence of symptoms We discussed possible discontinuation of long-term follow-up but the patient would like to return here for few more years for close observation I will see him next year for further follow-up  Orders Placed This Encounter  Procedures   CBC with Differential (Cancer Center Only)    Standing Status:   Future    Expiration Date:   03/27/2025   CMP (Cancer Center only)    Standing Status:   Future    Expiration Date:   03/27/2025     Almarie Bedford, MD  INTERVAL HISTORY: he returns for surveillance follow-up for history of lymphoma The patient has chronic nonhealing wound I addressed his medication changes and diabetes He is not symptomatic and with no signs of cancer recurrence  PHYSICAL EXAMINATION: ECOG PERFORMANCE STATUS: 1 - Symptomatic but completely ambulatory  Vitals:   03/27/24 1055  BP: 133/78  Pulse: 65  Resp: 18  Temp: 97.7 F (36.5 C)  SpO2: 94%   Filed Weights   03/27/24 1055  Weight: 258 lb 3.2 oz (117.1 kg)   GENERAL:alert, no distress and comfortable SKIN: skin color, texture, turgor are normal, no rashes or significant lesions EYES: normal, conjunctiva are pink and non-injected, sclera clear OROPHARYNX:no exudate, no erythema and lips, buccal mucosa, and tongue normal  NECK: supple, thyroid  normal size, non-tender, without nodularity LYMPH:  no palpable lymphadenopathy in the cervical, axillary or inguinal LUNGS: clear to  auscultation and percussion with normal breathing effort HEART: regular rate & rhythm and no murmurs and no lower extremity edema ABDOMEN:abdomen soft, non-tender and normal bowel sounds  Relevant data reviewed during this visit included CBC and CMP

## 2024-03-27 NOTE — Assessment & Plan Note (Addendum)
The patient is a long-term cancer survivor Clinically, he is not symptomatic His CBC is completely normal  I do not see a good reason to order repeat staging imaging studies in the absence of symptoms We discussed possible discontinuation of long-term follow-up but the patient would like to return here for few more years for close observation I will see him next year for further follow-up

## 2024-04-01 ENCOUNTER — Telehealth: Payer: Self-pay | Admitting: Podiatry

## 2024-04-01 DIAGNOSIS — Y708 Miscellaneous anesthesiology devices associated with adverse incidents, not elsewhere classified: Secondary | ICD-10-CM | POA: Diagnosis not present

## 2024-04-01 DIAGNOSIS — G620 Drug-induced polyneuropathy: Secondary | ICD-10-CM | POA: Diagnosis not present

## 2024-04-01 DIAGNOSIS — D649 Anemia, unspecified: Secondary | ICD-10-CM | POA: Diagnosis not present

## 2024-04-01 DIAGNOSIS — L97519 Non-pressure chronic ulcer of other part of right foot with unspecified severity: Secondary | ICD-10-CM | POA: Diagnosis not present

## 2024-04-01 DIAGNOSIS — Z9181 History of falling: Secondary | ICD-10-CM | POA: Diagnosis not present

## 2024-04-01 DIAGNOSIS — T451X5A Adverse effect of antineoplastic and immunosuppressive drugs, initial encounter: Secondary | ICD-10-CM | POA: Diagnosis not present

## 2024-04-01 DIAGNOSIS — Z21 Asymptomatic human immunodeficiency virus [HIV] infection status: Secondary | ICD-10-CM | POA: Diagnosis not present

## 2024-04-01 DIAGNOSIS — Z0000 Encounter for general adult medical examination without abnormal findings: Secondary | ICD-10-CM | POA: Diagnosis not present

## 2024-04-01 DIAGNOSIS — E782 Mixed hyperlipidemia: Secondary | ICD-10-CM | POA: Diagnosis not present

## 2024-04-01 DIAGNOSIS — F331 Major depressive disorder, recurrent, moderate: Secondary | ICD-10-CM | POA: Diagnosis not present

## 2024-04-01 DIAGNOSIS — I10 Essential (primary) hypertension: Secondary | ICD-10-CM | POA: Diagnosis not present

## 2024-04-01 DIAGNOSIS — E1169 Type 2 diabetes mellitus with other specified complication: Secondary | ICD-10-CM | POA: Diagnosis not present

## 2024-04-01 NOTE — Telephone Encounter (Signed)
 Received vm from male stating she was calling for pt to get scheduled for toe surgery.   I called pt and left message for pt to call back to get scheduled.

## 2024-04-02 ENCOUNTER — Telehealth: Payer: Self-pay | Admitting: Podiatry

## 2024-04-02 NOTE — Telephone Encounter (Signed)
 DOS- 04/16/2024  CHEILECTOMY 1ST RT- 71710 HAMMERTOE REPAIR 1ST RT- 71714 1ST SURGICAL REMOVAL (DEBRIDEMENT) OF SUBCUTANEOUS TISSUE RT- 11042  [employer] EFFECTIVE DATE- 08/01/2023  DEDUCTIBLE- N/A OOP- $3500 ACCUMULATED- $157.12 COINSURANCE- $175 COPAY REF# [id number]  PER CHRISTOPHER WITH [employer], NO PRIOR AUTHS ARE REQUIRED FOR CPT CODES 71710, 71714, AND 11042. REF# CHRISTOPHER T 04/02/2024 1:30 PM EST

## 2024-04-16 ENCOUNTER — Other Ambulatory Visit: Payer: Self-pay | Admitting: Podiatry

## 2024-04-16 DIAGNOSIS — L97512 Non-pressure chronic ulcer of other part of right foot with fat layer exposed: Secondary | ICD-10-CM

## 2024-04-16 DIAGNOSIS — E11621 Type 2 diabetes mellitus with foot ulcer: Secondary | ICD-10-CM | POA: Diagnosis not present

## 2024-04-16 DIAGNOSIS — M2061 Acquired deformities of toe(s), unspecified, right foot: Secondary | ICD-10-CM

## 2024-04-16 DIAGNOSIS — M205X1 Other deformities of toe(s) (acquired), right foot: Secondary | ICD-10-CM

## 2024-04-16 DIAGNOSIS — M2021 Hallux rigidus, right foot: Secondary | ICD-10-CM | POA: Diagnosis not present

## 2024-04-16 DIAGNOSIS — M2041 Other hammer toe(s) (acquired), right foot: Secondary | ICD-10-CM | POA: Diagnosis not present

## 2024-04-16 MED ORDER — OXYCODONE HCL 5 MG PO TABS: 5.0000 mg | ORAL_TABLET | Freq: Four times a day (QID) | ORAL | 0 refills | Status: AC | PRN

## 2024-04-16 MED ORDER — DOXYCYCLINE HYCLATE 100 MG PO TABS: 100.0000 mg | ORAL_TABLET | Freq: Two times a day (BID) | ORAL | 0 refills | Status: DC

## 2024-04-16 MED ORDER — IBUPROFEN 600 MG PO TABS: 600.0000 mg | ORAL_TABLET | Freq: Three times a day (TID) | ORAL | 0 refills | Status: AC | PRN

## 2024-04-16 MED ORDER — ACETAMINOPHEN 325 MG PO TABS: 650.0000 mg | ORAL_TABLET | Freq: Four times a day (QID) | ORAL | 0 refills | Status: AC | PRN

## 2024-04-16 NOTE — Progress Notes (Signed)
Post op medications sent in.

## 2024-04-21 ENCOUNTER — Ambulatory Visit (INDEPENDENT_AMBULATORY_CARE_PROVIDER_SITE_OTHER): Payer: Self-pay | Admitting: Podiatry

## 2024-04-21 DIAGNOSIS — M2031 Hallux varus (acquired), right foot: Secondary | ICD-10-CM

## 2024-04-21 DIAGNOSIS — M205X1 Other deformities of toe(s) (acquired), right foot: Secondary | ICD-10-CM

## 2024-04-21 DIAGNOSIS — L97512 Non-pressure chronic ulcer of other part of right foot with fat layer exposed: Secondary | ICD-10-CM

## 2024-04-21 MED ORDER — DOXYCYCLINE HYCLATE 100 MG PO TABS: 100.0000 mg | ORAL_TABLET | Freq: Two times a day (BID) | ORAL | 0 refills | Status: AC

## 2024-04-21 NOTE — Progress Notes (Unsigned)
 Subjective:  Patient ID: Jon Shannon, male    DOB: [birth year],  MRN: [id number]  Chief Complaint  Patient presents with   Post-op Follow-up    DOS 04/16/2024 RT 1st toe IPJ. Patient reports odor coming from foot so he did change the dressing, used silvadene  cream on top of the surgical site. There is some slight gapping,some drainage present,  patient is wearing surgical shoe. Pain rating 7/10 today. Taking ibuprofen  and oxycodone  for pain control.     DOS: 04/16/2024 Procedure: Right first toe interphalangeal joint arthroplasty, right first metatarsal head cheilectomy  58 y.o. male returns for post-op check.  He does report that he has been changing the bandage daily, had been instructed to leave in place.  Reporting a little bit of redness about the right first toe and some swelling within postoperative limits.  Does have some soreness about the foot, controlled with pain medication  Review of Systems: Negative except as noted in the HPI. Denies N/V/F/Ch.  Past Medical History:  Diagnosis Date   Anxiety    Depression    Diabetes (HCC)    type II from chemotherapy   DJD (degenerative joint disease)    Elevated serum creatinine 06/15/2014   Fracture    every metatarsal in R foot, and 2nd metatarsal in L foot while walking   Hepatitis A    resolved.    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 , Disp: , Rfl:    bisoprolol-hydrochlorothiazide (ZIAC) 5-6.25 MG tablet, Take 1 tablet by mouth daily., Disp: , Rfl:    Chromium Picolinate (CHROMIUM PICOLATE PO), Take 2 tablets by mouth 2 (two) times daily. , Disp: , Rfl:    CINNAMON PO, Take 2 capsules by mouth 2 (two) times daily. , Disp: , Rfl:    diclofenac (VOLTAREN) 75 MG EC tablet, Take 75 mg by mouth 2 (two) times daily., Disp: , Rfl:    escitalopram (LEXAPRO) 20 MG tablet, Take 20 mg by mouth daily. , Disp: , Rfl: 0   fenofibrate 160 MG tablet, Take 160 mg by mouth at bedtime., Disp: , Rfl:    fish oil-omega-3 fatty acids 1000 MG capsule, Take 2 g by mouth 2 (two) times daily. Two capsules BID, Disp: , Rfl:    Galcanezumab -gnlm (EMGALITY ) 120 MG/ML SOAJ, Inject 120 mg into the skin every 30 (thirty) days., Disp: 1 mL, Rfl: 11   GARLIC OIL PO, Take 2 tablets by mouth daily. , Disp: , Rfl:    ibuprofen  (ADVIL ) 600 MG tablet, Take 1 tablet (600 mg total) by mouth every 8 (eight) hours as needed., Disp:  30 tablet, Rfl: 0   INVOKAMET [phone] MG TABS, Take 1 tablet by mouth daily., Disp: , Rfl:    linezolid  (ZYVOX ) 600 MG tablet, Take 1 tablet (600 mg total) by mouth 2 (two) times daily., Disp: 20 tablet, Rfl: 1   lisinopril (PRINIVIL,ZESTRIL) 5 MG tablet, Take 5 mg by mouth daily., Disp: , Rfl:    loratadine (CLARITIN) 10 MG tablet, Take 20 mg by mouth daily., Disp: , Rfl:    MELATONIN PO, Take by mouth., Disp: , Rfl:    metoprolol succinate (TOPROL-XL) 50 MG 24 hr tablet, Take by mouth daily. , Disp: , Rfl: 0   oxyCODONE  (OXY IR/ROXICODONE ) 5 MG immediate release tablet, Take 1 tablet (5 mg total) by mouth every 6 (six) hours as needed for up to 20 doses for severe pain (pain score 7-10)., Disp: 20 tablet, Rfl: 0   OZEMPIC, 1 MG/DOSE, 4  MG/3ML SOPN, Inject 1 mg into the skin once a week., Disp: , Rfl:    pravastatin (PRAVACHOL) 40 MG tablet, Take 40 mg by mouth daily., Disp: , Rfl:    silver  sulfADIAZINE  (SILVADENE ) 1 % cream, Apply 1 Application topically daily., Disp: 50 g, Rfl: 0   tamsulosin (FLOMAX) 0.4 MG CAPS capsule, Take 0.4 mg by mouth., Disp: , Rfl:    traZODone (DESYREL) 50 MG tablet, TAKE 1-2 TABLETS AT BEDTIME AS NEEDED FOR SLEEP, Disp: , Rfl: 5   TRIUMEQ  600-50-300 MG tablet, TAKE 1 TABLET BY MOUTH DAILY WITH BREAKFAST, Disp: 90 tablet, Rfl: 3   UNABLE TO FIND, at bedtime. Med Name: Blood Sugar Support, Disp: , Rfl:    vitamin C (ASCORBIC ACID) 500 MG tablet, Take 1,000 mg by mouth 2 (two) times daily. , Disp: , Rfl:    VITAMIN D PO, Take 2 tablets by mouth 2 (two) times daily., Disp: , Rfl:    XIGDUO XR 11-998 MG TB24, Take 2 tablets by mouth every morning., Disp: , Rfl:    doxycycline  (VIBRA -TABS) 100 MG tablet, Take 1 tablet (100 mg total) by mouth 2 (two) times daily for 10 days., Disp: 20 tablet, Rfl: 0  Social History   Tobacco Use  Smoking Status Former   Current packs/day: 0.00   Types: Cigarettes   Quit date: 07/01/2011   Years since quitting: 12.8  Smokeless Tobacco Former   Quit date: 2012    Allergies  Allergen Reactions   Shrimp [Shellfish Allergy] Anaphylaxis    Lobster, too   Cefdinir Other (See Comments)    Oral ulcer, mild.    Objective:  There were no vitals filed for this visit. There is no height or weight on file to calculate BMI. Constitutional Well developed. Well nourished.  Vascular Foot warm and well perfused.  DP and PT pulses palpable 2/4. Capillary refill normal to all digits.  No calf swelling, no pain with calf squeeze.  Neurologic Normal speech. Oriented to person, place, and time. Light touch sensation decreased to the toes.  Protective sensation decreased.  Vibratory sensation decreased.  Dermatologic Sutures are intact to the dorsal first MPJ and first toe  incision.  Minimal gapping noted at the apices of the first toe incision.  Skin edges otherwise well-approximated well coapted.  Mild erythema about the first toe dorsally.  Edema within postoperative limits.  Left first toe ulceration with minimal overlying hemorrhagic callus approximately 0.5 x 0.5 x 0.2 cm.  Orthopedic: Tenderness to palpation noted about the surgical site.    Assessment:   1. Ulcer of great  toe, right, with fat layer exposed (HCC)   2. Hallux limitus of right foot   3. Hallux malleus of right foot    Plan:  Patient was evaluated and treated and all questions answered.  S/p foot surgery left first toe interphalangeal joint arthroplasty, first metatarsal head cheilectomy, plantar medial interphalangeal joint ulceration debridement -Progressing as expected post-operatively. -XR: Plan to obtain at next visit -WB Status: Weightbearing as tolerated in surgical shoe -Sutures: Left intact today.  Likely will remain in place for about 2 more weeks -Medications: Due to some redness, extending doxycycline  out of abundance of caution.  100 mg twice daily for 10 days - The right first toe plantar medial interphalangeal joint ulceration did not require significant excisional debridement today.  Did freshen up wound edges and remove some surrounding hyperkeratotic tissue using a #15 blade without incident.  Hemostasis achieved with compression.  Aquacel Ag applied to the site.  The surgical site was dressed with Xeroform, 4 x 4 gauze, Kerlix, Ace wrap - Leave in place until follow-up visit, keep dressing clean and dry  Return in about 1 week (around 04/28/2024) for Post op XR.

## 2024-04-23 ENCOUNTER — Encounter: Payer: Self-pay | Admitting: Podiatry

## 2024-04-28 ENCOUNTER — Ambulatory Visit (INDEPENDENT_AMBULATORY_CARE_PROVIDER_SITE_OTHER): Admitting: Podiatry

## 2024-04-28 ENCOUNTER — Ambulatory Visit

## 2024-04-28 ENCOUNTER — Encounter: Payer: Self-pay | Admitting: Podiatry

## 2024-04-28 DIAGNOSIS — L97519 Non-pressure chronic ulcer of other part of right foot with unspecified severity: Secondary | ICD-10-CM

## 2024-04-28 DIAGNOSIS — M79671 Pain in right foot: Secondary | ICD-10-CM

## 2024-04-28 DIAGNOSIS — Z9889 Other specified postprocedural states: Secondary | ICD-10-CM

## 2024-04-28 DIAGNOSIS — L03031 Cellulitis of right toe: Secondary | ICD-10-CM

## 2024-04-28 DIAGNOSIS — E11621 Type 2 diabetes mellitus with foot ulcer: Secondary | ICD-10-CM

## 2024-04-28 DIAGNOSIS — L02611 Cutaneous abscess of right foot: Secondary | ICD-10-CM

## 2024-04-28 MED ORDER — LINEZOLID 600 MG PO TABS: 600.0000 mg | ORAL_TABLET | Freq: Two times a day (BID) | ORAL | 0 refills | Status: DC

## 2024-04-28 NOTE — Progress Notes (Unsigned)
 Subjective:  Patient ID: Jon Shannon, male    DOB: 09/30/1965,  MRN: [id number]  Chief Complaint  Patient presents with   Routine Post Op    DOS 04/16/24 Xrays Taken, reports lots of drainage. Only taken 4 of the pain meds but is taking 600 mg Ibuprofen .  Looks like there may be one or 2 stitches that have been popped. Drainage present, and toe is red and swollen. Xrays up.  A1c was 5.7 in July.  ASA    DOS: 04/16/2024 Procedure: Right first toe interphalangeal joint arthroplasty, right first metatarsal head cheilectomy  58 y.o. male returns for post-op check.  Today he reports he has been leaving the dressing in place, initially he was changing it daily.  Reports intermittent episodes of pain to the area.  Review of Systems: Negative except as noted in the HPI. Denies N/V/F/Ch.  Past Medical History:  Diagnosis Date   Anxiety    Depression    Diabetes (HCC)    type II from chemotherapy   DJD (degenerative joint disease)    Elevated serum creatinine 06/15/2014   Fracture    every metatarsal in R foot, and 2nd metatarsal in L foot while walking   Hepatitis A    resolved.    HIV INFECTION 11/30/2009   no history of opportunistic infection   Hodgkin's lymphoma (HCC) 11/09/2011   Hx of radiation therapy 04/03/12 -04/29/12   Hodgkin's disease -neck   Hyperlipidemia 08/07/2011   HYPERTENSION 11/30/2009   Hypertriglyceridemia    Memory difficulty    chemo brain long and short term memory come and go   Neuropathy    from chemo and diabetes   PTSD (post-traumatic stress disorder)    Rectal bleeding 09/04/2013   Weight loss 05/29/2013    Current Outpatient Medications:    acetaminophen  (TYLENOL ) 325 MG tablet, Take 2 tablets (650 mg total) by mouth every 6 (six) hours as needed for up to 14 days for mild pain (pain score 1-3)., Disp: 114 tablet, Rfl: 0   aspirin 81 MG tablet, Take 81 mg by mouth daily., Disp: , Rfl:    B Complex-C (B-COMPLEX WITH VITAMIN C) tablet, Take 2  tablets by mouth daily. , Disp: , Rfl:    bisoprolol-hydrochlorothiazide (ZIAC) 5-6.25 MG tablet, Take 1 tablet by mouth daily., Disp: , Rfl:    Chromium Picolinate (CHROMIUM PICOLATE PO), Take 2 tablets by mouth 2 (two) times daily. , Disp: , Rfl:    CINNAMON PO, Take 2 capsules by mouth 2 (two) times daily. , Disp: , Rfl:    diclofenac (VOLTAREN) 75 MG EC tablet, Take 75 mg by mouth 2 (two) times daily., Disp: , Rfl:    doxycycline  (VIBRA -TABS) 100 MG tablet, Take 1 tablet (100 mg total) by mouth 2 (two) times daily for 10 days., Disp: 20 tablet, Rfl: 0   escitalopram (LEXAPRO) 20 MG tablet, Take 20 mg by mouth daily. , Disp: , Rfl: 0   fenofibrate 160 MG tablet, Take 160 mg by mouth at bedtime., Disp: , Rfl:    fish oil-omega-3 fatty acids 1000 MG capsule, Take 2 g by mouth 2 (two) times daily. Two capsules BID, Disp: , Rfl:    Galcanezumab -gnlm (EMGALITY ) 120 MG/ML SOAJ, Inject 120 mg into the skin every 30 (thirty) days., Disp: 1 mL, Rfl: 11   GARLIC OIL PO, Take 2 tablets by mouth daily. , Disp: , Rfl:    ibuprofen  (ADVIL ) 600 MG tablet, Take 1 tablet (600 mg total) by mouth  every 8 (eight) hours as needed., Disp: 30 tablet, Rfl: 0   INVOKAMET [phone] MG TABS, Take 1 tablet by mouth daily., Disp: , Rfl:    lisinopril (PRINIVIL,ZESTRIL) 5 MG tablet, Take 5 mg by mouth daily., Disp: , Rfl:    loratadine (CLARITIN) 10 MG tablet, Take 20 mg by mouth daily., Disp: , Rfl:    MELATONIN PO, Take by mouth., Disp: , Rfl:    metoprolol succinate (TOPROL-XL) 50 MG 24 hr tablet, Take by mouth daily. , Disp: , Rfl: 0   oxyCODONE  (OXY IR/ROXICODONE ) 5 MG immediate release tablet, Take 1 tablet (5 mg total) by mouth every 6 (six) hours as needed for up to 20 doses for severe pain (pain score 7-10)., Disp: 20 tablet, Rfl: 0   OZEMPIC, 1 MG/DOSE, 4 MG/3ML SOPN, Inject 1 mg into the skin once a week., Disp: , Rfl:    pravastatin (PRAVACHOL) 40 MG tablet, Take 40 mg by mouth daily., Disp: , Rfl:    silver   sulfADIAZINE  (SILVADENE ) 1 % cream, Apply 1 Application topically daily., Disp: 50 g, Rfl: 0   tamsulosin (FLOMAX) 0.4 MG CAPS capsule, Take 0.4 mg by mouth., Disp: , Rfl:    traZODone (DESYREL) 50 MG tablet, TAKE 1-2 TABLETS AT BEDTIME AS NEEDED FOR SLEEP, Disp: , Rfl: 5   TRIUMEQ  600-50-300 MG tablet, TAKE 1 TABLET BY MOUTH DAILY WITH BREAKFAST, Disp: 90 tablet, Rfl: 3   UNABLE TO FIND, at bedtime. Med Name: Blood Sugar Support, Disp: , Rfl:    vitamin C (ASCORBIC ACID) 500 MG tablet, Take 1,000 mg by mouth 2 (two) times daily. , Disp: , Rfl:    VITAMIN D PO, Take 2 tablets by mouth 2 (two) times daily., Disp: , Rfl:    XIGDUO XR 11-998 MG TB24, Take 2 tablets by mouth every morning., Disp: , Rfl:    linezolid  (ZYVOX ) 600 MG tablet, Take 1 tablet (600 mg total) by mouth 2 (two) times daily for 10 days., Disp: 20 tablet, Rfl: 0  Social History   Tobacco Use  Smoking Status Former   Current packs/day: 0.00   Types: Cigarettes   Quit date: 07/01/2011   Years since quitting: 12.8  Smokeless Tobacco Former   Quit date: 2012    Allergies  Allergen Reactions   Shrimp [Shellfish Allergy] Anaphylaxis    Lobster, too   Cefdinir Other (See Comments)    Oral ulcer, mild.    Objective:  There were no vitals filed for this visit. There is no height or weight on file to calculate BMI. Constitutional Well developed. Well nourished.  Vascular Foot warm and well perfused.  DP and PT pulses palpable 2/4. Capillary refill normal to all digits.  No calf swelling, no pain with calf squeeze.  Neurologic Normal speech. Oriented to person, place, and time. Light touch sensation decreased to the toes.  Protective sensation decreased.  Vibratory sensation decreased.  Dermatologic Surgical incision site noted with sutures intact.  First MPJ portion appears to be healing well.  Dorsal first toe portion there is some erythema, some evidence of gapping, some fibrous slough about the incision  site.  Plantar medial interphalangeal joint ulceration left first toe scabbed over at this point.  Orthopedic: Tenderness to palpation noted about the surgical site.   Radiographs: Right foot 3 views 04/28/2024 Surgical changes seen status post first toe interphalange joint arthroplasty.  No soft tissue emphysema.  No evidence of osteolysis or cortical erosion.  Evidence of chronic deformity as noted  previously.  First metatarsal head status post cheilectomy present Assessment:   1. Post-operative state   2. Cellulitis and abscess of toe of right foot   3. Diabetic ulcer of toe of right foot associated with type 2 diabetes mellitus, unspecified ulcer stage (HCC)    Plan:  Patient was evaluated and treated and all questions answered.  S/p foot surgery left first toe interphalangeal joint arthroplasty, first metatarsal head cheilectomy, plantar medial interphalangeal joint ulceration debridement - Do have some concern for surgical site infection today.  Prescribing course of oral linezolid  as he has had responded to this previously and has had recurrent bouts of cellulitis to the operative limb -XR: Reviewed with patient, likely will obtain follow-up at next visit -WB Status: Weightbearing as tolerated in surgical shoe -Sutures: Left intact today.  Will evaluate for possible removal next week -Medications: Does have pain medication available, he has taken very little of it due to concern for dependence.  Continue ice and elevation. - The right first toe plantar medial interphalangeal joint ulceration did not require significant excisional debridement today.  Did lightly debride some overlying scab.  Wound does appear more superficial than previous - Dressed with Betadine Adaptic to the incision site and to the ulceration site, 4 x 4 gauze, Kling, Ace wrap. - Given the redness and some drainage from the incision, patient can change the dressing daily to every other day.  He reports having  adequate supplies at home.  Reviewed dressing instructions.  Keep foot clean and dry. - Monitor for signs of worsening infection.  Contact office immediately if this develops.  Return in about 1 week (around 05/05/2024) for Post Op Suture Removal.

## 2024-04-28 NOTE — Patient Instructions (Signed)
 Instructions for Wound Care  The most important step to healing a foot wound is to reduce the pressure on your foot - it is extremely important to stay off your foot as much as possible and wear the shoe/boot as instructed.  Change your dressing daily or every other day.  Gently swab incision and ulcer site with Betadine solution and cover with nonadherent gauze or Vaseline gauze.  Can apply gauze, gauze wrap and Ace wrap to secure in place.  Continue to keep dry in shower.  Monitor for any signs/symptoms of infection. If there is any increase in redness, red streaks, increase in drainage, warmth to your foot please give us  a call. Also, if you start to run a fever or have flu-like symptoms that can also be a sign of infection. Call the office immediately if any occur or go directly to the emergency room.   If you have any questions, please feel free to give us  a call at [phone] or if you are on MyChart you can always send me a message if needed.

## 2024-04-30 ENCOUNTER — Encounter: Payer: Self-pay | Admitting: Infectious Diseases

## 2024-05-01 ENCOUNTER — Other Ambulatory Visit (HOSPITAL_COMMUNITY): Payer: Self-pay

## 2024-05-01 ENCOUNTER — Telehealth: Payer: Self-pay

## 2024-05-01 NOTE — Telephone Encounter (Signed)
 Pharmacy Patient Advocate Encounter   Received notification from Fax that prior authorization for Emgality  120mg /ml autoinjector is required/requested.   Insurance verification completed.   The patient is insured through [facility] [employer].   Per test claim: PA required; PA submitted to above mentioned insurance via Latent Key/confirmation #/EOC [id number] Status is pending

## 2024-05-05 ENCOUNTER — Ambulatory Visit: Admitting: Podiatry

## 2024-05-05 ENCOUNTER — Other Ambulatory Visit (HOSPITAL_COMMUNITY): Payer: Self-pay

## 2024-05-05 ENCOUNTER — Encounter: Payer: Self-pay | Admitting: Podiatry

## 2024-05-05 DIAGNOSIS — L02611 Cutaneous abscess of right foot: Secondary | ICD-10-CM

## 2024-05-05 DIAGNOSIS — E11621 Type 2 diabetes mellitus with foot ulcer: Secondary | ICD-10-CM

## 2024-05-05 DIAGNOSIS — M205X1 Other deformities of toe(s) (acquired), right foot: Secondary | ICD-10-CM

## 2024-05-05 DIAGNOSIS — L97519 Non-pressure chronic ulcer of other part of right foot with unspecified severity: Secondary | ICD-10-CM

## 2024-05-05 DIAGNOSIS — L03031 Cellulitis of right toe: Secondary | ICD-10-CM

## 2024-05-05 MED ORDER — LINEZOLID 600 MG PO TABS: 600.0000 mg | ORAL_TABLET | Freq: Two times a day (BID) | ORAL | 0 refills | Status: AC

## 2024-05-05 NOTE — Telephone Encounter (Signed)
 Pharmacy Patient Advocate Encounter  Received notification from [employer] that Prior Authorization for Emgality  has been APPROVED from 05/01/24 to 05/01/25 Pt filled 05/02/24   PA #/Case ID/Reference #: [id number]

## 2024-05-05 NOTE — Progress Notes (Unsigned)
 Subjective:  Patient ID: Jon Shannon, male    DOB: 07/15/1966,  MRN: [id number]  Chief Complaint  Patient presents with   Routine Post Op    DOS 04/16/24 Right hallux, PO/ulcer.  Looks slightly better then last time. Covered with betadine, cannot tell if is draining.     DOS: 04/16/2024 Procedure: Right first toe interphalangeal joint arthroplasty, right first metatarsal head cheilectomy  58 y.o. male returns for post-op check.  He feels that redness has improved somewhat and he is doing better from a pain standpoint. Has been dealing with surgical site infection and some delayed healing possibly from washing surgical site during immediate post op period. Has been on zyvox  and thinks it is helping.  Review of Systems: Negative except as noted in the HPI. Denies N/V/F/Ch.  Past Medical History:  Diagnosis Date   Anxiety    Depression    Diabetes (HCC)    type II from chemotherapy   DJD (degenerative joint disease)    Elevated serum creatinine 06/15/2014   Fracture    every metatarsal in R foot, and 2nd metatarsal in L foot while walking   Hepatitis A    resolved.    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 , Disp: , Rfl:    bisoprolol-hydrochlorothiazide (ZIAC) 5-6.25 MG tablet, Take 1 tablet by mouth daily., Disp: , Rfl:    Chromium Picolinate (CHROMIUM PICOLATE PO), Take 2  tablets by mouth 2 (two) times daily. , Disp: , Rfl:    CINNAMON PO, Take 2 capsules by mouth 2 (two) times daily. , Disp: , Rfl:    diclofenac (VOLTAREN) 75 MG EC tablet, Take 75 mg by mouth 2 (two) times daily., Disp: , Rfl:    escitalopram (LEXAPRO) 20 MG tablet, Take 20 mg by mouth daily. , Disp: , Rfl: 0   fenofibrate 160 MG tablet, Take 160 mg by mouth at bedtime., Disp: , Rfl:    fish oil-omega-3 fatty acids 1000 MG capsule, Take 2 g by mouth 2 (two) times daily. Two capsules BID, Disp: , Rfl:    Galcanezumab -gnlm (EMGALITY ) 120 MG/ML SOAJ, Inject 120 mg into the skin every 30 (thirty) days., Disp: 1 mL, Rfl: 11   GARLIC OIL PO, Take 2 tablets by mouth daily. , Disp: , Rfl:    ibuprofen  (ADVIL ) 600 MG tablet, Take 1 tablet (600 mg total) by mouth every 8 (eight) hours as needed., Disp: 30 tablet, Rfl: 0   INVOKAMET [phone] MG TABS, Take 1 tablet by mouth daily., Disp: , Rfl:    lisinopril (PRINIVIL,ZESTRIL) 5 MG tablet, Take 5 mg by mouth daily., Disp: , Rfl:    loratadine (CLARITIN) 10 MG tablet, Take 20 mg by mouth daily., Disp: , Rfl:    MELATONIN PO, Take by mouth., Disp: , Rfl:    metoprolol succinate (  TOPROL-XL) 50 MG 24 hr tablet, Take by mouth daily. , Disp: , Rfl: 0   oxyCODONE  (OXY IR/ROXICODONE ) 5 MG immediate release tablet, Take 1 tablet (5 mg total) by mouth every 6 (six) hours as needed for up to 20 doses for severe pain (pain score 7-10)., Disp: 20 tablet, Rfl: 0   OZEMPIC, 1 MG/DOSE, 4 MG/3ML SOPN, Inject 1 mg into the skin once a week., Disp: , Rfl:    pravastatin (PRAVACHOL) 40 MG tablet, Take 40 mg by mouth daily., Disp: , Rfl:    silver  sulfADIAZINE  (SILVADENE ) 1 % cream, Apply 1 Application topically daily., Disp: 50 g, Rfl: 0   tamsulosin (FLOMAX) 0.4 MG CAPS capsule, Take 0.4 mg by mouth., Disp: , Rfl:    traZODone (DESYREL) 50 MG tablet, TAKE 1-2 TABLETS AT BEDTIME AS NEEDED FOR SLEEP, Disp: , Rfl: 5   TRIUMEQ  600-50-300 MG tablet, TAKE 1 TABLET BY MOUTH DAILY  WITH BREAKFAST, Disp: 90 tablet, Rfl: 3   UNABLE TO FIND, at bedtime. Med Name: Blood Sugar Support, Disp: , Rfl:    vitamin C (ASCORBIC ACID) 500 MG tablet, Take 1,000 mg by mouth 2 (two) times daily. , Disp: , Rfl:    VITAMIN D PO, Take 2 tablets by mouth 2 (two) times daily., Disp: , Rfl:    XIGDUO XR 11-998 MG TB24, Take 2 tablets by mouth every morning., Disp: , Rfl:    [START ON 05/08/2024] linezolid  (ZYVOX ) 600 MG tablet, Take 1 tablet (600 mg total) by mouth 2 (two) times daily for 5 days., Disp: 10 tablet, Rfl: 0  Social History   Tobacco Use  Smoking Status Former   Current packs/day: 0.00   Types: Cigarettes   Quit date: 07/01/2011   Years since quitting: 12.8  Smokeless Tobacco Former   Quit date: 2012    Allergies  Allergen Reactions   Shrimp [Shellfish Allergy] Anaphylaxis    Lobster, too   Cefdinir Other (See Comments)    Oral ulcer, mild.    Objective:  There were no vitals filed for this visit. There is no height or weight on file to calculate BMI. Constitutional Well developed. Well nourished.  Vascular Foot warm and well perfused.  DP and PT pulses palpable 2/4. Capillary refill normal to all digits.  No calf swelling, no pain with calf squeeze. Mild redness dorsal 1st toe right foot.  Neurologic Normal speech. Oriented to person, place, and time. Light touch sensation decreased to the toes.  Protective sensation decreased.  Vibratory sensation decreased.  Dermatologic Right first toe and MPJ surgical site noted.  MPJ portion of the incision appears well-healed.  Transverse portion of the incision over the interphalangeal joint there is some gapping noted with fibrogranular subcutaneous tissue exposed.  Minimal serous drainage.    Plantar medial interphalangeal joint ulceration left first toe scabbed over at this point.  Orthopedic: Tenderness to palpation noted about the surgical site.   Radiographs: Right foot 3 views 04/28/2024 Surgical changes seen status  post first toe interphalange joint arthroplasty.  No soft tissue emphysema.  No evidence of osteolysis or cortical erosion.  Evidence of chronic deformity as noted previously.  First metatarsal head status post cheilectomy present Assessment:   1. Cellulitis and abscess of toe of right foot   2. Diabetic ulcer of toe of right foot associated with type 2 diabetes mellitus, unspecified ulcer stage (HCC)   3. Hallux limitus of right foot    Plan:  Patient was evaluated and treated and  all questions answered.  S/p foot surgery left first toe interphalangeal joint arthroplasty, first metatarsal head cheilectomy, plantar medial interphalangeal joint ulceration debridement - Signs of infection seem to be improving with the linezolid .  He does have 2 more days of his course, will extend it out until he follows up in 1 week, continue Zyvox  600 mg twice daily through 10/14. -WB Status: Weightbearing as tolerated in surgical shoe -Sutures: Majority of sutures were left intact today -Medications: Does have pain medication available, he has taken very little of it due to concern for dependence.  Continue ice and elevation. - The right first toe plantar medial interphalangeal joint ulceration did not require debridement today.  Continue to monitor. - Gapping portion of the incision was cleansed with wound cleanser.  Dabbed with Betadine.  Did lightly debride some overlying slough from this area with cleanser and gauze and #15 blade.  Aquacel Ag was applied over the interphalangeal joint portion of the incision or gapping present and 4 x 4 gauze, kerlix and ace wrap applied - Reviewed dressing changes with patient, change daily. - Monitor for signs of worsening infection.  Contact office immediately if this develops. -Plan to remove remaining sutures at follow up, proceeding with more local woundcare as necessary.  Return in about 1 week (around 05/12/2024) for Post Op Suture Removal.

## 2024-05-07 ENCOUNTER — Encounter: Payer: Self-pay | Admitting: Podiatry

## 2024-05-12 ENCOUNTER — Ambulatory Visit: Admitting: Podiatry

## 2024-05-12 DIAGNOSIS — T8130XA Disruption of wound, unspecified, initial encounter: Secondary | ICD-10-CM

## 2024-05-12 DIAGNOSIS — M2061 Acquired deformities of toe(s), unspecified, right foot: Secondary | ICD-10-CM

## 2024-05-12 DIAGNOSIS — Z9889 Other specified postprocedural states: Secondary | ICD-10-CM

## 2024-05-12 NOTE — Progress Notes (Unsigned)
 Subjective:  Patient ID: Jon Shannon, male    DOB: 1965/09/05,  MRN: [id number]  Chief Complaint  Patient presents with   Routine Post Op    DOS 04/16/24 Suture removal. PO, can not tell but it lookss like most of the sutures may be busted. From incision line, the toe is white, looks macerated.      DOS: 04/16/2024 Procedure: Right first toe interphalangeal joint arthroplasty, right first metatarsal head cheilectomy  58 y.o. male returns for post-op check.  He has been keeping up with wound care using the Aquacel Ag to the dorsal incision dehiscence site.  He does feel it is doing a bit better.  Does report some intermittent episodes of pain.  Believes that the ulcer site on the right first toe is doing pretty well.  Review of Systems: Negative except as noted in the HPI. Denies N/V/F/Ch.  Past Medical History:  Diagnosis Date   Anxiety    Depression    Diabetes (HCC)    type II from chemotherapy   DJD (degenerative joint disease)    Elevated serum creatinine 06/15/2014   Fracture    every metatarsal in R foot, and 2nd metatarsal in L foot while walking   Hepatitis A    resolved.    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 , Disp: , Rfl:    bisoprolol-hydrochlorothiazide (ZIAC) 5-6.25 MG tablet, Take 1 tablet by mouth daily., Disp: , Rfl:    Chromium Picolinate (CHROMIUM  PICOLATE PO), Take 2 tablets by mouth 2 (two) times daily. , Disp: , Rfl:    CINNAMON PO, Take 2 capsules by mouth 2 (two) times daily. , Disp: , Rfl:    diclofenac (VOLTAREN) 75 MG EC tablet, Take 75 mg by mouth 2 (two) times daily., Disp: , Rfl:    escitalopram (LEXAPRO) 20 MG tablet, Take 20 mg by mouth daily. , Disp: , Rfl: 0   fenofibrate 160 MG tablet, Take 160 mg by mouth at bedtime., Disp: , Rfl:    fish oil-omega-3 fatty acids 1000 MG capsule, Take 2 g by mouth 2 (two) times daily. Two capsules BID, Disp: , Rfl:    Galcanezumab -gnlm (EMGALITY ) 120 MG/ML SOAJ, Inject 120 mg into the skin every 30 (thirty) days., Disp: 1 mL, Rfl: 11   GARLIC OIL PO, Take 2 tablets by mouth daily. , Disp: , Rfl:    ibuprofen  (ADVIL ) 600 MG tablet, Take 1 tablet (600 mg total) by mouth every 8 (eight) hours as needed., Disp: 30 tablet, Rfl: 0   INVOKAMET [phone] MG TABS, Take 1 tablet by mouth daily., Disp: , Rfl:    lisinopril (PRINIVIL,ZESTRIL) 5 MG tablet, Take 5 mg by mouth daily., Disp: , Rfl:    loratadine (CLARITIN) 10 MG tablet, Take 20 mg by mouth daily., Disp: , Rfl:  MELATONIN PO, Take by mouth., Disp: , Rfl:    metoprolol succinate (TOPROL-XL) 50 MG 24 hr tablet, Take by mouth daily. , Disp: , Rfl: 0   oxyCODONE  (OXY IR/ROXICODONE ) 5 MG immediate release tablet, Take 1 tablet (5 mg total) by mouth every 6 (six) hours as needed for up to 20 doses for severe pain (pain score 7-10)., Disp: 20 tablet, Rfl: 0   OZEMPIC, 1 MG/DOSE, 4 MG/3ML SOPN, Inject 1 mg into the skin once a week., Disp: , Rfl:    pravastatin (PRAVACHOL) 40 MG tablet, Take 40 mg by mouth daily., Disp: , Rfl:    silver  sulfADIAZINE  (SILVADENE ) 1 % cream, Apply 1 Application topically daily., Disp: 50 g, Rfl: 0   tamsulosin (FLOMAX) 0.4 MG CAPS capsule, Take 0.4 mg by mouth., Disp: , Rfl:    traZODone (DESYREL) 50 MG tablet, TAKE 1-2 TABLETS AT BEDTIME AS NEEDED FOR SLEEP, Disp: , Rfl: 5   TRIUMEQ  600-50-300 MG tablet, TAKE 1  TABLET BY MOUTH DAILY WITH BREAKFAST, Disp: 90 tablet, Rfl: 3   UNABLE TO FIND, at bedtime. Med Name: Blood Sugar Support, Disp: , Rfl:    vitamin C (ASCORBIC ACID) 500 MG tablet, Take 1,000 mg by mouth 2 (two) times daily. , Disp: , Rfl:    VITAMIN D PO, Take 2 tablets by mouth 2 (two) times daily., Disp: , Rfl:    XIGDUO XR 11-998 MG TB24, Take 2 tablets by mouth every morning., Disp: , Rfl:   Social History   Tobacco Use  Smoking Status Former   Current packs/day: 0.00   Types: Cigarettes   Quit date: 07/01/2011   Years since quitting: 12.8  Smokeless Tobacco Former   Quit date: 2012    Allergies  Allergen Reactions   Shrimp [Shellfish Allergy] Anaphylaxis    Lobster, too   Cefdinir Other (See Comments)    Oral ulcer, mild.    Objective:  There were no vitals filed for this visit. There is no height or weight on file to calculate BMI. Constitutional Well developed. Well nourished.  Vascular Foot warm and well perfused.  DP and PT pulses palpable 2/4. Capillary refill normal to all digits.  No calf swelling, no pain with calf squeeze. Mild redness dorsal 1st toe right foot.  Neurologic Normal speech. Oriented to person, place, and time. Light touch sensation decreased to the toes.  Protective sensation decreased.  Vibratory sensation decreased.  Dermatologic Right first toe and MPJ surgical site noted.  MPJ portion of the incision appears well-healed.  At the transverse portion of the incision at the interphalangeal joint dehiscence noted involving subcutaneous tissue, this is about 3.5 cm in width.  No malodor, mild fibrous slough, was debrided to bleeding base with improved granulation tissue.  Plantar medial interphalangeal joint ulceration first toe ulceration loosely adhered scab, does appear healed underlying this.  Orthopedic: Tenderness to palpation noted about the surgical site.   Radiographs: Right foot 3 views 04/28/2024 Surgical changes seen status post first toe  interphalange joint arthroplasty.  No soft tissue emphysema.  No evidence of osteolysis or cortical erosion.  Evidence of chronic deformity as noted previously.  First metatarsal head status post cheilectomy present Assessment:   1. Acquired deformity of right toe   2. Post-operative state   3. Wound dehiscence    Plan:  Patient was evaluated and treated and all questions answered.  S/p foot surgery left first toe interphalangeal joint arthroplasty, first metatarsal head cheilectomy, plantar medial interphalangeal joint ulceration  debridement - Has completed his Zyvox .  Signs of infection appear resolved.  Monitor for signs of recurrence -WB Status: Weightbearing as tolerated in surgical shoe -Sutures: Removed the majority of stitches today -Medications: Does have pain medication available, he has taken very little of it due to concern for dependence.  Continue ice and elevation. - Right first toe plantar medial or plantar joint appears healed today. - Gapping portion of the incision was cleansed with wound cleanser.  Dabbed with Betadine.  Today did debride fibrotic tissue and slough from this area, it does appear to be progressing well.  Today Betadine dressing was applied.  Will have patient resume daily Betadine wet-to-dry dressing changes. - Reviewed dressing changes with patient, change daily. - Monitor for signs of worsening infection.  Contact office immediately if this develops. - Plan to continue with local wound care.  Obtain x-rays at next visit as a precaution.    Return in about 1 week (around 05/19/2024) for Post op XR.

## 2024-05-14 ENCOUNTER — Encounter: Payer: Self-pay | Admitting: Podiatry

## 2024-05-19 ENCOUNTER — Ambulatory Visit (INDEPENDENT_AMBULATORY_CARE_PROVIDER_SITE_OTHER)

## 2024-05-19 ENCOUNTER — Ambulatory Visit (INDEPENDENT_AMBULATORY_CARE_PROVIDER_SITE_OTHER): Admitting: Podiatry

## 2024-05-19 DIAGNOSIS — T8130XA Disruption of wound, unspecified, initial encounter: Secondary | ICD-10-CM

## 2024-05-19 DIAGNOSIS — M2061 Acquired deformities of toe(s), unspecified, right foot: Secondary | ICD-10-CM

## 2024-05-19 DIAGNOSIS — Z9889 Other specified postprocedural states: Secondary | ICD-10-CM

## 2024-05-19 NOTE — Progress Notes (Unsigned)
 Subjective:  Patient ID: Jon Shannon, male    DOB: 01-Feb-1966,  MRN: [id number]  Chief Complaint  Patient presents with   Routine Post Op    PO DOS 04/16/24 Right foot, hallux, sutures fell off thrusday or Friday. A little more closed then at last apt. Still very swollen and he is having pain bad enough at night that it is waking him up. Pain across the top of the foot. With stinging in the hallux.     DOS: 04/16/2024 Procedure: Right first toe interphalangeal joint arthroplasty, right first metatarsal head cheilectomy  58 y.o. male returns for post-op check.  He has been keeping up with wound care using the betadine wet to dry dressings to the dorsal incision dehiscence site.  He does feel it is doing a bit better.  Does report some intermittent episodes of pain.  Believes that the ulcer site on the right first toe is doing pretty well.  Review of Systems: Negative except as noted in the HPI. Denies N/V/F/Ch.  Past Medical History:  Diagnosis Date   Anxiety    Depression    Diabetes (HCC)    type II from chemotherapy   DJD (degenerative joint disease)    Elevated serum creatinine 06/15/2014   Fracture    every metatarsal in R foot, and 2nd metatarsal in L foot while walking   Hepatitis A    resolved.    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 , Disp: , Rfl:     bisoprolol-hydrochlorothiazide (ZIAC) 5-6.25 MG tablet, Take 1 tablet by mouth daily., Disp: , Rfl:    Chromium Picolinate (CHROMIUM PICOLATE PO), Take 2 tablets by mouth 2 (two) times daily. , Disp: , Rfl:    CINNAMON PO, Take 2 capsules by mouth 2 (two) times daily. , Disp: , Rfl:    diclofenac (VOLTAREN) 75 MG EC tablet, Take 75 mg by mouth 2 (two) times daily., Disp: , Rfl:    escitalopram (LEXAPRO) 20 MG tablet, Take 20 mg by mouth daily. , Disp: , Rfl: 0   fenofibrate 160 MG tablet, Take 160 mg by mouth at bedtime., Disp: , Rfl:    fish oil-omega-3 fatty acids 1000 MG capsule, Take 2 g by mouth 2 (two) times daily. Two capsules BID, Disp: , Rfl:    Galcanezumab -gnlm (EMGALITY ) 120 MG/ML SOAJ, Inject 120 mg into the skin every 30 (thirty) days., Disp: 1 mL, Rfl: 11   GARLIC OIL PO, Take 2 tablets by mouth daily. , Disp: , Rfl:    ibuprofen  (ADVIL ) 600 MG tablet, Take 1 tablet (600 mg total) by mouth every 8 (eight) hours as needed., Disp: 30 tablet, Rfl: 0   INVOKAMET [phone] MG TABS, Take 1 tablet by mouth daily., Disp: , Rfl:    lisinopril (PRINIVIL,ZESTRIL) 5 MG tablet, Take 5 mg by  mouth daily., Disp: , Rfl:    loratadine (CLARITIN) 10 MG tablet, Take 20 mg by mouth daily., Disp: , Rfl:    MELATONIN PO, Take by mouth., Disp: , Rfl:    metoprolol succinate (TOPROL-XL) 50 MG 24 hr tablet, Take by mouth daily. , Disp: , Rfl: 0   oxyCODONE  (OXY IR/ROXICODONE ) 5 MG immediate release tablet, Take 1 tablet (5 mg total) by mouth every 6 (six) hours as needed for up to 20 doses for severe pain (pain score 7-10)., Disp: 20 tablet, Rfl: 0   OZEMPIC, 1 MG/DOSE, 4 MG/3ML SOPN, Inject 1 mg into the skin once a week., Disp: , Rfl:    pravastatin (PRAVACHOL) 40 MG tablet, Take 40 mg by mouth daily., Disp: , Rfl:    silver  sulfADIAZINE  (SILVADENE ) 1 % cream, Apply 1 Application topically daily., Disp: 50 g, Rfl: 0   tamsulosin (FLOMAX) 0.4 MG CAPS capsule, Take 0.4 mg by mouth., Disp: , Rfl:     traZODone (DESYREL) 50 MG tablet, TAKE 1-2 TABLETS AT BEDTIME AS NEEDED FOR SLEEP, Disp: , Rfl: 5   TRIUMEQ  600-50-300 MG tablet, TAKE 1 TABLET BY MOUTH DAILY WITH BREAKFAST, Disp: 90 tablet, Rfl: 3   UNABLE TO FIND, at bedtime. Med Name: Blood Sugar Support, Disp: , Rfl:    vitamin C (ASCORBIC ACID) 500 MG tablet, Take 1,000 mg by mouth 2 (two) times daily. , Disp: , Rfl:    VITAMIN D PO, Take 2 tablets by mouth 2 (two) times daily., Disp: , Rfl:    XIGDUO XR 11-998 MG TB24, Take 2 tablets by mouth every morning., Disp: , Rfl:   Social History   Tobacco Use  Smoking Status Former   Current packs/day: 0.00   Types: Cigarettes   Quit date: 07/01/2011   Years since quitting: 12.8  Smokeless Tobacco Former   Quit date: 2012    Allergies  Allergen Reactions   Shrimp [Shellfish Allergy] Anaphylaxis    Lobster, too   Cefdinir Other (See Comments)    Oral ulcer, mild.    Objective:  There were no vitals filed for this visit. There is no height or weight on file to calculate BMI. Constitutional Well developed. Well nourished.  Vascular Foot warm and well perfused.  DP and PT pulses palpable 2/4. Capillary refill normal to all digits.  No calf swelling, no pain with calf squeeze. Mild redness dorsal 1st toe right foot.  Neurologic Normal speech. Oriented to person, place, and time. Light touch sensation decreased to the toes.  Protective sensation decreased.  Vibratory sensation decreased.  Dermatologic Right first toe and MPJ surgical site noted.  MPJ portion of the incision appears well-healed.  Area of dehiscence at the right first toe interphalangeal joint site seems to be improving and progressing.  Measures about 1.3 x 0.3 x 0.3 cm today with mixed fibrogranular tissue.  Still some superficial healing occurring lateral aspect of this incision as well.  Decreasing redness about the toe.  Plantar medial interphalangeal joint ulceration first toe ulceration not fully healed yet,  appears to be partial-thickness skin breakdown about 0.4 cm in diameter.  Orthopedic: Tenderness to palpation noted about the surgical site.    Radiographs: Right foot 3 views 05/19/2024 Surgical changes seen status post first toe interphalangeal joint arthroplasty.  No soft tissue emphysema.  No evidence of osteolysis or cortical erosion.  Evidence of chronic pedal deformity as noted previously.  First metatarsal head status post cheilectomy present.  Postsurgical changes appearing stable from  previous. Assessment:   1. Acquired deformity of right toe   2. Post-operative state   3. Wound dehiscence    Plan:  Patient was evaluated and treated and all questions answered.  S/p foot surgery left first toe interphalangeal joint arthroplasty, first metatarsal head cheilectomy, plantar medial interphalangeal joint ulceration debridement - Today did perform excisional debridement of the dehiscence site using #15 blade and curette of nonviable skin subcutaneous tissue, postdebridement measurements approximately 1.5 x 0.4 x 0.3 cm.  Bleeding wound margins bleeding wound base.  Hemostasis achieved with compression.  Approximately 2 cc of blood loss.  The plantar ulceration required minimal debridement and overlying bioburden and callus was removed. -WB Status: Weightbearing as tolerated in surgical shoe -Medications: Does have pain medication available, he has taken very little of it due to concern for dependence.  Continue ice and elevation.  No further antibiotics at this point. - Gapping portion of the incision was cleansed with wound cleanser.  Dabbed with Betadine.  Wet-to-dry gauze dressing applied.  Continue daily Betadine wet-to-dry dressing changes.  He may also use wound cleanser wet-to-dry or saline wet to dry. - Reviewed dressing changes with patient, change daily. - Monitor for signs of worsening infection.  Contact office immediately if this develops. - Plan to continue with local wound  care.  Does seem to be progressing at this point.  Return in about 1 week (around 05/26/2024) for Wound Care.

## 2024-05-19 NOTE — Patient Instructions (Signed)

## 2024-05-21 ENCOUNTER — Encounter: Payer: Self-pay | Admitting: Podiatry

## 2024-05-26 ENCOUNTER — Ambulatory Visit: Admitting: Podiatry

## 2024-05-26 ENCOUNTER — Encounter: Payer: Self-pay | Admitting: Podiatry

## 2024-05-26 DIAGNOSIS — G5791 Unspecified mononeuropathy of right lower limb: Secondary | ICD-10-CM

## 2024-05-26 DIAGNOSIS — T8130XA Disruption of wound, unspecified, initial encounter: Secondary | ICD-10-CM

## 2024-05-26 DIAGNOSIS — M2061 Acquired deformities of toe(s), unspecified, right foot: Secondary | ICD-10-CM

## 2024-05-26 MED ORDER — GABAPENTIN 100 MG PO CAPS: 100.0000 mg | ORAL_CAPSULE | Freq: Every day | ORAL | 0 refills | Status: AC

## 2024-05-26 NOTE — Progress Notes (Unsigned)
 Subjective:  Patient ID: Jon Shannon, male    DOB: 11-Nov-1965,  MRN: [id number]  Chief Complaint  Patient presents with   Routine Post Op    Right foot, PO DOS 04/16/24 Looking some better, not as swollen, but he did report nerve pain. Soaked in betadine.     DOS: 04/16/2024 Procedure: Right first toe interphalangeal joint arthroplasty, right first metatarsal head cheilectomy  58 y.o. male returns for post-op check.  He has been keeping up with wound care using the betadine wet to dry dressings to the dorsal incision dehiscence site.  He does feel it is doing a bit better.  Does report some intermittent episodes of pain.  He describes it as nerve pain and notices it at nighttime especially.  No evidence of recurrence to the right first toe ulcer site.  Review of Systems: Negative except as noted in the HPI. Denies N/V/F/Ch.  Past Medical History:  Diagnosis Date   Anxiety    Depression    Diabetes (HCC)    type II from chemotherapy   DJD (degenerative joint disease)    Elevated serum creatinine 06/15/2014   Fracture    every metatarsal in R foot, and 2nd metatarsal in L foot while walking   Hepatitis A    resolved.    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 , Disp: , Rfl:    bisoprolol-hydrochlorothiazide (ZIAC) 5-6.25 MG tablet, Take 1 tablet by mouth daily., Disp: , Rfl:     Chromium Picolinate (CHROMIUM PICOLATE PO), Take 2 tablets by mouth 2 (two) times daily. , Disp: , Rfl:    CINNAMON PO, Take 2 capsules by mouth 2 (two) times daily. , Disp: , Rfl:    diclofenac (VOLTAREN) 75 MG EC tablet, Take 75 mg by mouth 2 (two) times daily., Disp: , Rfl:    escitalopram (LEXAPRO) 20 MG tablet, Take 20 mg by mouth daily. , Disp: , Rfl: 0   fenofibrate 160 MG tablet, Take 160 mg by mouth at bedtime., Disp: , Rfl:    fish oil-omega-3 fatty acids 1000 MG capsule, Take 2 g by mouth 2 (two) times daily. Two capsules BID, Disp: , Rfl:    gabapentin (NEURONTIN) 100 MG capsule, Take 1 capsule (100 mg total) by mouth at bedtime for 7 doses., Disp: 7 capsule, Rfl: 0   Galcanezumab -gnlm (EMGALITY ) 120 MG/ML SOAJ, Inject 120 mg into the skin every 30 (thirty) days., Disp: 1 mL, Rfl: 11   GARLIC OIL PO, Take 2 tablets by mouth daily. , Disp: , Rfl:    ibuprofen  (ADVIL ) 600 MG tablet, Take 1 tablet (600 mg total) by mouth every 8 (eight) hours as needed., Disp: 30 tablet, Rfl: 0   INVOKAMET [phone] MG TABS, Take 1 tablet by mouth daily., Disp: , Rfl:    lisinopril (PRINIVIL,ZESTRIL) 5 MG  tablet, Take 5 mg by mouth daily., Disp: , Rfl:    loratadine (CLARITIN) 10 MG tablet, Take 20 mg by mouth daily., Disp: , Rfl:    MELATONIN PO, Take by mouth., Disp: , Rfl:    metoprolol succinate (TOPROL-XL) 50 MG 24 hr tablet, Take by mouth daily. , Disp: , Rfl: 0   oxyCODONE  (OXY IR/ROXICODONE ) 5 MG immediate release tablet, Take 1 tablet (5 mg total) by mouth every 6 (six) hours as needed for up to 20 doses for severe pain (pain score 7-10)., Disp: 20 tablet, Rfl: 0   OZEMPIC, 1 MG/DOSE, 4 MG/3ML SOPN, Inject 1 mg into the skin once a week., Disp: , Rfl:    pravastatin (PRAVACHOL) 40 MG tablet, Take 40 mg by mouth daily., Disp: , Rfl:    silver  sulfADIAZINE  (SILVADENE ) 1 % cream, Apply 1 Application topically daily., Disp: 50 g, Rfl: 0   tamsulosin (FLOMAX) 0.4 MG CAPS capsule, Take 0.4 mg by  mouth., Disp: , Rfl:    traZODone (DESYREL) 50 MG tablet, TAKE 1-2 TABLETS AT BEDTIME AS NEEDED FOR SLEEP, Disp: , Rfl: 5   TRIUMEQ  600-50-300 MG tablet, TAKE 1 TABLET BY MOUTH DAILY WITH BREAKFAST, Disp: 90 tablet, Rfl: 3   UNABLE TO FIND, at bedtime. Med Name: Blood Sugar Support, Disp: , Rfl:    vitamin C (ASCORBIC ACID) 500 MG tablet, Take 1,000 mg by mouth 2 (two) times daily. , Disp: , Rfl:    VITAMIN D PO, Take 2 tablets by mouth 2 (two) times daily., Disp: , Rfl:    XIGDUO XR 11-998 MG TB24, Take 2 tablets by mouth every morning., Disp: , Rfl:   Social History   Tobacco Use  Smoking Status Former   Current packs/day: 0.00   Types: Cigarettes   Quit date: 07/01/2011   Years since quitting: 12.9  Smokeless Tobacco Former   Quit date: 2012    Allergies  Allergen Reactions   Shrimp [Shellfish Allergy] Anaphylaxis    Lobster, too   Cefdinir Other (See Comments)    Oral ulcer, mild.    Objective:  There were no vitals filed for this visit. There is no height or weight on file to calculate BMI. Constitutional Well developed. Well nourished.  Vascular Foot warm and well perfused.  DP and PT pulses palpable 2/4. Capillary refill normal to all digits.  No calf swelling, no pain with calf squeeze. Mild redness dorsal 1st toe right foot.  Neurologic Normal speech. Oriented to person, place, and time. Light touch sensation decreased to the toes.  Protective sensation decreased.  Vibratory sensation decreased.  Subjective neuritis symptoms present about surgical site  Dermatologic Right first toe and MPJ surgical site noted.  MPJ portion of the incision appears well-healed.  Area of dehiscence at the right first toe interphalangeal joint site seems to be improving and progressing.  This appears superficial at this point with dermal tissue involvement.  Central area of the interphalangeal joint incision is healed, still some breakdown present medial and lateral to this.  Plantar medial  interphalangeal joint ulceration first toe callused over  Orthopedic: Tenderness to palpation noted about the surgical site.    Radiographs: Right foot 3 views 05/19/2024 Surgical changes seen status post first toe interphalangeal joint arthroplasty.  No soft tissue emphysema.  No evidence of osteolysis or cortical erosion.  Evidence of chronic pedal deformity as noted previously.  First metatarsal head status post cheilectomy present.  Postsurgical changes appearing stable from previous. Assessment:  1. Acquired deformity of right toe   2. Wound dehiscence   3. Neuritis of right foot    Plan:  Patient was evaluated and treated and all questions answered.  S/p foot surgery left first toe interphalangeal joint arthroplasty, first metatarsal head cheilectomy, plantar medial interphalangeal joint ulceration debridement - The area of delayed healing to the incision did not require significant debridement today.  Did lightly debride some overlying scab and fibrotic slough using #15 blade without incident.  Betadine wet-to-dry dressing applied. -WB Status: Weightbearing as tolerated in surgical shoe -Medications: Will trial course of gabapentin 100 mg at nighttime for neuritis symptoms, can increase if needed at next follow-up - Continue with wet-to-dry dressings.  Patient may use Betadine, wound cleanser or normal sterile saline as appropriate. - Reviewed dressing changes with patient, change daily. - Monitor for signs of worsening infection.  Contact office immediately if this develops. - Plan to continue with local wound care.  Does seem to be progressing at this point.  Follow-up in approximately 1 week.  No follow-ups on file.

## 2024-06-02 ENCOUNTER — Encounter: Payer: Self-pay | Admitting: Podiatry

## 2024-06-02 ENCOUNTER — Ambulatory Visit (INDEPENDENT_AMBULATORY_CARE_PROVIDER_SITE_OTHER): Admitting: Podiatry

## 2024-06-02 DIAGNOSIS — M2061 Acquired deformities of toe(s), unspecified, right foot: Secondary | ICD-10-CM

## 2024-06-02 DIAGNOSIS — Z9889 Other specified postprocedural states: Secondary | ICD-10-CM

## 2024-06-02 NOTE — Progress Notes (Unsigned)
 Subjective:  Patient ID: Jon Shannon, male    DOB: 1966-01-09,  MRN: [id number]  Chief Complaint  Patient presents with   Routine Post Op    DOS 04/16/24 Right hallux, pain is in the pad of the toe. Toe is still red/pink. He did have it wrapped and covered today and is in his po shoe.     DOS: 04/16/2024 Procedure: Right first toe interphalangeal joint arthroplasty, right first metatarsal head cheilectomy  58 y.o. male returns for post-op check.  He has been keeping up with wound care using the betadine wet to dry dressings to the dorsal incision dehiscence site.  He does feel it is doing a bit better.  Does report some intermittent episodes of pain.  He found the gabapentin to be helpful for the nerve pain however he is concerned about sedative effects in combination with his trazodone.  Review of Systems: Negative except as noted in the HPI. Denies N/V/F/Ch.  Past Medical History:  Diagnosis Date   Anxiety    Depression    Diabetes (HCC)    type II from chemotherapy   DJD (degenerative joint disease)    Elevated serum creatinine 06/15/2014   Fracture    every metatarsal in R foot, and 2nd metatarsal in L foot while walking   Hepatitis A    resolved.    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 , Disp: , Rfl:    bisoprolol-hydrochlorothiazide (ZIAC) 5-6.25 MG tablet, Take 1  tablet by mouth daily., Disp: , Rfl:    Chromium Picolinate (CHROMIUM PICOLATE PO), Take 2 tablets by mouth 2 (two) times daily. , Disp: , Rfl:    CINNAMON PO, Take 2 capsules by mouth 2 (two) times daily. , Disp: , Rfl:    diclofenac (VOLTAREN) 75 MG EC tablet, Take 75 mg by mouth 2 (two) times daily., Disp: , Rfl:    escitalopram (LEXAPRO) 20 MG tablet, Take 20 mg by mouth daily. , Disp: , Rfl: 0   fenofibrate 160 MG tablet, Take 160 mg by mouth at bedtime., Disp: , Rfl:    fish oil-omega-3 fatty acids 1000 MG capsule, Take 2 g by mouth 2 (two) times daily. Two capsules BID, Disp: , Rfl:    gabapentin (NEURONTIN) 100 MG capsule, Take 1 capsule (100 mg total) by mouth at bedtime for 7 doses., Disp: 7 capsule, Rfl: 0   Galcanezumab -gnlm (EMGALITY ) 120 MG/ML SOAJ, Inject 120 mg into the skin every 30 (thirty) days., Disp: 1 mL, Rfl: 11   GARLIC OIL PO, Take 2 tablets by mouth daily. , Disp: , Rfl:    ibuprofen  (ADVIL ) 600 MG tablet, Take 1 tablet (600 mg total) by mouth every 8 (eight) hours as needed., Disp: 30 tablet, Rfl: 0   INVOKAMET [phone] MG TABS, Take 1 tablet by mouth daily., Disp: ,  Rfl:    lisinopril (PRINIVIL,ZESTRIL) 5 MG tablet, Take 5 mg by mouth daily., Disp: , Rfl:    loratadine (CLARITIN) 10 MG tablet, Take 20 mg by mouth daily., Disp: , Rfl:    MELATONIN PO, Take by mouth., Disp: , Rfl:    metoprolol succinate (TOPROL-XL) 50 MG 24 hr tablet, Take by mouth daily. , Disp: , Rfl: 0   oxyCODONE  (OXY IR/ROXICODONE ) 5 MG immediate release tablet, Take 1 tablet (5 mg total) by mouth every 6 (six) hours as needed for up to 20 doses for severe pain (pain score 7-10)., Disp: 20 tablet, Rfl: 0   OZEMPIC, 1 MG/DOSE, 4 MG/3ML SOPN, Inject 1 mg into the skin once a week., Disp: , Rfl:    pravastatin (PRAVACHOL) 40 MG tablet, Take 40 mg by mouth daily., Disp: , Rfl:    silver  sulfADIAZINE  (SILVADENE ) 1 % cream, Apply 1 Application topically daily., Disp: 50 g, Rfl: 0   tamsulosin (FLOMAX)  0.4 MG CAPS capsule, Take 0.4 mg by mouth., Disp: , Rfl:    traZODone (DESYREL) 50 MG tablet, TAKE 1-2 TABLETS AT BEDTIME AS NEEDED FOR SLEEP, Disp: , Rfl: 5   TRIUMEQ  600-50-300 MG tablet, TAKE 1 TABLET BY MOUTH DAILY WITH BREAKFAST, Disp: 90 tablet, Rfl: 3   UNABLE TO FIND, at bedtime. Med Name: Blood Sugar Support, Disp: , Rfl:    vitamin C (ASCORBIC ACID) 500 MG tablet, Take 1,000 mg by mouth 2 (two) times daily. , Disp: , Rfl:    VITAMIN D PO, Take 2 tablets by mouth 2 (two) times daily., Disp: , Rfl:    XIGDUO XR 11-998 MG TB24, Take 2 tablets by mouth every morning., Disp: , Rfl:   Social History   Tobacco Use  Smoking Status Former   Current packs/day: 0.00   Types: Cigarettes   Quit date: 07/01/2011   Years since quitting: 12.9  Smokeless Tobacco Former   Quit date: 2012    Allergies  Allergen Reactions   Shrimp [Shellfish Allergy] Anaphylaxis    Lobster, too   Cefdinir Other (See Comments)    Oral ulcer, mild.    Objective:  There were no vitals filed for this visit. There is no height or weight on file to calculate BMI. Constitutional Well developed. Well nourished.  Vascular Foot warm and well perfused.  DP and PT pulses palpable 2/4. Capillary refill normal to all digits.  No calf swelling, no pain with calf squeeze. Mild redness dorsal 1st toe right foot.  Neurologic Normal speech. Oriented to person, place, and time. Light touch sensation decreased to the toes.  Protective sensation decreased.  Vibratory sensation decreased.  Subjective neuritis symptoms present about surgical site  Dermatologic Right first toe and MPJ surgical site noted.  MPJ portion of the incision appears well-healed.  Area of dehiscence at the right first toe interphalangeal joint site seems to be improving and progressing.  This appears superficial at this point and is partial-thickness.  Nearly fully healed at this point mostly medial portion of the interphalangeal joint incision.  Plantar  medial interphalangeal joint ulceration first toe callused over  Orthopedic: Tenderness to palpation noted about the surgical site.    Radiographs: Right foot 3 views 05/19/2024 Surgical changes seen status post first toe interphalangeal joint arthroplasty.  No soft tissue emphysema.  No evidence of osteolysis or cortical erosion.  Evidence of chronic pedal deformity as noted previously.  First metatarsal head status post cheilectomy present.  Postsurgical changes appearing stable from previous.  Assessment:   1. Acquired deformity of right toe   2. Post-operative state    Plan:  Patient was evaluated and treated and all questions answered.  S/p foot surgery left first toe interphalangeal joint arthroplasty, first metatarsal head cheilectomy, plantar medial interphalangeal joint ulceration debridement - The area of delayed healing to the incision did not require significant debridement today.  Did lightly debride some overlying scab and fibrotic slough using #15 blade without incident.  Bandage applied -WB Status: Gradual return to regular shoe gear with light activity over the next 2 weeks. - At this point can use small amount mupirocin and Band-Aid over the open portion of delayed healing.  Does appear nearly healed at this point. - Reviewed dressing changes with patient, change daily. - Monitor for signs of worsening infection.  Contact office immediately if this develops. - Plan to continue with local wound care.  Does seem to be progressing at this point.    Return in about 2 weeks (around 06/16/2024) for Post Op Check.

## 2024-06-09 ENCOUNTER — Encounter: Admitting: Podiatry

## 2024-06-16 DIAGNOSIS — S83511A Sprain of anterior cruciate ligament of right knee, initial encounter: Secondary | ICD-10-CM | POA: Diagnosis not present

## 2024-06-17 ENCOUNTER — Encounter: Payer: Self-pay | Admitting: Podiatry

## 2024-06-17 ENCOUNTER — Ambulatory Visit (INDEPENDENT_AMBULATORY_CARE_PROVIDER_SITE_OTHER): Admitting: Podiatry

## 2024-06-17 DIAGNOSIS — M205X1 Other deformities of toe(s) (acquired), right foot: Secondary | ICD-10-CM

## 2024-06-17 DIAGNOSIS — Z9889 Other specified postprocedural states: Secondary | ICD-10-CM

## 2024-06-17 DIAGNOSIS — E1140 Type 2 diabetes mellitus with diabetic neuropathy, unspecified: Secondary | ICD-10-CM

## 2024-06-17 NOTE — Progress Notes (Signed)
  Subjective:  Patient ID: Jon Shannon, male    DOB: 1966-03-09,  MRN: [id number]  Chief Complaint  Patient presents with   Routine Post Op    DOS 04/16/24 RT 1ST TOE INTERPHALANGEAL JOINT ARTHROPLASTY, CHEILECTOMY AND WOUND DEBRIDEMENT    Discussed the use of AI scribe software for clinical note transcription with the patient, who gave verbal consent to proceed.  History of Present Illness Date of surgery: 04/16/2024 right foot first toe interphalangeal joint arthroplasty, cheilectomy and wound debridement  Jon Shannon is a 58 year old male with diabetic neuropathy who presents for follow-up of above-stated procedures and now healed toe ulcer.  His toe ulcer has healed well, with the scab having fallen off, and he has noticed improved mobility in the toe.  The surgical site appears well-healed at this point, he did have some dehiscence of the incision at the first toe interphalangeal joint level.  He experiences neuropathic pain in his big toe, described as a 'furrow pepper' sensation. Diabetic neuropathy affects his ability to feel sensations on the bottom of his foot, leading to incidents such as stepping on pen nails without realizing it. He often goes barefoot at home, increasing his risk of injury.      Objective:    Physical Exam EXTREMITIES: Status post right first toe arthroplasty with colectomy of the first metatarsal head; incision well healed. Pedal pulses palpable bilaterally. First MTP joint dorsiflexion approximately 45 degrees. No cyanosis or edema.  Pes planus foot type. Decreased protective and light touch sensation noted bilaterally.   No images are attached to the encounter.    Results    Assessment:   1. Post-operative state   2. Hallux limitus of right foot   3. Type 2 diabetes mellitus with diabetic neuropathy, unspecified whether long term insulin use (HCC)      Plan:  Patient was evaluated and treated and all questions answered.  Assessment  and Plan Assessment & Plan Status post right first toe arthroplasty with healed ulcer and chronic right foot deformity Surgical site healed, ulcer resolved, improved toe mobility, no signs of ulcer recurrence - Apply vitamin E oil to scar for remodeling. - Protect scar from sun exposure. - Encourage toe mobility to prevent ulcer recurrence. - Monitor for ulcer recurrence or inflammation. - No additional bandages needed; use sock and shoe.  Type 2 diabetes mellitus with diabetic neuropathy Diabetic neuropathy present, decreased foot sensation, risk of unnoticed injuries. - Perform daily foot checks, especially soles. - Avoid walking barefoot. - Use hand mirror for foot checks. - Schedule foot checks every six months. - Seek medical attention for new wounds or inflammation.      Return in about 6 months (around 12/15/2024) for Diabetic foot check.

## 2024-06-19 DIAGNOSIS — Z01818 Encounter for other preprocedural examination: Secondary | ICD-10-CM | POA: Diagnosis not present

## 2024-06-19 DIAGNOSIS — M79609 Pain in unspecified limb: Secondary | ICD-10-CM | POA: Diagnosis not present

## 2024-06-19 DIAGNOSIS — E559 Vitamin D deficiency, unspecified: Secondary | ICD-10-CM | POA: Diagnosis not present

## 2024-06-19 DIAGNOSIS — Z79899 Other long term (current) drug therapy: Secondary | ICD-10-CM | POA: Diagnosis not present

## 2024-06-20 DIAGNOSIS — Z01818 Encounter for other preprocedural examination: Secondary | ICD-10-CM | POA: Diagnosis not present

## 2024-06-23 DIAGNOSIS — Z01818 Encounter for other preprocedural examination: Secondary | ICD-10-CM | POA: Diagnosis not present

## 2024-06-23 DIAGNOSIS — Z6836 Body mass index (BMI) 36.0-36.9, adult: Secondary | ICD-10-CM | POA: Diagnosis not present

## 2024-06-23 DIAGNOSIS — H1030 Unspecified acute conjunctivitis, unspecified eye: Secondary | ICD-10-CM | POA: Diagnosis not present

## 2024-06-23 DIAGNOSIS — R9431 Abnormal electrocardiogram [ECG] [EKG]: Secondary | ICD-10-CM | POA: Diagnosis not present

## 2024-07-01 ENCOUNTER — Ambulatory Visit: Admitting: Infectious Diseases

## 2024-07-01 VITALS — BP 131/81 | HR 64 | Temp 98.7°F | Ht 72.0 in | Wt 263.2 lb

## 2024-07-01 DIAGNOSIS — Z8571 Personal history of Hodgkin lymphoma: Secondary | ICD-10-CM | POA: Diagnosis not present

## 2024-07-01 DIAGNOSIS — E11621 Type 2 diabetes mellitus with foot ulcer: Secondary | ICD-10-CM | POA: Diagnosis not present

## 2024-07-01 DIAGNOSIS — I10 Essential (primary) hypertension: Secondary | ICD-10-CM | POA: Diagnosis not present

## 2024-07-01 DIAGNOSIS — E1140 Type 2 diabetes mellitus with diabetic neuropathy, unspecified: Secondary | ICD-10-CM | POA: Diagnosis not present

## 2024-07-01 DIAGNOSIS — Z113 Encounter for screening for infections with a predominantly sexual mode of transmission: Secondary | ICD-10-CM

## 2024-07-01 DIAGNOSIS — L97519 Non-pressure chronic ulcer of other part of right foot with unspecified severity: Secondary | ICD-10-CM | POA: Diagnosis not present

## 2024-07-01 DIAGNOSIS — Z79899 Other long term (current) drug therapy: Secondary | ICD-10-CM

## 2024-07-01 DIAGNOSIS — B20 Human immunodeficiency virus [HIV] disease: Secondary | ICD-10-CM | POA: Diagnosis not present

## 2024-07-01 DIAGNOSIS — M1711 Unilateral primary osteoarthritis, right knee: Secondary | ICD-10-CM | POA: Diagnosis not present

## 2024-07-01 NOTE — Assessment & Plan Note (Signed)
 Yearly f/u with Onc.

## 2024-07-01 NOTE — Assessment & Plan Note (Addendum)
 Fairly well controlled. Could have better SBP Appreciate PCP f/u.   Add- He asks that I review his ECG- there are no acute changes, NSR 69 bpm. There is evidence of old septal infarct, I do not have documentation of this. I asked him to f/u with his PCP.

## 2024-07-01 NOTE — Assessment & Plan Note (Signed)
 He is doing well We discussed changing him off triumeq , he wants to continue till it is not working anymore.  He is on statin.  His labs look great.  His vax are uptodate- has gotten Flu and covid and pneumonia all at [facility] 04-30-24 Rtc in 9 months

## 2024-07-01 NOTE — Progress Notes (Signed)
 Subjective:    Patient ID: Jon Shannon, male  DOB: 1966-02-23, 58 y.o.        MRN: [id number]   HPI 58 yo M with HIV+, previous stage IIa Hodgkin's lymphoma (2013), tx with ABVD 10-2011 with resultant neuropathy. He had onc f/u May 2021, clear.  Has DM2 as well from steroids. Severe hypertriglyceridemia. Has PCP in [place], [place] .  Taking 4g fish oil, fenofibrate, and pravastatin. red rice yeast.  On triumeq .  Has yearly f/u with onc.    Has 2 new puppies, doing well. Very rambunctious.  No problems with ART.  Single.    Has fracture of R knee. Is wearing brace.  He had cellulitis 1 month ago. Had repeat episode and asked for clinda, took 7 days. Worsened, extended proximally as well as LE edema. Temp up to 103.9. He was given IM (ceftr?) and then 7 more days of clinda.  He had doppler done last week that was (-). At [place].  Applied neosporin and wrapped today.  Has wound on sole of R great toe, non-healing for several months.  R TKR planned for January.  He was seen by podiatry and had R great toe surgery. His wound has resolved as has his recurrent cellulitis.   Back to gym yesterday.  Saw dental this AM. Needs 2 extractions, oral surgery due to prev XRT.  He has been started on ozempeic. Wants to get to 195-220 Wt has been stable.  FSG 116 this am. Highest 188 in last month.  Last A1C 6.2% Saw optho this yeara, has pterygium, beginning of cataract      HIV 1 RNA Quant (Copies/mL)  Date Value  01/05/2022 Not Detected  07/19/2021 Not Detected  10/04/2020 Not Detected   HIV-1 RNA Viral Load (copies/mL)  Date Value  02/06/2024 <20  08/08/2023 80  08/16/2022 <20   CD4 T Cell Abs (/uL)  Date Value  02/06/2024 696  08/08/2023 684  08/16/2022 837     Health Maintenance  Topic Date Due   Hepatitis B Vaccines 19-59 Average Risk (1 of 3 - 19+ 3-dose series) Never done   FOOT EXAM  09/20/2018   HEMOGLOBIN A1C  08/08/2024   COVID-19 Vaccine (10 - Moderna  risk 2025-26 season) 10/29/2024   OPHTHALMOLOGY EXAM  12/11/2024   Colonoscopy  05/02/2025   DTaP/Tdap/Td (4 - Td or Tdap) 12/20/2031   Pneumococcal Vaccine: 50+ Years  Completed   Influenza Vaccine  Completed   Hepatitis C Screening  Completed   HIV Screening  Completed   Zoster Vaccines- Shingrix  Completed   HPV VACCINES  Aged Out   Meningococcal B Vaccine  Aged Out    Review of Systems  Constitutional:  Negative for chills, fever and weight loss.  Respiratory:  Negative for cough and shortness of breath.   Gastrointestinal:  Negative for constipation and diarrhea (occas at night, loose).  Genitourinary:  Positive for urgency. Negative for dysuria.  Musculoskeletal:  Positive for joint pain.  Neurological:  Positive for sensory change.    Please see HPI. All other systems reviewed and negative.     Objective:  Physical Exam Vitals reviewed.  Constitutional:      Appearance: Normal appearance. He is obese.  HENT:     Mouth/Throat:     Mouth: Mucous membranes are dry.  Eyes:     Extraocular Movements: Extraocular movements intact.     Pupils: Pupils are equal, round, and reactive to light.  Cardiovascular:  Rate and Rhythm: Normal rate and regular rhythm.  Pulmonary:     Effort: Pulmonary effort is normal.     Breath sounds: Normal breath sounds.  Abdominal:     General: Bowel sounds are normal. There is no distension.     Palpations: Abdomen is soft.     Tenderness: There is no abdominal tenderness.  Musculoskeletal:     Cervical back: Normal range of motion and neck supple.     Right lower leg: No edema.     Left lower leg: No edema.  Neurological:     Mental Status: He is alert.     Sensory: Sensory deficit present.            Assessment & Plan:

## 2024-07-01 NOTE — Assessment & Plan Note (Signed)
 Resolved, appreciate podiatry f/u.

## 2024-07-01 NOTE — Assessment & Plan Note (Signed)
 He is doing well Appreciate pcp f/u Continue wt loss

## 2024-07-01 NOTE — Assessment & Plan Note (Signed)
 For R TKR next month He is medically optimized from my perspective (CD4 normal, undetectable)

## 2024-08-12 ENCOUNTER — Encounter: Payer: Self-pay | Admitting: Infectious Diseases

## 2024-09-08 ENCOUNTER — Ambulatory Visit

## 2024-12-16 ENCOUNTER — Ambulatory Visit: Admitting: Podiatry

## 2025-03-12 ENCOUNTER — Telehealth: Admitting: Adult Health
